# Patient Record
Sex: Female | Born: 1955 | Race: Black or African American | Hispanic: No | Smoking: Former smoker
Health system: Southern US, Community
[De-identification: ages and names within clinical notes are randomized; demographics above are authoritative.]

## PROBLEM LIST (undated history)

## (undated) DIAGNOSIS — E559 Vitamin D deficiency, unspecified: Secondary | ICD-10-CM

## (undated) DIAGNOSIS — K5790 Diverticulosis of intestine, part unspecified, without perforation or abscess without bleeding: Secondary | ICD-10-CM

## (undated) DIAGNOSIS — B009 Herpesviral infection, unspecified: Secondary | ICD-10-CM

## (undated) DIAGNOSIS — E785 Hyperlipidemia, unspecified: Secondary | ICD-10-CM

## (undated) DIAGNOSIS — IMO0001 Reserved for inherently not codable concepts without codable children: Secondary | ICD-10-CM

## (undated) DIAGNOSIS — Z973 Presence of spectacles and contact lenses: Secondary | ICD-10-CM

## (undated) DIAGNOSIS — I10 Essential (primary) hypertension: Secondary | ICD-10-CM

## (undated) DIAGNOSIS — R3129 Other microscopic hematuria: Secondary | ICD-10-CM

## (undated) DIAGNOSIS — Z90710 Acquired absence of both cervix and uterus: Secondary | ICD-10-CM

## (undated) DIAGNOSIS — G479 Sleep disorder, unspecified: Secondary | ICD-10-CM

## (undated) DIAGNOSIS — N959 Unspecified menopausal and perimenopausal disorder: Secondary | ICD-10-CM

## (undated) DIAGNOSIS — R0602 Shortness of breath: Secondary | ICD-10-CM

## (undated) DIAGNOSIS — K5909 Other constipation: Secondary | ICD-10-CM

## (undated) DIAGNOSIS — Z9289 Personal history of other medical treatment: Secondary | ICD-10-CM

## (undated) DIAGNOSIS — Z87891 Personal history of nicotine dependence: Secondary | ICD-10-CM

## (undated) HISTORY — DX: Hyperlipidemia, unspecified: E78.5

## (undated) HISTORY — DX: Presence of spectacles and contact lenses: Z97.3

## (undated) HISTORY — DX: Unspecified menopausal and perimenopausal disorder: N95.9

## (undated) HISTORY — DX: Sleep disorder, unspecified: G47.9

## (undated) HISTORY — DX: Personal history of other medical treatment: Z92.89

## (undated) HISTORY — DX: Other microscopic hematuria: R31.29

## (undated) HISTORY — DX: Herpesviral infection, unspecified: B00.9

## (undated) HISTORY — DX: Vitamin D deficiency, unspecified: E55.9

## (undated) HISTORY — DX: Personal history of nicotine dependence: Z87.891

## (undated) HISTORY — DX: Other constipation: K59.09

## (undated) HISTORY — DX: Reserved for inherently not codable concepts without codable children: IMO0001

## (undated) HISTORY — DX: Acquired absence of both cervix and uterus: Z90.710

## (undated) HISTORY — DX: Shortness of breath: R06.02

## (undated) HISTORY — DX: Diverticulosis of intestine, part unspecified, without perforation or abscess without bleeding: K57.90

---

## 1987-08-20 HISTORY — PX: PARTIAL HYSTERECTOMY: SHX80

## 1988-08-19 HISTORY — PX: ECTOPIC PREGNANCY SURGERY: SHX613

## 1998-09-27 ENCOUNTER — Ambulatory Visit (HOSPITAL_COMMUNITY): Admission: RE | Admit: 1998-09-27 | Discharge: 1998-09-27 | Payer: Self-pay | Admitting: Gastroenterology

## 1998-09-27 LAB — HM COLONOSCOPY

## 1998-10-04 ENCOUNTER — Encounter: Payer: Self-pay | Admitting: Family Medicine

## 1998-10-04 ENCOUNTER — Ambulatory Visit (HOSPITAL_COMMUNITY): Admission: RE | Admit: 1998-10-04 | Discharge: 1998-10-04 | Payer: Self-pay | Admitting: Family Medicine

## 1999-10-01 ENCOUNTER — Encounter: Payer: Self-pay | Admitting: Family Medicine

## 1999-10-01 ENCOUNTER — Encounter: Admission: RE | Admit: 1999-10-01 | Discharge: 1999-10-01 | Payer: Self-pay | Admitting: Family Medicine

## 1999-11-06 ENCOUNTER — Other Ambulatory Visit: Admission: RE | Admit: 1999-11-06 | Discharge: 1999-11-06 | Payer: Self-pay | Admitting: Urology

## 2000-10-09 ENCOUNTER — Other Ambulatory Visit: Admission: RE | Admit: 2000-10-09 | Discharge: 2000-10-09 | Payer: Self-pay | Admitting: Family Medicine

## 2001-02-28 ENCOUNTER — Emergency Department (HOSPITAL_COMMUNITY): Admission: EM | Admit: 2001-02-28 | Discharge: 2001-02-28 | Payer: Self-pay | Admitting: Emergency Medicine

## 2001-02-28 ENCOUNTER — Encounter: Payer: Self-pay | Admitting: Emergency Medicine

## 2001-10-13 ENCOUNTER — Other Ambulatory Visit: Admission: RE | Admit: 2001-10-13 | Discharge: 2001-10-13 | Payer: Self-pay | Admitting: Family Medicine

## 2002-11-01 ENCOUNTER — Other Ambulatory Visit: Admission: RE | Admit: 2002-11-01 | Discharge: 2002-11-01 | Payer: Self-pay | Admitting: Family Medicine

## 2004-11-27 ENCOUNTER — Ambulatory Visit (HOSPITAL_COMMUNITY): Admission: RE | Admit: 2004-11-27 | Discharge: 2004-11-27 | Payer: Self-pay | Admitting: Gastroenterology

## 2005-08-01 ENCOUNTER — Other Ambulatory Visit: Admission: RE | Admit: 2005-08-01 | Discharge: 2005-08-01 | Payer: Self-pay | Admitting: Family Medicine

## 2007-10-11 ENCOUNTER — Emergency Department (HOSPITAL_COMMUNITY): Admission: EM | Admit: 2007-10-11 | Discharge: 2007-10-11 | Payer: Self-pay | Admitting: Family Medicine

## 2007-10-15 ENCOUNTER — Emergency Department (HOSPITAL_COMMUNITY): Admission: EM | Admit: 2007-10-15 | Discharge: 2007-10-15 | Payer: Self-pay | Admitting: Family Medicine

## 2008-10-13 ENCOUNTER — Emergency Department (HOSPITAL_COMMUNITY): Admission: EM | Admit: 2008-10-13 | Discharge: 2008-10-13 | Payer: Self-pay | Admitting: Emergency Medicine

## 2009-04-01 ENCOUNTER — Emergency Department (HOSPITAL_COMMUNITY): Admission: EM | Admit: 2009-04-01 | Discharge: 2009-04-01 | Payer: Self-pay | Admitting: Emergency Medicine

## 2010-11-20 ENCOUNTER — Encounter: Payer: Self-pay | Admitting: Family Medicine

## 2010-11-27 LAB — HM MAMMOGRAPHY: HM Mammogram: NEGATIVE

## 2011-02-28 ENCOUNTER — Encounter: Payer: Self-pay | Admitting: Family Medicine

## 2011-04-01 ENCOUNTER — Encounter: Payer: Self-pay | Admitting: Family Medicine

## 2011-05-06 ENCOUNTER — Encounter: Payer: Self-pay | Admitting: Medical

## 2011-05-07 ENCOUNTER — Encounter: Payer: Self-pay | Admitting: Medical

## 2011-05-07 ENCOUNTER — Ambulatory Visit (INDEPENDENT_AMBULATORY_CARE_PROVIDER_SITE_OTHER): Payer: BC Managed Care – PPO | Admitting: Medical

## 2011-05-07 DIAGNOSIS — E785 Hyperlipidemia, unspecified: Secondary | ICD-10-CM

## 2011-05-07 DIAGNOSIS — B009 Herpesviral infection, unspecified: Secondary | ICD-10-CM

## 2011-05-07 DIAGNOSIS — N959 Unspecified menopausal and perimenopausal disorder: Secondary | ICD-10-CM

## 2011-05-07 DIAGNOSIS — R030 Elevated blood-pressure reading, without diagnosis of hypertension: Secondary | ICD-10-CM

## 2011-05-07 DIAGNOSIS — Z0000 Encounter for general adult medical examination without abnormal findings: Secondary | ICD-10-CM

## 2011-05-07 DIAGNOSIS — F17200 Nicotine dependence, unspecified, uncomplicated: Secondary | ICD-10-CM

## 2011-05-07 LAB — CBC WITH DIFFERENTIAL/PLATELET
Basophils Absolute: 0 10*3/uL (ref 0.0–0.1)
Basophils Relative: 0 % (ref 0–1)
Eosinophils Absolute: 0.4 10*3/uL (ref 0.0–0.7)
Eosinophils Relative: 5 % (ref 0–5)
HCT: 39.8 % (ref 36.0–46.0)
MCH: 28.9 pg (ref 26.0–34.0)
MCHC: 32.9 g/dL (ref 30.0–36.0)
MCV: 87.7 fL (ref 78.0–100.0)
Monocytes Absolute: 0.5 10*3/uL (ref 0.1–1.0)
Platelets: 232 10*3/uL (ref 150–400)
RDW: 13.7 % (ref 11.5–15.5)

## 2011-05-07 LAB — COMPREHENSIVE METABOLIC PANEL
ALT: <8 U/L (ref 0–35)
AST: 12 U/L (ref 0–37)
CO2: 26 mEq/L (ref 19–32)
Chloride: 106 mEq/L (ref 96–112)
Creat: 0.55 mg/dL (ref 0.50–1.10)
Sodium: 141 mEq/L (ref 135–145)
Total Bilirubin: 0.4 mg/dL (ref 0.3–1.2)
Total Protein: 7 g/dL (ref 6.0–8.3)

## 2011-05-07 LAB — POCT URINALYSIS DIPSTICK
Glucose, UA: NEGATIVE
Ketones, UA: NEGATIVE
Leukocytes, UA: NEGATIVE
Spec Grav, UA: 1.01
Urobilinogen, UA: NEGATIVE

## 2011-05-07 LAB — LIPID PANEL
LDL Cholesterol: 141 mg/dL — ABNORMAL HIGH (ref 0–99)
Triglycerides: 65 mg/dL (ref <150)
VLDL: 13 mg/dL (ref 0–40)

## 2011-05-07 LAB — POC HEMOCCULT BLD/STL (OFFICE/1-CARD/DIAGNOSTIC): Fecal Occult Blood, POC: NEGATIVE

## 2011-05-07 MED ORDER — CLONIDINE HCL 0.1 MG PO TABS: 0.1000 mg | ORAL_TABLET | Freq: Every day | ORAL | Status: DC

## 2011-05-07 MED ORDER — VALACYCLOVIR HCL 1 G PO TABS: 500.0000 mg | ORAL_TABLET | Freq: Two times a day (BID) | ORAL | Status: DC

## 2011-05-07 NOTE — Progress Notes (Signed)
Subjective:   HPI  Holly [name] is a 55 y.o. female who presents for a complete physical.  She is a new patient today.  She was last seeing Dr. Sheran Fava before the [facility] practice closed.   She has been out of her medication for months.     She request referral to GI.  Was suppose to have repeat colonoscopy last year.  Her last colonoscopy was normal in 2006 per patient, but says they were going to repeat 2011.  Last was through Dr. Loreta Ave.   In general she has been in her normal state of health.  She was on estrogen patch for menopause.  She has bad hot flashes, worse at night.  She is currently taking some black cohosh OTC.  No other c/o.   Reviewed their medical, surgical, family, social, medication, and allergy history and updated chart as appropriate.  Past Medical History  Diagnosis Date  . Hyperlipidemia   . Wears glasses   . Tobacco use disorder   . Herpetic lesion     hx/o herpetic lesion right hand and left ear?  . Menopausal disorder     Past Surgical History  Procedure Date  . Partial hysterectomy 1989    still has 1 ovary  . Ectopic pregnancy surgery 1990    Family History  Problem Relation Age of Onset  . Hyperlipidemia Mother   . Hypertension Mother   . Hyperlipidemia Father   . Hypertension Father   . Stroke Brother     during surgery  . Cancer Neg Hx   . Heart disease Neg Hx   . Diabetes Neg Hx     History   Social History  . Marital Status: Married    Spouse Name: N/A    Number of Children: N/A  . Years of Education: N/A   Occupational History  . Not on file.   Social History Main Topics  . Smoking status: Current Everyday Smoker -- 0.5 packs/day    Types: Cigarettes  . Smokeless tobacco: Never Used  . Alcohol Use: No  . Drug Use: No  . Sexually Active: Not on file     married, 2 chlidren, school bus driver, exercise with walking   Other Topics Concern  . Not on file   Social History Narrative  . No narrative on file     Current Outpatient Prescriptions on File Prior to Visit  Medication Sig Dispense Refill  . calcium-vitamin D (OSCAL WITH D) 500-200 MG-UNIT per tablet Take 1 tablet by mouth 2 (two) times daily.        . valACYclovir (VALTREX) 1000 MG tablet Take 500 mg by mouth 2 (two) times daily.          Allergies  Allergen Reactions  . Penicillins      Review of Systems Constitutional: +hot flashes, 20lb weight loss in last several months, intentional; denies fever, chills, anorexia, fatigue Allergy: negative; denies recent sneezing, itching, congestion Dermatology: denies changing moles, rash, lumps, new worrisome lesions ENT: no runny nose, ear pain, sore throat, hoarseness, sinus pain, teeth pain, tinnitus, hearing loss, epistaxis Cardiology: denies chest pain, palpitations, edema, orthopnea, paroxysmal nocturnal dyspnea Respiratory: denies cough, shortness of breath, dyspnea on exertion, wheezing, hemoptysis Gastroenterology: +occasional constipation; denies abdominal pain, nausea, vomiting, diarrhea, blood in stool, changes in bowel movement, dysphagia Hematology: denies bleeding or bruising problems Musculoskeletal: denies arthralgias, myalgias, joint swelling, back pain, neck pain, cramping, gait changes Ophthalmology: denies vision changes, eye redness, itching, discharge Urology: denies  dysuria, difficulty urinating, hematuria, urinary frequency, urgency, incontinence Neurology: no headache, weakness, tingling, numbness, speech abnormality, memory loss, falls, dizziness Psychology: denies depressed mood, agitation, sleep problems Gyn: no breast pain, discharge,no vaginal c/o     Objective:   Physical Exam  Filed Vitals:   05/07/11 1041  BP: 132/90  Pulse: 64  Temp: 98 F (36.7 C)  Resp: 20    General appearance: alert, no distress, WD/WN, black female Skin:left external ear with few amorphous scars that pt attributes to herpetic lesion prior, right orbit laterally with  3mm raised brown papule, and right chin with raised 3mm papule, brown, uniform, both unchanged for many years per pt HEENT: normocephalic, conjunctiva/corneas normal, sclerae anicteric, PERRLA, EOMi, nares patent, no discharge or erythema, pharynx normal Oral cavity: MMM, tongue normal, teeth in good repair Neck: supple, no lymphadenopathy, no thyromegaly, no masses, normal ROM, no bruits Chest: non tender, normal shape and expansion Heart: RRR, normal S1, S2, no murmurs Lungs: CTA bilaterally, no wheezes, rhonchi, or rales Abdomen: +bs, soft, non tender, non distended, no masses, no hepatomegaly, no splenomegaly, no bruits Back: non tender, normal ROM, no scoliosis Musculoskeletal: upper extremities non tender, no obvious deformity, normal ROM throughout, lower extremities non tender, no obvious deformity, normal ROM throughout Extremities: no edema, no cyanosis, no clubbing Pulses: 2+ symmetric, upper and lower extremities, normal cap refill Neurological: alert, oriented x 3, CN2-12 intact, strength normal upper extremities and lower extremities, sensation normal throughout, DTRs 2+ throughout, no cerebellar signs, gait normal Psychiatric: normal affect, behavior normal, pleasant  Breasts: nontender, no mass, no skin changes, no axillary lymphadenopathy Gyn: normal female external genitalia, s/p hysterectomy, no adnexal mass or tenderness, no significant atrophic changes; rectal - small external hemorrhoid, otherwise occult brown stool, otherwise unremarkable exam, exam chaperoned by nurse   Assessment :    Encounter Diagnoses  Name Primary?  . General medical examination Yes  . Tobacco use disorder   . Hyperlipidemia   . Elevated blood pressure reading without diagnosis of hypertension   . Herpetic lesion   . Menopausal disorder       Plan:    Physical exam - discussed healthy lifestyle, diet, exercise, preventative care, vaccinations, and addressed their concerns.  Declines flu  shot today.   Last tetanus booster 2009.  Bone density scan 2012, normal normal per pt.   Mammogram 11/27/10 normal.  Will review prior records.   Tobacco use disorder - discussed dangers of smoking, advised she stop tobacco use.  Hyperlipidemia - labs today.  Note, she lost 20 lb intentionally over the past several months and hops that her numbers will be normal so she doesn't have to take medication.   Elevated BP - recheck with nurse in 2wk.  Herpetic lesion - will review prior records.   Menopausal disorder - discussed options.  I advised she avoid hormone patch given risks including her tobacco use.  Will use trial of Clonidine QHS. Recheck 42mo.  Discussed other strategies to deal with menopausal symptoms, hot flashes.  Repeat first morning UA in 2wk.  Reviewed prior records which in additional to notations above, she has hx/o vit D deficiency, chronic hematuria with negative urologic workup, genital herpes, and discoid lupus.

## 2011-05-08 ENCOUNTER — Encounter: Payer: Self-pay | Admitting: *Deleted

## 2011-05-08 ENCOUNTER — Telehealth: Payer: Self-pay | Admitting: *Deleted

## 2011-05-08 ENCOUNTER — Other Ambulatory Visit: Payer: Self-pay | Admitting: Medical

## 2011-05-08 ENCOUNTER — Encounter: Payer: Self-pay | Admitting: Medical

## 2011-05-08 MED ORDER — PRAVASTATIN SODIUM 40 MG PO TABS: 40.0000 mg | ORAL_TABLET | Freq: Every evening | ORAL | Status: DC

## 2011-05-08 MED ORDER — ASPIRIN EC 81 MG PO TBEC: 81.0000 mg | DELAYED_RELEASE_TABLET | Freq: Every day | ORAL | Status: DC

## 2011-05-08 NOTE — Telephone Encounter (Addendum)
Message copied by Dorthula Perfect on Wed May 08, 2011  9:24 AM ------      Message from: Aleen Campi, DAVID S      Created: Wed May 08, 2011  7:35 AM       pls pick up letter to patient on Dr. Delford Field printer.  Stamp my signature.  Please call pt about results as noted on the letter.              Carefully review with her the medication changes noted in the letter (aspirin, cholesterol medication, clonidine, vitamins).   Pt was notified and letter was read to pt.  Pt understood and agreed.   Pt will call back to schedule an appointment for 2 week f/u on ua and 1 month follow up on BP, Hotflashes and to stop smoking.  Pt agreed to take asa, cholesterol medication and clonidine and vitamins.  Send letter to pt.  CM, LPN

## 2011-05-28 ENCOUNTER — Other Ambulatory Visit (INDEPENDENT_AMBULATORY_CARE_PROVIDER_SITE_OTHER): Payer: BC Managed Care – PPO

## 2011-05-28 DIAGNOSIS — N390 Urinary tract infection, site not specified: Secondary | ICD-10-CM

## 2011-05-28 LAB — POCT URINALYSIS DIPSTICK
Bilirubin, UA: NEGATIVE
Blood, UA: NEGATIVE
Glucose, UA: NEGATIVE
Nitrite, UA: NEGATIVE
Spec Grav, UA: 1.015

## 2011-06-17 ENCOUNTER — Ambulatory Visit: Payer: BC Managed Care – PPO | Admitting: Medical

## 2011-07-10 ENCOUNTER — Encounter: Payer: Self-pay | Admitting: Family Medicine

## 2011-07-14 ENCOUNTER — Other Ambulatory Visit: Payer: Self-pay | Admitting: Medical

## 2011-07-16 ENCOUNTER — Other Ambulatory Visit: Payer: Self-pay | Admitting: Medical

## 2012-01-14 ENCOUNTER — Encounter: Payer: Self-pay | Admitting: Medical

## 2012-01-27 ENCOUNTER — Ambulatory Visit
Admission: RE | Admit: 2012-01-27 | Discharge: 2012-01-27 | Disposition: A | Discharge status: Home / Self Care | Payer: BC Managed Care – PPO | Source: Ambulatory Visit | Attending: Medical | Admitting: Medical

## 2012-01-27 ENCOUNTER — Encounter: Payer: Self-pay | Admitting: Medical

## 2012-01-27 ENCOUNTER — Ambulatory Visit (INDEPENDENT_AMBULATORY_CARE_PROVIDER_SITE_OTHER): Payer: BC Managed Care – PPO | Admitting: Medical

## 2012-01-27 VITALS — BP 118/72 | HR 76 | Temp 98.6°F | Ht 64.0 in | Wt 144.0 lb

## 2012-01-27 DIAGNOSIS — M549 Dorsalgia, unspecified: Secondary | ICD-10-CM

## 2012-01-27 DIAGNOSIS — R202 Paresthesia of skin: Secondary | ICD-10-CM

## 2012-01-27 DIAGNOSIS — R209 Unspecified disturbances of skin sensation: Secondary | ICD-10-CM

## 2012-01-27 DIAGNOSIS — R200 Anesthesia of skin: Secondary | ICD-10-CM

## 2012-01-27 MED ORDER — CYCLOBENZAPRINE HCL 5 MG PO TABS: ORAL_TABLET | ORAL | Status: DC

## 2012-01-27 MED ORDER — DICLOFENAC SODIUM 75 MG PO TBEC: 75.0000 mg | DELAYED_RELEASE_TABLET | Freq: Two times a day (BID) | ORAL | Status: DC

## 2012-01-27 NOTE — Progress Notes (Signed)
  Subjective:   HPI  Holly [name] is a 56 y.o. female who presents for c/o back pain.  She note 4-5 mo hx/o upper back pain.  She also notes intermittent numbness and tingling that runs down the right arm from shoulder to hand.  The numbness is not constant, but seems to flare up at times.   The pain pain is also intermittent.  She notes spasms of the muscle. The symptoms are worse when she has been on her feet all day working.  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  She denies injury, trauma, fall.  The pain is relieved by rest.  The pain can be up to 8/10.  The pain does not awaken her.  Uses tylenol about 4 days per week.   No other aggravating or relieving factors.    No other c/o.  The following portions of the patient's history were reviewed and updated as appropriate: allergies, current medications, past family history, past medical history, past social history, past surgical history and problem list.  Past Medical History  Diagnosis Date  . Hyperlipidemia   . Wears glasses   . Tobacco use disorder   . Herpetic lesion     hx/o herpetic lesion right hand and left ear?  . Menopausal disorder     Allergies  Allergen Reactions  . Penicillins Rash   Review of Systems Constitutional: -fever, -chills, -sweats, -unexpected -weight change,-fatigue Cardiology:  -chest pain, -palpitations, -edema Respiratory: -cough, -shortness of breath, -wheezing Gastroenterology: -abdominal pain, -nausea, -vomiting, -diarrhea, -constipation  Urology: -dysuria, -difficulty urinating, -hematuria, -urinary frequency, -urgency Neurology: -headache, -weakness    Objective:   Physical Exam  General appearance: alert, no distress, WD/WN Back: nontender, no scoliosis, no obvious deformity, ROM WNL Neck: nontender, normal ROM MSK: UE nontender, normal ROM, no obvious deformity Pulses: 2+ symmetric, upper and lower  extremities, normal cap refill Neuro: normal UE strength, sensation and DTRs.  Assessment and Plan :     Encounter Diagnoses  Name Primary?  . Back pain Yes  . Numbness and tingling in left arm    Xrays today.   Discussed potential etiologies, but she does have radicular symptoms that are mild.  Advised relative rest, advised daily stretching routine and exercise as we discussed, scripts for Flexeril prn, Voltaren prn BID, and will send for C and T spine xrays today.  Consider massage or physical therapy.

## 2012-05-23 ENCOUNTER — Other Ambulatory Visit: Payer: Self-pay | Admitting: Medical

## 2012-05-25 NOTE — Telephone Encounter (Signed)
PATIENT NEEDS A OFFICE VISIT BEFORE HER RX RUNS OUT.

## 2012-06-23 ENCOUNTER — Ambulatory Visit (INDEPENDENT_AMBULATORY_CARE_PROVIDER_SITE_OTHER): Payer: BC Managed Care – PPO | Admitting: Medical

## 2012-06-23 ENCOUNTER — Encounter: Payer: Self-pay | Admitting: Medical

## 2012-06-23 VITALS — BP 100/80 | HR 68 | Temp 98.2°F | Resp 16 | Ht 64.0 in | Wt 154.0 lb

## 2012-06-23 DIAGNOSIS — Z23 Encounter for immunization: Secondary | ICD-10-CM

## 2012-06-23 DIAGNOSIS — E559 Vitamin D deficiency, unspecified: Secondary | ICD-10-CM

## 2012-06-23 DIAGNOSIS — F17200 Nicotine dependence, unspecified, uncomplicated: Secondary | ICD-10-CM

## 2012-06-23 DIAGNOSIS — Z1211 Encounter for screening for malignant neoplasm of colon: Secondary | ICD-10-CM

## 2012-06-23 DIAGNOSIS — Z0000 Encounter for general adult medical examination without abnormal findings: Secondary | ICD-10-CM

## 2012-06-23 DIAGNOSIS — R3129 Other microscopic hematuria: Secondary | ICD-10-CM

## 2012-06-23 DIAGNOSIS — K5900 Constipation, unspecified: Secondary | ICD-10-CM

## 2012-06-23 DIAGNOSIS — E785 Hyperlipidemia, unspecified: Secondary | ICD-10-CM

## 2012-06-23 LAB — COMPREHENSIVE METABOLIC PANEL
ALT: <8 U/L (ref 0–35)
Albumin: 4 g/dL (ref 3.5–5.2)
CO2: 29 mEq/L (ref 19–32)
Calcium: 9.3 mg/dL (ref 8.4–10.5)
Chloride: 107 mEq/L (ref 96–112)
Glucose, Bld: 69 mg/dL — ABNORMAL LOW (ref 70–99)
Sodium: 142 mEq/L (ref 135–145)
Total Protein: 6.7 g/dL (ref 6.0–8.3)

## 2012-06-23 LAB — LIPID PANEL
HDL: 44 mg/dL (ref >39)
Total CHOL/HDL Ratio: 3.1 Ratio
VLDL: 14 mg/dL (ref 0–40)

## 2012-06-23 LAB — POCT URINALYSIS DIPSTICK
Bilirubin, UA: NEGATIVE
Ketones, UA: NEGATIVE
Leukocytes, UA: NEGATIVE
Spec Grav, UA: 1.01
pH, UA: 7

## 2012-06-23 NOTE — Patient Instructions (Signed)
Begin Psyllium or Miralax once daily OTC for constipation.  Increase fiber and water intake.    Check your insurance about deductible and copay as we need to update your colonoscopy.

## 2012-06-23 NOTE — Progress Notes (Signed)
Subjective:   HPI  Holly [name] is a 56 y.o. female who presents for a complete physical.  Preventative care: Last ophthalmology visit:09/2011 Last dental visit:dr. goodman Last colonoscopy2000 Last mammogram:12/2011 Last gynecological exam:03/2012 Last EKG:n/a Last labs:04/2011  Prior vaccinations: TD or Tdap:n/a Influenza:not to vaccine Pneumococcal:n/a Shingles/Zostavax:n/a Other: n/a   Advanced directive:n/a Health care power of attorney:n/a Living will:n/a  Concerns: Constipation.  Occasional RLQ discomfort.  Not having BMs daily.  Using OTC herbal remedy for constipation.   Reviewed their medical, surgical, family, social, medication, and allergy history and updated chart as appropriate.   Past Medical History  Diagnosis Date  . Hyperlipidemia   . Wears glasses   . Tobacco use disorder   . Herpetic lesion     hx/o herpetic lesion right hand and left ear? gets once to twice yearly  . Menopausal disorder   . Vitamin D deficiency   . History of mammogram 12/2011    normal    Past Surgical History  Procedure Date  . Partial hysterectomy 1989    still has 1 ovary; hx/o uterine fibroids  . Ectopic pregnancy surgery 1990  . Colonoscopy 08/1998    Dr. Loreta Ave, consult 11/2008, but hasn't repeated colonoscopy yet    Family History  Problem Relation Age of Onset  . Hyperlipidemia Mother   . Hypertension Mother   . Diabetes Mother   . Hyperlipidemia Father   . Hypertension Father   . Diabetes Father   . Stroke Brother     during surgery  . Cancer Neg Hx   . Heart disease Neg Hx     History   Social History  . Marital Status: Married    Spouse Name: N/A    Number of Children: N/A  . Years of Education: N/A   Occupational History  . Not on file.   Social History Main Topics  . Smoking status: Current Every Day Smoker -- 0.5 packs/day for 15 years    Types: Cigarettes  . Smokeless tobacco: Never Used  . Alcohol Use: No  . Drug Use: No  .  Sexually Active: Not on file     Comment: married, 2 chlidren, school bus driver, exercise with walking   Other Topics Concern  . Not on file   Social History Narrative   Bus driver, exercise with walking, married, has 2 children, ages 57yo and 100yo, both live in [place], 3 grandchildren    Current Outpatient Prescriptions on File Prior to Visit  Medication Sig Dispense Refill  . calcium-vitamin D (OSCAL WITH D) 500-200 MG-UNIT per tablet Take 1 tablet by mouth 2 (two) times daily.        . cloNIDine (CATAPRES) 0.1 MG tablet take 1 tablet by mouth at bedtime  30 tablet  5  . pravastatin (PRAVACHOL) 40 MG tablet take 1 tablet by mouth every evening  30 tablet  0  . valACYclovir (VALTREX) 1000 MG tablet Take 500 mg by mouth 2 (two) times daily as needed.      . cyclobenzaprine (FLEXERIL) 5 MG tablet 1 tablet po QHS or up to BID prn spasms  30 tablet  0  . diclofenac (VOLTAREN) 75 MG EC tablet Take 1 tablet (75 mg total) by mouth 2 (two) times daily.  30 tablet  0    Allergies  Allergen Reactions  . Penicillins Rash     Review of Systems Constitutional: -fever, -chills, -sweats, +unexpected weight change, -decreased appetite, -fatigue Allergy: -sneezing, -itching, -congestion Dermatology: -changing moles, --rash, -  lumps ENT: -runny nose, -ear pain, -sore throat, -hoarseness, -sinus pain, -teeth pain, - ringing in ears, -hearing loss, -nosebleeds Cardiology: -chest pain, -palpitations, -swelling, -difficulty breathing when lying flat, -waking up short of breath Respiratory: -cough, -shortness of breath, -difficulty breathing with exercise or exertion, -wheezing, -coughing up blood Gastroenterology: -abdominal pain, -nausea, -vomiting, -diarrhea, +constipation, -blood in stool, -changes in bowel movement, -difficulty swallowing or eating Hematology: +bleeding, -bruising  Musculoskeletal: -joint aches, -muscle aches, -joint swelling, -back pain, -neck pain, -cramping, -changes in  gait Ophthalmology: denies vision changes, eye redness, itching, discharge Urology: -burning with urination, -difficulty urinating, -blood in urine, -urinary frequency, -urgency, -incontinence Neurology: -headache, -weakness, -tingling, -numbness, -memory loss, -falls, -dizziness Psychology: -depressed mood, -agitation, -sleep problems     Objective:   Physical Exam  There were no vitals filed for this visit.  General appearance: alert, no distress, WD/WN, AA female Skin: distal 1/3 of nose with small 2mm depression, chronic scar from chicken pox, right lateral orbit and right lateral chin each with 3mm raised brown uniform papular lesion unchanged for years per pt.  No new worrisome lesions. HEENT: normocephalic, conjunctiva/corneas normal, sclerae anicteric, PERRLA, EOMi, nares patent, no discharge or erythema, pharynx normal Oral cavity: MMM, tongue normal, teeth with some stain, otherwise in good repair Neck: supple, no lymphadenopathy, no thyromegaly, no masses, normal ROM, no bruits Chest: non tender, normal shape and expansion Heart: RRR, normal S1, S2, no murmurs Lungs: CTA bilaterally, no wheezes, rhonchi, or rales Abdomen: +bs, soft, mild RLQ tenderness, otherwise non tender, non distended, no masses, no hepatomegaly, no splenomegaly, no bruits Back: non tender, normal ROM, no scoliosis Musculoskeletal: upper extremities non tender, no obvious deformity, normal ROM throughout, lower extremities non tender, no obvious deformity, normal ROM throughout Extremities: no edema, no cyanosis, no clubbing Pulses: 2+ symmetric, upper and lower extremities, normal cap refill Neurological: alert, oriented x 3, CN2-12 intact, strength normal upper extremities and lower extremities, sensation normal throughout, DTRs 2+ throughout, no cerebellar signs, gait normal Psychiatric: normal affect, behavior normal, pleasant  Breast: nontender, no masses or lumps, no skin changes, no nipple discharge  or inversion, no axillary lymphadenopathy Gyn: Normal external genitalia without lesions, vagina with normal mucosa, no abnormal vaginal discharge.  Adnexa not enlarged, nontender, no masses.  S/p hysterectomy. Exam chaperoned by nurse. Rectal: anus normal, normal tone, occult negative stool   Adult ECG Report  Indication: physical  Rate: 62bpm  Rhythm: normal sinus rhythm  QRS Axis: -4 degrees  PR Interval:  QRS Duration: 82ms  QTc:  Conduction Disturbances: none  Other Abnormalities: none  Patient's cardiac risk factors are: dyslipidemia and smoking/ tobacco exposure.  EKG comparison: none  Narrative Interpretation: left axis deviation, otherwise normal EKG.    Assessment and Plan :    Encounter Diagnoses  Name Primary?  . Routine general medical examination at a health care facility Yes  . Hematuria, microscopic   . Tobacco use disorder   . Need for pneumococcal vaccination   . Constipation   . Screening for colon cancer   . Vitamin D deficiency   . Hyperlipidemia     Physical exam - discussed healthy lifestyle, diet, exercise, preventative care, vaccinations, and addressed their concerns.  Handout given.  Declines flu vaccine.   Hematuria - given her longstanding microscopic hematuria, smoking hx/o and last eval with Urology likely 10+ years ago, will refer back to urology  Tobacco use - advised she stop smoking due to the risks of tobacco use.  Not ready to quit.   Pneumococcal vaccine, VIS and counseling given.   Constipation - increase water and fiber intake, begin OTC Miralax or Psyllium daily  Screen for colon cancer - due back for repeat. She will check with insurer and we will refer back to Dr. Pollyann Samples D deficiency - c/t OTC Vit D daily  Hyperlipidemia - labs today, c/t Pravachol 40mg  daily.   Follow-up pending labs

## 2012-06-24 LAB — VITAMIN D 25 HYDROXY (VIT D DEFICIENCY, FRACTURES): Vit D, 25-Hydroxy: 28 ng/mL — ABNORMAL LOW (ref 30–89)

## 2012-07-01 ENCOUNTER — Telehealth: Payer: Self-pay | Admitting: Family Medicine

## 2012-07-01 NOTE — Telephone Encounter (Signed)
PATIENT IS AWARE OF HER APPOINTMENT WITH DR. MANN ON 08/20/12 @ 900 AM. CLS

## 2012-07-21 ENCOUNTER — Telehealth: Payer: Self-pay | Admitting: Family Medicine

## 2012-07-21 NOTE — Telephone Encounter (Signed)
PATIENT IS AWARE OF HER APPOINTMENT AT [facility] ON 08/18/12 @ 1100 AM WITH DR. MANNY. ALL INFORMATION HAS BEEN FAX OVER TO THERE OFFICE. CLS

## 2012-07-28 ENCOUNTER — Other Ambulatory Visit: Payer: Self-pay | Admitting: Medical

## 2012-07-31 ENCOUNTER — Ambulatory Visit (INDEPENDENT_AMBULATORY_CARE_PROVIDER_SITE_OTHER): Payer: BC Managed Care – PPO | Admitting: Family Medicine

## 2012-07-31 ENCOUNTER — Encounter: Payer: Self-pay | Admitting: Family Medicine

## 2012-07-31 VITALS — BP 126/80 | HR 88 | Temp 98.3°F

## 2012-07-31 DIAGNOSIS — R350 Frequency of micturition: Secondary | ICD-10-CM

## 2012-07-31 DIAGNOSIS — J019 Acute sinusitis, unspecified: Secondary | ICD-10-CM

## 2012-07-31 DIAGNOSIS — N390 Urinary tract infection, site not specified: Secondary | ICD-10-CM

## 2012-07-31 MED ORDER — SULFAMETHOXAZOLE-TRIMETHOPRIM 800-160 MG PO TABS: 1.0000 | ORAL_TABLET | Freq: Two times a day (BID) | ORAL | Status: DC

## 2012-07-31 NOTE — Progress Notes (Signed)
  Subjective:    Patient ID: Holly [name], female    DOB: November 17, 1955, 56 y.o.   MRN: [id number]  HPI She has an 8 day history this started with headache followed by sinus congestion on the PND, sore throat. No fever, chills, headache. She has been having difficulty with intermittent cough got worse yesterday. The PND became purulent 2 days ago. Approximately 4 days ago she complains of difficulty with dysuria, urgency and frequency to  Review of Systems     Objective:   Physical Exam alert and in no distress. Tympanic membranes and canals are normal. Throat is clear. Tonsils are normal. Neck is supple without adenopathy or thyromegaly. Cardiac exam shows a regular sinus rhythm without murmurs or gallops. Lungs are clear to auscultation. Nasal mucosa is slightly red. No tenderness over sinuses. Urine microscopic was positive for white cells and bacteria.       Assessment & Plan:   1. Frequent urination  POCT Urinalysis Dipstick  2. UTI (lower urinary tract infection)  sulfamethoxazole-trimethoprim (BACTRIM DS,SEPTRA DS) 800-160 MG per tablet  3. Acute sinusitis  sulfamethoxazole-trimethoprim (BACTRIM DS,SEPTRA DS) 800-160 MG per tablet   she is to call me if not entirely better at the end of the course of the antibiotic.

## 2012-07-31 NOTE — Patient Instructions (Signed)
Take all the antibiotic and if you have any difficulty when you finish give me a call. Use NyQuil at night for the coughing. Keep working on smoking

## 2012-08-04 LAB — POCT URINALYSIS DIPSTICK
Nitrite, UA: NEGATIVE
Urobilinogen, UA: NEGATIVE
pH, UA: 6

## 2012-08-05 ENCOUNTER — Telehealth: Payer: Self-pay | Admitting: Family Medicine

## 2012-08-05 MED ORDER — HYDROCOD POLST-CHLORPHEN POLST 10-8 MG/5ML PO LQCR: 5.0000 mL | Freq: Two times a day (BID) | ORAL | Status: DC | PRN

## 2012-08-05 NOTE — Telephone Encounter (Signed)
Call in Tussionex for her

## 2012-08-05 NOTE — Telephone Encounter (Signed)
Called in tusinex pt is aware of med called in

## 2012-08-26 ENCOUNTER — Other Ambulatory Visit: Payer: Self-pay | Admitting: Medical

## 2012-09-02 ENCOUNTER — Encounter: Payer: Self-pay | Admitting: Family Medicine

## 2012-09-02 ENCOUNTER — Ambulatory Visit (INDEPENDENT_AMBULATORY_CARE_PROVIDER_SITE_OTHER): Payer: BC Managed Care – PPO | Admitting: Family Medicine

## 2012-09-02 VITALS — BP 144/82 | HR 76 | Temp 98.0°F | Ht 65.0 in | Wt 156.0 lb

## 2012-09-02 DIAGNOSIS — R059 Cough, unspecified: Secondary | ICD-10-CM

## 2012-09-02 DIAGNOSIS — F17200 Nicotine dependence, unspecified, uncomplicated: Secondary | ICD-10-CM

## 2012-09-02 DIAGNOSIS — R05 Cough: Secondary | ICD-10-CM

## 2012-09-02 MED ORDER — BENZONATATE 200 MG PO CAPS: 200.0000 mg | ORAL_CAPSULE | Freq: Three times a day (TID) | ORAL | Status: DC | PRN

## 2012-09-02 NOTE — Patient Instructions (Signed)
I suspect that cough is related to allergies.  I do not see any evidence of bronchitis or bacterial infection at this time.  Return if you develop fevers, shortness of breath, chest pain, or discolored phlegm.    Use claritin or Zyrtec (if you know that zyrtec doesn't make you sleepy, okay to take that, otherwise take Claritin which is nonsedating).  Take this once daily.  Use plain guaifenesin (certain robitussins, or Mucinex (plain)) during the day, okay to use the medicine you have at home at bedtime.  Use the benzonatate during the day, just if needed for coughing.  Do not take ibuprofen along with voltaren.  Use tylenol for headaches instead  Please try and quit smoking.

## 2012-09-02 NOTE — Progress Notes (Signed)
Chief Complaint  Patient presents with  . Cough    started last Wednesday. Also has nasal congestion, head hurts when she coughs.    HPI:  She was sick last month with sinusitis, treated with Bactrim, and symptoms resolved.  She works at [employer], and was exposed to truck fumes (from [employer]), and later that night (3 nights ago) started with cough.  She has some runny nose.  She has spasms of cough, which lasts for 5 minutes, and causes headaches and burning in her chests.  Mucus from nose is clear.  Phlegm is also clear.  Cough is usually dry, occasionally gets up clear phlegm.  Denies fevers (just hot flashes). +sick contact (her mother) with URI and sinus problems.  She has been taking Robitussin cough and congestion--makes her sleepy, so only takes at night; helps at night.  Using ibuprofen for her headaches.  H/o seasonal allergies, hasn't need meds for this in years.  Never needed inhalers in the past.  Smokes about 5 cigarettes/day, although hasn't been smoking much since she started coughing.  Past Medical History  Diagnosis Date  . Hyperlipidemia   . Wears glasses   . Tobacco use disorder   . Herpetic lesion     hx/o herpetic lesion right hand and left ear? gets once to twice yearly  . Menopausal disorder   . Vitamin D deficiency   . History of mammogram 12/2011    normal   Past Surgical History  Procedure Date  . Partial hysterectomy 1989    still has 1 ovary; hx/o uterine fibroids  . Ectopic pregnancy surgery 1990  . Colonoscopy 08/1998    Dr. Loreta Ave, consult 11/2008, but hasn't repeated colonoscopy yet   History   Social History  . Marital Status: Married    Spouse Name: N/A    Number of Children: N/A  . Years of Education: N/A   Occupational History  . bus driver [employer]   Social History Main Topics  . Smoking status: Current Every Day Smoker -- 0.3 packs/day for 15 years    Types: Cigarettes  . Smokeless tobacco: Never Used  .  Alcohol Use: No  . Drug Use: No  . Sexually Active: Not on file     Comment: married, 2 chlidren, school bus driver, exercise with walking   Other Topics Concern  . Not on file   Social History Narrative   Bus driver, exercise with walking, married, has 2 children, ages 17yo and 64yo, both live in [place], 3 grandchildren    Current outpatient prescriptions:calcium-vitamin D (OSCAL WITH D) 500-200 MG-UNIT per tablet, Take 1 tablet by mouth 2 (two) times daily.  , Disp: , Rfl: ;  cloNIDine (CATAPRES) 0.1 MG tablet, take 1 tablet by mouth at bedtime, Disp: 30 tablet, Rfl: 5;  cyclobenzaprine (FLEXERIL) 5 MG tablet, 1 tablet po QHS or up to BID prn spasms, Disp: 30 tablet, Rfl: 0 diclofenac (VOLTAREN) 75 MG EC tablet, Take 1 tablet (75 mg total) by mouth 2 (two) times daily., Disp: 30 tablet, Rfl: 0;  pravastatin (PRAVACHOL) 40 MG tablet, take 1 tablet by mouth at bedtime, Disp: 30 tablet, Rfl: 3;  valACYclovir (VALTREX) 500 MG tablet, take 1 tablet by mouth twice a day, Disp: 60 tablet, Rfl: 2  Allergies  Allergen Reactions  . Penicillins Rash   ROS: Denies nausea, vomiting, diarrhea, abdominal pain, urinary complaints. Denies myalgias/joint pains, skin rashes, bleeding/bruising, ear pain, sore throat.  See HPI  PHYSICAL EXAM:  BP 144/82  Pulse 76  Temp 98 F (36.7 C) (Oral)  Ht 5\' 5"  (1.651 m)  Wt 156 lb (70.761 kg)  BMI 25.96 kg/m2 Well developed, pleasant female in no distress. Had coughing spell upon arrival (dry), thinks due to perfume nurse wearing, NOT typical of her symptoms HEENT:  PERRL, EOMI, conjunctiva clear.  TM's and EAC's normal. Nasal mucosa moderately edematous, pale, with clear mucus.  Sinuses nontender Neck: no lymphadenopathy or thyromegaly Heart: regular rate and rhythm Lungs: clear with good air movement.  No wheezes, rales, or ronchi Skin: no rash Psych: normal mood, affect Neuro: alert and oriented.  Normal cranial nerves, gait.  ASSESSMENT/PLAN: 1.  Cough  benzonatate (TESSALON) 200 MG capsule  2. Tobacco use disorder      I suspect that cough is related to allergies.  I do not see any evidence of bronchitis or bacterial infection at this time.  Return if you develop fevers, shortness of breath, chest pain, or discolored phlegm.    Use claritin or Zyrtec (if you know that zyrtec doesn't make you sleepy, okay to take that, otherwise take Claritin which is nonsedating).  Take this once daily.  Use plain guaifenesin (certain robitussins, or Mucinex (plain)) during the day, okay to use the medicine you have at home at bedtime.  Use the benzonatate during the day, just if needed for coughing.  Do not take ibuprofen along with voltaren.  NSAID precautions reviewed. Use tylenol for headaches instead  Counseled re: risks of smoking and available cessation options available to her if/when she is ready  Declined flu shot

## 2012-09-04 ENCOUNTER — Telehealth: Payer: Self-pay | Admitting: Medical

## 2012-09-04 ENCOUNTER — Other Ambulatory Visit: Payer: Self-pay | Admitting: Medical

## 2012-09-04 MED ORDER — HYDROCODONE-HOMATROPINE 5-1.5 MG/5ML PO SYRP: 5.0000 mL | ORAL_SOLUTION | Freq: Three times a day (TID) | ORAL | Status: DC | PRN

## 2012-09-04 NOTE — Telephone Encounter (Signed)
What are symptoms other than cough currently?  Does the tessalon make her sleepy?  I can refill tussionex but this can certainly cause drowsiness so can't use this during the day.

## 2012-09-04 NOTE — Telephone Encounter (Signed)
Patient states that she is still having the cough. She said she coughs all day long. 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 CLS

## 2012-12-11 ENCOUNTER — Telehealth: Payer: Self-pay | Admitting: Medical

## 2012-12-11 NOTE — Telephone Encounter (Signed)
LMOM TO CB. CLS 

## 2012-12-11 NOTE — Telephone Encounter (Signed)
Basically, she doesn't want to see Dr. Loreta Ave so I explain to her that I will try one day next week to schedule her an appointment at [facility]. CLS

## 2012-12-11 NOTE — Telephone Encounter (Signed)
I don't understand.   What is her insurance telling her about finance/coverage?  If she has already seen one GI doctor for consult, then it doesn't make sense to refer to a different GI doctor for same consult?   pls help

## 2012-12-11 NOTE — Telephone Encounter (Signed)
Was referred to Dr. Loreta Ave for colonoscopy but she was not able to afford. She wants to be referred to [facility], wants to have done after June 16 (after school is out)

## 2012-12-22 ENCOUNTER — Telehealth: Payer: Self-pay | Admitting: Family Medicine

## 2012-12-22 NOTE — Telephone Encounter (Signed)
Patient called saying that she did not like Dr. Elnoria Howard Dr. Loreta Ave office and that she didn't want to have her colonoscopy done over there. Patient states that she wants to be seen at [facility] for her screening colonoscopy. I called over to [facility] to try and schedule the appointment for her. The rep. Told me that since she has seen a GI doctor before she will need to go by DR. Mann/ Dr. Elnoria Howard office and release her records to [facility]. They will have to review her records first and then they will decided if she can schedule an appointment there. I called the patient and I explained to her the above information. Patient states that she understood and she will try and gt that taken care of. CLS  Referral is on hold until this is done. CLS

## 2013-01-17 DIAGNOSIS — K5790 Diverticulosis of intestine, part unspecified, without perforation or abscess without bleeding: Secondary | ICD-10-CM

## 2013-01-17 HISTORY — DX: Diverticulosis of intestine, part unspecified, without perforation or abscess without bleeding: K57.90

## 2013-01-17 HISTORY — PX: COLONOSCOPY: SHX174

## 2013-01-25 ENCOUNTER — Encounter: Payer: Self-pay | Admitting: Gastroenterology

## 2013-01-27 ENCOUNTER — Encounter: Payer: Self-pay | Admitting: Internal Medicine

## 2013-01-27 LAB — HM MAMMOGRAPHY: HM Mammogram: NEGATIVE

## 2013-02-03 ENCOUNTER — Telehealth: Payer: Self-pay | Admitting: Medical

## 2013-02-03 ENCOUNTER — Other Ambulatory Visit: Payer: Self-pay | Admitting: Medical

## 2013-02-03 MED ORDER — CLONIDINE HCL 0.1 MG PO TABS: 0.1000 mg | ORAL_TABLET | Freq: Every day | ORAL | Status: DC

## 2013-02-03 MED ORDER — PRAVASTATIN SODIUM 40 MG PO TABS: 40.0000 mg | ORAL_TABLET | Freq: Every day | ORAL | Status: DC

## 2013-02-03 NOTE — Telephone Encounter (Signed)
meds sent.  What happened with the urology f/u?  We have received note that she no showed for appt regarding persistent microscopic hematuria.

## 2013-02-04 NOTE — Telephone Encounter (Signed)
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

## 2013-02-04 NOTE — Telephone Encounter (Signed)
i believe pt knows urology # cause she said she was going to call herself. If not we can get it to her when she calls if need be

## 2013-02-04 NOTE — Telephone Encounter (Signed)
Reschedule with urology?  If so, then give her Urology contact info to call and set up appt

## 2013-02-08 ENCOUNTER — Ambulatory Visit (INDEPENDENT_AMBULATORY_CARE_PROVIDER_SITE_OTHER): Payer: BC Managed Care – PPO | Admitting: Gastroenterology

## 2013-02-08 ENCOUNTER — Encounter: Payer: Self-pay | Admitting: Gastroenterology

## 2013-02-08 VITALS — BP 108/80 | HR 80 | Ht 63.5 in | Wt 158.4 lb

## 2013-02-08 DIAGNOSIS — K5900 Constipation, unspecified: Secondary | ICD-10-CM | POA: Insufficient documentation

## 2013-02-08 DIAGNOSIS — K625 Hemorrhage of anus and rectum: Secondary | ICD-10-CM

## 2013-02-08 MED ORDER — NA SULFATE-K SULFATE-MG SULF 17.5-3.13-1.6 GM/177ML PO SOLN: 1.0000 | Freq: Once | ORAL | Status: DC

## 2013-02-08 NOTE — Progress Notes (Signed)
History of Present Illness: Pleasant 57 year old referred at the request of Dr. Aleen Campi for evaluation of rectal bleeding and constipation. On one occasion she had bright red blood mixed with his stool and in the water. She suffers from chronic constipation. Without the aid of a stimulant she feels that she will not have a bowel movement. With constipation she has abdominal discomfort. She has no prior history of bleeding. Colonoscopy in 2000 apparently was normal except for hemorrhoids. There  has been no change in medications or diet.    Past Medical History  Diagnosis Date  . Hyperlipidemia   . Wears glasses   . Tobacco use disorder   . Herpetic lesion     hx/o herpetic lesion right hand and left ear? gets once to twice yearly  . Menopausal disorder   . Vitamin D deficiency   . History of mammogram 12/2011    normal  . Abdominal pain   . Bloating   . Change in bowel habits   . Night sweats   . Sleep difficulties     due to menopause   Past Surgical History  Procedure Laterality Date  . Partial hysterectomy  1989    still has 1 ovary; hx/o uterine fibroids  . Ectopic pregnancy surgery  1990  . Colonoscopy  08/1998    Dr. Loreta Ave, consult 11/2008, but hasn't repeated colonoscopy yet   family history includes Diabetes in her father and mother; Hyperlipidemia in her father and mother; Hypertension in her father and mother; and Stroke in her brother.  There is no history of Cancer and Heart disease. Current Outpatient Prescriptions  Medication Sig Dispense Refill  . calcium-vitamin D (OSCAL WITH D) 500-200 MG-UNIT per tablet Take 1 tablet by mouth 2 (two) times daily.        . cloNIDine (CATAPRES) 0.1 MG tablet Take 1 tablet (0.1 mg total) by mouth at bedtime.  90 tablet  0  . cyclobenzaprine (FLEXERIL) 5 MG tablet 1 tablet po QHS or up to BID prn spasms  30 tablet  0  . HYDROcodone-homatropine (HYCODAN) 5-1.5 MG/5ML syrup Take 5 mLs by mouth every 8 (eight) hours as needed for cough.   120 mL  0  . OVER THE COUNTER MEDICATION Cleanse more one a day      . pravastatin (PRAVACHOL) 40 MG tablet Take 1 tablet (40 mg total) by mouth daily.  90 tablet  0  . valACYclovir (VALTREX) 500 MG tablet take 1 tablet by mouth twice a day  60 tablet  2   No current facility-administered medications for this visit.   Allergies as of 02/08/2013 - Review Complete 02/08/2013  Allergen Reaction Noted  . Penicillins Rash     reports that she has been smoking Cigarettes.  She has a 4.5 pack-year smoking history. She has never used smokeless tobacco. She reports that she does not drink alcohol or use illicit drugs.     Review of Systems: Pertinent positive and negative review of systems were noted in the above HPI section. All other review of systems were otherwise negative.  Vital signs were reviewed in today's medical record Physical Exam: General: Well developed , well nourished, no acute distress Skin: anicteric Head: Normocephalic and atraumatic Eyes:  sclerae anicteric, EOMI Ears: Normal auditory acuity Mouth: No deformity or lesions Neck: Supple, no masses or thyromegaly Lungs: Clear throughout to auscultation Heart: Regular rate and rhythm; no murmurs, rubs or bruits Abdomen: Soft, non tender and non distended. No masses, hepatosplenomegaly or hernias  noted. Normal Bowel sounds Rectal:deferred Musculoskeletal: Symmetrical with no gross deformities  Skin: No lesions on visible extremities Pulses:  Normal pulses noted Extremities: No clubbing, cyanosis, edema or deformities noted Neurological: Alert oriented x 4, grossly nonfocal Cervical Nodes:  No significant cervical adenopathy Inguinal Nodes: No significant inguinal adenopathy Psychological:  Alert and cooperative. Normal mood and affect

## 2013-02-08 NOTE — Patient Instructions (Addendum)

## 2013-02-08 NOTE — Assessment & Plan Note (Signed)
Limited rectal bleeding certainly may be related to hemorrhoids. A more proximal colonic bleeding source should be ruled out.  Recommendations #1 colonoscopy

## 2013-02-08 NOTE — Assessment & Plan Note (Addendum)
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  Recommendations #1 provided colonoscopy is negative for a significant structural abnormality, patient will consider enrollment in a constipation trial.

## 2013-02-11 ENCOUNTER — Encounter: Payer: Self-pay | Admitting: Gastroenterology

## 2013-02-11 ENCOUNTER — Ambulatory Visit (AMBULATORY_SURGERY_CENTER): Payer: BC Managed Care – PPO | Admitting: Gastroenterology

## 2013-02-11 VITALS — BP 151/85 | HR 62 | Temp 97.8°F | Resp 19 | Ht 63.0 in | Wt 158.0 lb

## 2013-02-11 DIAGNOSIS — K5900 Constipation, unspecified: Secondary | ICD-10-CM

## 2013-02-11 DIAGNOSIS — K648 Other hemorrhoids: Secondary | ICD-10-CM

## 2013-02-11 DIAGNOSIS — K5730 Diverticulosis of large intestine without perforation or abscess without bleeding: Secondary | ICD-10-CM

## 2013-02-11 DIAGNOSIS — K625 Hemorrhage of anus and rectum: Secondary | ICD-10-CM

## 2013-02-11 DIAGNOSIS — K6389 Other specified diseases of intestine: Secondary | ICD-10-CM

## 2013-02-11 MED ORDER — SODIUM CHLORIDE 0.9 % IV SOLN: 500.0000 mL | INTRAVENOUS | Status: DC

## 2013-02-11 NOTE — Progress Notes (Signed)
Patient did not experience any of the following events: a burn prior to discharge; a fall within the facility; wrong site/side/patient/procedure/implant event; or a hospital transfer or hospital admission upon discharge from the facility. (G8907) Patient did not have preoperative order for IV antibiotic SSI prophylaxis. (G8918)  

## 2013-02-11 NOTE — Patient Instructions (Addendum)
YOU HAD AN ENDOSCOPIC PROCEDURE TODAY AT THE [facility]: Refer to the procedure report that was given to you for any specific questions about what was found during the examination.  If the procedure report does not answer your questions, please call your gastroenterologist to clarify.  If you requested that your care partner not be given the details of your procedure findings, then the procedure report has been included in a sealed envelope for you to review at your convenience later.  YOU SHOULD EXPECT: Some feelings of bloating in the abdomen. Passage of more gas than usual.  Walking can help get rid of the air that was put into your GI tract during the procedure and reduce the bloating. If you had a lower endoscopy (such as a colonoscopy or flexible sigmoidoscopy) you may notice spotting of blood in your stool or on the toilet paper. If you underwent a bowel prep for your procedure, then you may not have a normal bowel movement for a few days.  DIET: Your first meal following the procedure should be a light meal and then it is ok to progress to your normal diet.  A half-sandwich or bowl of soup is an example of a good first meal.  Heavy or fried foods are harder to digest and may make you feel nauseous or bloated.  Likewise meals heavy in dairy and vegetables can cause extra gas to form and this can also increase the bloating.  Drink plenty of fluids but you should avoid alcoholic beverages for 24 hours.  ACTIVITY: Your care partner should take you home directly after the procedure.  You should plan to take it easy, moving slowly for the rest of the day.  You can resume normal activity the day after the procedure however you should NOT DRIVE or use heavy machinery for 24 hours (because of the sedation medicines used during the test).    SYMPTOMS TO REPORT IMMEDIATELY: A gastroenterologist can be reached at any hour.  During normal business hours, 8:30 AM to 5:00 PM Monday through Friday,  call [phone].  After hours and on weekends, please call the GI answering service at [phone] who will take a message and have the physician on call contact you.   Following lower endoscopy (colonoscopy or flexible sigmoidoscopy):  Excessive amounts of blood in the stool  Significant tenderness or worsening of abdominal pains  Swelling of the abdomen that is new, acute  Fever of 100F or higher  FOLLOW UP: Our staff will call the home number listed on your records the next business day following your procedure to check on you and address any questions or concerns that you may have at that time regarding the information given to you following your procedure. This is a courtesy call and so if there is no answer at the home number and we have not heard from you through the emergency physician on call, we will assume that you have returned to your regular daily activities without incident.  SIGNATURES/CONFIDENTIALITY: You and/or your care partner have signed paperwork which will be entered into your electronic medical record.  These signatures attest to the fact that that the information above on your After Visit Summary has been reviewed and is understood.  Full responsibility of the confidentiality of this discharge information lies with you and/or your care-partner.  Please read over information about hemorrhoids, diverticulosis and high fiber diets  Continue your normal medications  [facility] will be in touch with you  about the constipation trial

## 2013-02-11 NOTE — Op Note (Signed)
[facility] [address] [place], [address]   COLONOSCOPY PROCEDURE REPORT  PATIENT: Holly [name], Holly [name]  MR#: [id number] BIRTHDATE: [birth year] , 56  yrs. old GENDER: Female ENDOSCOPIST: Louis Meckel, MD REFERRED XB:JYNWG Tysinger, PA-C PROCEDURE DATE:  02/11/2013 PROCEDURE:   Colonoscopy, diagnostic ASA CLASS:   Class II INDICATIONS:Constipation and Rectal Bleeding. MEDICATIONS: MAC sedation, administered by CRNA and propofol (Diprivan) 200mg  IV  DESCRIPTION OF PROCEDURE:   After the risks benefits and alternatives of the procedure were thoroughly explained, informed consent was obtained.  A digital rectal exam revealed no abnormalities of the rectum.   The LB [id number]  endoscope was introduced through the anus and advanced to the cecum, which was identified by both the appendix and ileocecal valve. No adverse events experienced.   The quality of the prep was Suprep good  The instrument was then slowly withdrawn as the colon was fully examined.      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   Mild diverticulosis was noted in the sigmoid colon, descending colon, transverse colon, and ascending colon.   Internal hemorrhoids were found. Retroflexed views revealed no abnormalities. The time to cecum=4 minutes 28 seconds.  Withdrawal time=7 minutes 07 seconds.  The scope was withdrawn and the procedure completed. COMPLICATIONS: There were no complications.  ENDOSCOPIC IMPRESSION:   1.   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   Mild diverticulosis was noted in the sigmoid colon, descending colon, transverse colon, and ascending colon 3.   Internal hemorrhoids  Limited rectal bleeding secondary to hemorrhoids  RECOMMENDATIONS: patient will consider enrollment in a constipation trial  eSigned:  Louis Meckel, MD 02/11/2013 2:12 PM   cc:

## 2013-02-12 ENCOUNTER — Telehealth: Payer: Self-pay | Admitting: *Deleted

## 2013-02-12 NOTE — Telephone Encounter (Signed)
No answer, left message to call if questions or concerns. 

## 2013-02-25 ENCOUNTER — Ambulatory Visit (INDEPENDENT_AMBULATORY_CARE_PROVIDER_SITE_OTHER): Payer: BC Managed Care – PPO

## 2013-02-25 DIAGNOSIS — K5900 Constipation, unspecified: Secondary | ICD-10-CM

## 2013-03-28 ENCOUNTER — Telehealth: Payer: Self-pay | Admitting: Medical

## 2013-03-28 NOTE — Telephone Encounter (Signed)
LM

## 2013-04-19 DIAGNOSIS — R3129 Other microscopic hematuria: Secondary | ICD-10-CM

## 2013-04-19 HISTORY — DX: Other microscopic hematuria: R31.29

## 2013-05-10 ENCOUNTER — Other Ambulatory Visit: Payer: Self-pay | Admitting: Medical

## 2013-05-14 ENCOUNTER — Other Ambulatory Visit: Payer: Self-pay | Admitting: Medical

## 2013-06-29 ENCOUNTER — Encounter: Payer: Self-pay | Admitting: Medical

## 2013-06-29 ENCOUNTER — Ambulatory Visit (INDEPENDENT_AMBULATORY_CARE_PROVIDER_SITE_OTHER): Payer: BC Managed Care – PPO | Admitting: Medical

## 2013-06-29 VITALS — BP 100/80 | HR 76 | Temp 98.0°F | Resp 16 | Ht 64.0 in | Wt 156.0 lb

## 2013-06-29 DIAGNOSIS — F17200 Nicotine dependence, unspecified, uncomplicated: Secondary | ICD-10-CM

## 2013-06-29 DIAGNOSIS — Z23 Encounter for immunization: Secondary | ICD-10-CM

## 2013-06-29 DIAGNOSIS — E559 Vitamin D deficiency, unspecified: Secondary | ICD-10-CM

## 2013-06-29 DIAGNOSIS — Z0000 Encounter for general adult medical examination without abnormal findings: Secondary | ICD-10-CM

## 2013-06-29 DIAGNOSIS — R3129 Other microscopic hematuria: Secondary | ICD-10-CM

## 2013-06-29 DIAGNOSIS — E785 Hyperlipidemia, unspecified: Secondary | ICD-10-CM

## 2013-06-29 LAB — POCT URINALYSIS DIPSTICK
Bilirubin, UA: NEGATIVE
Glucose, UA: NEGATIVE
Ketones, UA: NEGATIVE
Leukocytes, UA: NEGATIVE
Nitrite, UA: NEGATIVE
pH, UA: 5

## 2013-06-29 NOTE — Progress Notes (Signed)
Subjective:   HPI  Holly [name] is a 57 y.o. female who presents for a complete physical.  Doing well, no particular c/o.   sometimes forgets to take her medications.   She is still smoking.  Her husband smokes as well.    Preventative care: Last ophthalmology visit:yes- next appt. 07/03/13 Last dental visit:yes Dr. Derrill Kay Last colonoscopy:01/2013 Last mammogram:01/2013 Last gynecological exam:06/29/13 Last EKG:06/23/12 Last labs:2013  Prior vaccinations: TD or Tdap:08/2003 Influenza: NO FLU VACCINE Pneumococcal:06/2012  Advanced directive:n/a Health care power of attorney:n/a Living will:n/a  Reviewed their medical, surgical, family, social, medication, and allergy history and updated chart as appropriate.  Past Medical History  Diagnosis Date  . Hyperlipidemia   . Wears glasses   . Tobacco use disorder   . Herpetic lesion     hx/o herpetic lesion right hand and left ear? gets once to twice yearly  . Menopausal disorder   . Vitamin D deficiency   . History of mammogram 01/2013    normal  . Abdominal pain   . Night sweats   . Sleep difficulties     due to menopause  . Chronic constipation     enrolled in a constipation study with [facility] 01/2013, on Plecanatide 6mg  daily  . Microscopic hematuria 04/2013    urology eval, no worrisome causes.  recheck only with frank hematuria.  . S/P hysterectomy     due to fibroids  . Diverticulosis 01/2013    on colonoscopy    Past Surgical History  Procedure Laterality Date  . Partial hysterectomy  1989    still has 1 ovary; hx/o uterine fibroids  . Ectopic pregnancy surgery  1990  . Colonoscopy  01/2013    external hemorrohids, severe melanosis, diverticulosis; Dr. Arlyce Dice.  prior colonoscopy Dr. Loreta Ave.    History   Social History  . Marital Status: Married    Spouse Name: N/A    Number of Children: N/A  . Years of Education: N/A   Occupational History  . bus driver [employer]   Social History Main  Topics  . Smoking status: Current Every Day Smoker -- 0.30 packs/day for 15 years    Types: Cigarettes  . Smokeless tobacco: Never Used  . Alcohol Use: No  . Drug Use: No  . Sexual Activity: Not on file     Comment: married, 2 chlidren, school bus driver, exercise with walking   Other Topics Concern  . Not on file   Social History Narrative   Bus driver, works at [employer], exercises with walking, married, has 2 children, ages 46yo and 73yo, both live in [place], 3 grandchildren    Family History  Problem Relation Age of Onset  . Hyperlipidemia Mother   . Hypertension Mother   . Diabetes Mother   . Hyperlipidemia Father   . Hypertension Father   . Diabetes Father   . Stroke Brother     during surgery  . Cancer Neg Hx   . Heart disease Neg Hx     Current outpatient prescriptions:calcium-vitamin D (OSCAL WITH D) 500-200 MG-UNIT per tablet, Take 1 tablet by mouth 2 (two) times daily.  , Disp: , Rfl: ;  cloNIDine (CATAPRES) 0.1 MG tablet, take 1 tablet by mouth at bedtime, Disp: 90 tablet, Rfl: 0;  pravastatin (PRAVACHOL) 40 MG tablet, Take 1 tablet (40 mg total) by mouth daily., Disp: 90 tablet, Rfl: 0 valACYclovir (VALTREX) 500 MG tablet, take 1 tablet by mouth twice a day, Disp: 60 tablet, Rfl:  2  Allergies  Allergen Reactions  . Penicillins Rash     Review of Systems Constitutional: -fever, -chills, -sweats, -unexpected weight change, -decreased appetite, -fatigue Allergy: -sneezing, -itching, -congestion Dermatology: -changing moles, --rash, -lumps ENT: -runny nose, -ear pain, -sore throat, -hoarseness, -sinus pain, -teeth pain, - ringing in ears, -hearing loss, -nosebleeds Cardiology: -chest pain, -palpitations, -swelling, -difficulty breathing when lying flat, -waking up short of breath Respiratory: -cough, -shortness of breath, -difficulty breathing with exercise or exertion, -wheezing, -coughing up blood Gastroenterology: -abdominal pain, -nausea, -vomiting,  -diarrhea, -constipation, -blood in stool, -changes in bowel movement, -difficulty swallowing or eating Hematology: -bleeding, -bruising  Musculoskeletal: -joint aches, -muscle aches, -joint swelling, -back pain, -neck pain, -cramping, -changes in gait Ophthalmology: denies vision changes, eye redness, itching, discharge Urology: -burning with urination, -difficulty urinating, -blood in urine, -urinary frequency, -urgency, -incontinence Neurology: -headache, -weakness, -tingling, -numbness, -memory loss, -falls, -dizziness Psychology: -depressed mood, -agitation, -sleep problems     Objective:   Physical Exam  BP 100/80  Pulse 76  Temp(Src) 98 F (36.7 C) (Oral)  Resp 16  Ht 5\' 4"  (1.626 m)  Wt 156 lb (70.761 kg)  BMI 26.76 kg/m2  General appearance: alert, no distress, WD/WN, pleasant AA female Skin: no worrisome lesions, few scattered benign appearing moles, right ear pinna just outside canal posteriorly with few small round scars from prior herpetic lesions HEENT: normocephalic, conjunctiva/corneas normal, sclerae anicteric, PERRLA, EOMi, nares patent, no discharge or erythema, pharynx normal Oral cavity: MMM, tongue normal, teeth in good repair Neck: supple, no lymphadenopathy, no thyromegaly, no masses, normal ROM,no bruits Chest: non tender, normal shape and expansion Heart: RRR, normal S1, S2, no murmurs Lungs: CTA bilaterally, no wheezes, rhonchi, or rales Abdomen: +bs, soft, non tender, non distended, no masses, no hepatomegaly, no splenomegaly, no bruits Back: non tender, normal ROM, no scoliosis Musculoskeletal: upper extremities non tender, no obvious deformity, normal ROM throughout, lower extremities non tender, no obvious deformity, normal ROM throughout Extremities: no edema, no cyanosis, no clubbing Pulses: 2+ symmetric, upper and lower extremities, normal cap refill Neurological: alert, oriented x 3, CN2-12 intact, strength normal upper extremities and lower  extremities, sensation normal throughout, DTRs 2+ throughout, no cerebellar signs, gait normal Psychiatric: normal affect, behavior normal, pleasant  Breast: nontender, no masses or lumps, no skin changes, no nipple discharge or inversion, no axillary lymphadenopathy Gyn: Normal external genitalia without lesions, vagina with normal mucosa, s/p hysterectomy, no abnormal vaginal discharge.  Uterus and adnexa not enlarged, nontender, no masses.  Exam chaperoned by nurse. Rectal: deferred    Assessment and Plan :    Encounter Diagnoses  Name Primary?  . Routine general medical examination at a health care facility Yes  . Unspecified vitamin D deficiency   . Hyperlipidemia   . Tobacco use disorder   . Microscopic hematuria   . Need for Tdap vaccination     Physical exam - discussed healthy lifestyle, diet, exercise, preventative care, vaccinations, and addressed their concerns.  Handout given.  She updated colonoscopy in 2014, saw urology for hematuria.   recommend she increase exercise.    Counseled on the Tdap (tetanus, diptheria, and acellular pertussis) vaccine.  Vaccine information sheet given. Tdap vaccine given after consent obtained. Declines flu vaccine. Hyperlipidemia - c/t same medication Tobacco use - discussed risks of tobacco, strongly recommended she stop tobacco. Microscopic hematuria - reviewed urology notes.   No further workup unless frank hematuria. Follow-up pending labs

## 2013-06-29 NOTE — Patient Instructions (Signed)
YOU CAN QUIT SMOKING!  Talk to your medical provider about using medicines to help you quit. These include nicotine replacement gum, lozenges, or skin patches.  Consider calling 1-800-QUIT-NOW, a toll free 24/7 hotline with free counseling to help you quit.  If you are ready to quit smoking or are thinking about it, congratulations! You have chosen to help yourself be healthier and live longer! There are lots of different ways to quit smoking. Nicotine gum, nicotine patches, a nicotine inhaler, or nicotine nasal spray can help with physical craving. Hypnosis, support groups, and medicines help break the habit of smoking. TIPS TO GET OFF AND STAY OFF CIGARETTES  Learn to predict your moods. Do not let a bad situation be your excuse to have a cigarette. Some situations in your life might tempt you to have a cigarette.   Ask friends and co-workers not to smoke around you.   Make your home smoke-free.   Never have "just one" cigarette. It leads to wanting another and another. Remind yourself of your decision to quit.   On a card, make a list of your reasons for not smoking. Read it at least the same number of times a day as you have a cigarette. Tell yourself everyday, "I do not want to smoke. I choose not to smoke."   Ask someone at home or work to help you with your plan to quit smoking.   Have something planned after you eat or have a cup of coffee. Take a walk or get other exercise to perk you up. This will help to keep you from overeating.   Try a relaxation exercise to calm you down and decrease your stress. Remember, you may be tense and nervous the first two weeks after you quit. This will pass.   Find new activities to keep your hands busy. Play with a pen, coin, or rubber band. Doodle or draw things on paper.   Brush your teeth right after eating. This will help cut down the craving for the taste of tobacco after meals. You can try mouthwash too.   Try gum, breath mints, or diet  candy to keep something in your mouth.  IF YOU SMOKE AND WANT TO QUIT:  Do not stock up on cigarettes. Never buy a carton. Wait until one pack is finished before you buy another.   Never carry cigarettes with you at work or at home.   Keep cigarettes as far away from you as possible. Leave them with someone else.   Never carry matches or a lighter with you.   Ask yourself, "Do I need this cigarette or is this just a reflex?"   Bet with someone that you can quit. Put cigarette money in a piggy bank every morning. If you smoke, you give up the money. If you do not smoke, by the end of the week, you keep the money.   Keep trying. It takes 21 days to change a habit!  Document Released: 06/01/2009 Document Revised: 04/17/2011 Document Reviewed: 06/01/2009 [employer] Patient Information 2012 [employer].   

## 2013-06-30 ENCOUNTER — Other Ambulatory Visit: Payer: Self-pay | Admitting: Medical

## 2013-06-30 DIAGNOSIS — R9389 Abnormal findings on diagnostic imaging of other specified body structures: Secondary | ICD-10-CM

## 2013-06-30 DIAGNOSIS — R748 Abnormal levels of other serum enzymes: Secondary | ICD-10-CM

## 2013-06-30 LAB — CBC
HCT: 41.9 % (ref 36.0–46.0)
Hemoglobin: 13.9 g/dL (ref 12.0–15.0)
MCV: 86.4 fL (ref 78.0–100.0)
RBC: 4.85 MIL/uL (ref 3.87–5.11)
WBC: 7.9 10*3/uL (ref 4.0–10.5)

## 2013-06-30 LAB — VITAMIN D 25 HYDROXY (VIT D DEFICIENCY, FRACTURES): Vit D, 25-Hydroxy: 29 ng/mL — ABNORMAL LOW (ref 30–89)

## 2013-06-30 LAB — COMPREHENSIVE METABOLIC PANEL
ALT: <8 U/L (ref 0–35)
AST: 13 U/L (ref 0–37)
Albumin: 4.2 g/dL (ref 3.5–5.2)
CO2: 28 mEq/L (ref 19–32)
Calcium: 9.6 mg/dL (ref 8.4–10.5)
Chloride: 105 mEq/L (ref 96–112)
Creat: 0.63 mg/dL (ref 0.50–1.10)
Potassium: 4.8 mEq/L (ref 3.5–5.3)

## 2013-06-30 LAB — LIPID PANEL: LDL Cholesterol: 86 mg/dL (ref 0–99)

## 2013-06-30 MED ORDER — PRAVASTATIN SODIUM 40 MG PO TABS: 40.0000 mg | ORAL_TABLET | Freq: Every day | ORAL | Status: DC

## 2013-06-30 MED ORDER — CALCIUM CARBONATE-VITAMIN D 500-400 MG-UNIT PO TABS: 1.0000 | ORAL_TABLET | Freq: Two times a day (BID) | ORAL | Status: DC

## 2013-06-30 MED ORDER — CLONIDINE HCL 0.1 MG PO TABS: 0.1000 mg | ORAL_TABLET | Freq: Every day | ORAL | Status: DC

## 2013-06-30 NOTE — Progress Notes (Signed)
Patient is aware that she needs to go by [facility] and get a hip xray. CLS

## 2013-07-07 ENCOUNTER — Encounter: Payer: Self-pay | Admitting: Medical

## 2013-08-21 ENCOUNTER — Encounter (HOSPITAL_COMMUNITY): Payer: Self-pay | Admitting: Emergency Medicine

## 2013-08-21 ENCOUNTER — Emergency Department (HOSPITAL_COMMUNITY)
Admission: EM | Admit: 2013-08-21 | Discharge: 2013-08-21 | Disposition: A | Discharge status: Home / Self Care | Payer: BC Managed Care – PPO | Attending: Emergency Medicine | Admitting: Emergency Medicine

## 2013-08-21 DIAGNOSIS — Y92410 Unspecified street and highway as the place of occurrence of the external cause: Secondary | ICD-10-CM | POA: Insufficient documentation

## 2013-08-21 DIAGNOSIS — Z8719 Personal history of other diseases of the digestive system: Secondary | ICD-10-CM | POA: Insufficient documentation

## 2013-08-21 DIAGNOSIS — Z880 Allergy status to penicillin: Secondary | ICD-10-CM | POA: Insufficient documentation

## 2013-08-21 DIAGNOSIS — T148XXA Other injury of unspecified body region, initial encounter: Secondary | ICD-10-CM

## 2013-08-21 DIAGNOSIS — Z87448 Personal history of other diseases of urinary system: Secondary | ICD-10-CM | POA: Insufficient documentation

## 2013-08-21 DIAGNOSIS — Z90710 Acquired absence of both cervix and uterus: Secondary | ICD-10-CM | POA: Insufficient documentation

## 2013-08-21 DIAGNOSIS — S4980XA Other specified injuries of shoulder and upper arm, unspecified arm, initial encounter: Secondary | ICD-10-CM | POA: Insufficient documentation

## 2013-08-21 DIAGNOSIS — F17200 Nicotine dependence, unspecified, uncomplicated: Secondary | ICD-10-CM | POA: Insufficient documentation

## 2013-08-21 DIAGNOSIS — S39012A Strain of muscle, fascia and tendon of lower back, initial encounter: Secondary | ICD-10-CM

## 2013-08-21 DIAGNOSIS — IMO0002 Reserved for concepts with insufficient information to code with codable children: Secondary | ICD-10-CM | POA: Insufficient documentation

## 2013-08-21 DIAGNOSIS — E785 Hyperlipidemia, unspecified: Secondary | ICD-10-CM | POA: Insufficient documentation

## 2013-08-21 DIAGNOSIS — S46909A Unspecified injury of unspecified muscle, fascia and tendon at shoulder and upper arm level, unspecified arm, initial encounter: Secondary | ICD-10-CM | POA: Insufficient documentation

## 2013-08-21 DIAGNOSIS — Z79899 Other long term (current) drug therapy: Secondary | ICD-10-CM | POA: Insufficient documentation

## 2013-08-21 DIAGNOSIS — Z8619 Personal history of other infectious and parasitic diseases: Secondary | ICD-10-CM | POA: Insufficient documentation

## 2013-08-21 DIAGNOSIS — Z8742 Personal history of other diseases of the female genital tract: Secondary | ICD-10-CM | POA: Insufficient documentation

## 2013-08-21 DIAGNOSIS — Z791 Long term (current) use of non-steroidal anti-inflammatories (NSAID): Secondary | ICD-10-CM | POA: Insufficient documentation

## 2013-08-21 DIAGNOSIS — Y9389 Activity, other specified: Secondary | ICD-10-CM | POA: Insufficient documentation

## 2013-08-21 MED ORDER — IBUPROFEN 800 MG PO TABS
800.0000 mg | ORAL_TABLET | Freq: Once | ORAL | Status: AC
Start: 2013-08-21 — End: 2013-08-21
  Administered 2013-08-21: 800 mg via ORAL
  Filled 2013-08-21: qty 1

## 2013-08-21 MED ORDER — HYDROCODONE-ACETAMINOPHEN 5-325 MG PO TABS: 2.0000 | ORAL_TABLET | ORAL | Status: DC | PRN

## 2013-08-21 MED ORDER — NAPROXEN 500 MG PO TABS: 500.0000 mg | ORAL_TABLET | Freq: Two times a day (BID) | ORAL | Status: DC

## 2013-08-21 MED ORDER — METHOCARBAMOL 500 MG PO TABS
1000.0000 mg | ORAL_TABLET | Freq: Once | ORAL | Status: AC
  Administered 2013-08-21: 1000 mg via ORAL
  Filled 2013-08-21: qty 2

## 2013-08-21 MED ORDER — METHOCARBAMOL 500 MG PO TABS: 500.0000 mg | ORAL_TABLET | Freq: Three times a day (TID) | ORAL | Status: DC | PRN

## 2013-08-21 NOTE — ED Notes (Signed)
MD at bedside. 

## 2013-08-21 NOTE — Discharge Instructions (Signed)
Muscle Strain Muscle strain occurs when a muscle is stretched beyond its normal length. A small number of muscle fibers generally are torn. This is especially common in athletes. This happens when a sudden, violent force placed on a muscle stretches it too far. Usually, recovery from muscle strain takes 1 to 2 weeks. Complete healing will take 5 to 6 weeks.  HOME CARE INSTRUCTIONS   While awake, apply ice to the sore muscle for the first 2 days after the injury.  Put ice in a plastic bag.  Place a towel between your skin and the bag.  Leave the ice on for 15-20 minutes each hour.  Do not use the strained muscle for several days, until you no longer have pain.  You may wrap the injured area with an elastic bandage for comfort. Be careful not to wrap it too tightly. This may interfere with blood circulation or increase swelling.  Only take over-the-counter or prescription medicines for pain, discomfort, or fever as directed by your caregiver. SEEK MEDICAL CARE IF:  You have increasing pain or swelling in the injured area. MAKE SURE YOU:   Understand these instructions.  Will watch your condition.  Will get help right away if you are not doing well or get worse. Document Released: 08/05/2005 Document Revised: 10/28/2011 Document Reviewed: 08/17/2011 [facility] Patient Information 2014 [place], [place].  Lumbosacral Strain Lumbosacral strain is one of the most common causes of back pain. There are many causes of back pain. Most are not serious conditions. CAUSES  Your backbone (spinal column) is made up of 24 main vertebral bodies, the sacrum, and the coccyx. These are held together by muscles and tough, fibrous tissue (ligaments). Nerve roots pass through the openings between the vertebrae. A sudden move or injury to the back may cause injury to, or pressure on, these nerves. This may result in localized back pain or pain movement (radiation) into the buttocks, down the leg, and into the  foot. Sharp, shooting pain from the buttock down the back of the leg (sciatica) is frequently associated with a ruptured (herniated) disk. Pain may be caused by muscle spasm alone. Your caregiver can often find the cause of your pain by the details of your symptoms and an exam. In some cases, you may need tests (such as X-rays). Your caregiver will work with you to decide if any tests are needed based on your specific exam. HOME CARE INSTRUCTIONS   Avoid an underactive lifestyle. Active exercise, as directed by your caregiver, is your greatest weapon against back pain.  Avoid hard physical activities (tennis, racquetball, waterskiing) if you are not in proper physical condition for it. This may aggravate or create problems.  If you have a back problem, avoid sports requiring sudden body movements. Swimming and walking are generally safer activities.  Maintain good posture.  Avoid becoming overweight (obese).  Use bed rest for only the most extreme, sudden (acute) episode. Your caregiver will help you determine how much bed rest is necessary.  For acute conditions, you may put ice on the injured area.  Put ice in a plastic bag.  Place a towel between your skin and the bag.  Leave the ice on for 15-20 minutes at a time, every 2 hours, or as needed.  After you are improved and more active, it may help to apply heat for 30 minutes before activities. See your caregiver if you are having pain that lasts longer than expected. Your caregiver can advise appropriate exercises or therapy if  needed. With conditioning, most back problems can be avoided. SEEK IMMEDIATE MEDICAL CARE IF:   You have numbness, tingling, weakness, or problems with the use of your arms or legs.  You experience severe back pain not relieved with medicines.  There is a change in bowel or bladder control.  You have increasing pain in any area of the body, including your belly (abdomen).  You notice shortness of breath,  dizziness, or feel faint.  You feel sick to your stomach (nauseous), are throwing up (vomiting), or become sweaty.  You notice discoloration of your toes or legs, or your feet get very cold.  Your back pain is getting worse.  You have a fever. MAKE SURE YOU:   Understand these instructions.  Will watch your condition.  Will get help right away if you are not doing well or get worse. Document Released: 05/15/2005 Document Revised: 10/28/2011 Document Reviewed: 11/04/2008 [facility] Patient Information 2014 [place], [place].
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
## 2013-08-23 ENCOUNTER — Ambulatory Visit (INDEPENDENT_AMBULATORY_CARE_PROVIDER_SITE_OTHER): Payer: BC Managed Care – PPO | Admitting: Medical

## 2013-08-23 ENCOUNTER — Encounter: Payer: Self-pay | Admitting: Medical

## 2013-08-23 VITALS — BP 150/90 | HR 82 | Temp 97.8°F | Resp 18 | Wt 160.0 lb

## 2013-08-23 DIAGNOSIS — R209 Unspecified disturbances of skin sensation: Secondary | ICD-10-CM

## 2013-08-23 DIAGNOSIS — M25512 Pain in left shoulder: Secondary | ICD-10-CM

## 2013-08-23 DIAGNOSIS — S40022S Contusion of left upper arm, sequela: Secondary | ICD-10-CM

## 2013-08-23 DIAGNOSIS — R202 Paresthesia of skin: Secondary | ICD-10-CM

## 2013-08-23 DIAGNOSIS — S39012D Strain of muscle, fascia and tendon of lower back, subsequent encounter: Secondary | ICD-10-CM

## 2013-08-23 DIAGNOSIS — Z5189 Encounter for other specified aftercare: Secondary | ICD-10-CM

## 2013-08-23 DIAGNOSIS — M25519 Pain in unspecified shoulder: Secondary | ICD-10-CM

## 2013-08-23 DIAGNOSIS — IMO0002 Reserved for concepts with insufficient information to code with codable children: Secondary | ICD-10-CM

## 2013-08-23 DIAGNOSIS — M549 Dorsalgia, unspecified: Secondary | ICD-10-CM

## 2013-08-23 DIAGNOSIS — S134XXS Sprain of ligaments of cervical spine, sequela: Secondary | ICD-10-CM
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
## 2013-08-24 ENCOUNTER — Other Ambulatory Visit: Payer: Self-pay | Admitting: Family Medicine

## 2013-08-24 DIAGNOSIS — M25512 Pain in left shoulder: Secondary | ICD-10-CM

## 2013-08-24 DIAGNOSIS — M549 Dorsalgia, unspecified: Secondary | ICD-10-CM

## 2013-08-25 ENCOUNTER — Ambulatory Visit: Payer: BC Managed Care – PPO | Admitting: Physical Therapy

## 2013-08-26 ENCOUNTER — Ambulatory Visit
Admission: RE | Admit: 2013-08-26 | Discharge: 2013-08-26 | Disposition: A | Discharge status: Home / Self Care | Payer: BC Managed Care – PPO | Source: Ambulatory Visit | Attending: Medical | Admitting: Medical

## 2013-08-26 DIAGNOSIS — R748 Abnormal levels of other serum enzymes: Secondary | ICD-10-CM

## 2013-08-26 DIAGNOSIS — R9389 Abnormal findings on diagnostic imaging of other specified body structures: Secondary | ICD-10-CM

## 2013-08-27 ENCOUNTER — Encounter: Payer: Self-pay | Admitting: Medical

## 2013-08-27 ENCOUNTER — Ambulatory Visit (INDEPENDENT_AMBULATORY_CARE_PROVIDER_SITE_OTHER): Payer: BC Managed Care – PPO | Admitting: Medical

## 2013-08-27 DIAGNOSIS — IMO0002 Reserved for concepts with insufficient information to code with codable children: Secondary | ICD-10-CM

## 2013-08-27 DIAGNOSIS — M549 Dorsalgia, unspecified: Secondary | ICD-10-CM

## 2013-08-27 DIAGNOSIS — R209 Unspecified disturbances of skin sensation: Secondary | ICD-10-CM

## 2013-08-27 DIAGNOSIS — M25512 Pain in left shoulder: Secondary | ICD-10-CM

## 2013-08-27 DIAGNOSIS — R202 Paresthesia of skin: Secondary | ICD-10-CM

## 2013-08-27 DIAGNOSIS — M25519 Pain in unspecified shoulder: Secondary | ICD-10-CM

## 2013-08-27 DIAGNOSIS — F430 Acute stress reaction: Secondary | ICD-10-CM

## 2013-08-27 DIAGNOSIS — Z5189 Encounter for other specified aftercare: Secondary | ICD-10-CM
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
## 2013-09-01 ENCOUNTER — Other Ambulatory Visit: Payer: Self-pay | Admitting: Family Medicine

## 2013-09-01 ENCOUNTER — Ambulatory Visit (INDEPENDENT_AMBULATORY_CARE_PROVIDER_SITE_OTHER): Payer: BC Managed Care – PPO | Admitting: Medical

## 2013-09-01 ENCOUNTER — Encounter: Payer: Self-pay | Admitting: Medical

## 2013-09-01 DIAGNOSIS — M549 Dorsalgia, unspecified: Secondary | ICD-10-CM

## 2013-09-01 DIAGNOSIS — R9389 Abnormal findings on diagnostic imaging of other specified body structures: Secondary | ICD-10-CM

## 2013-09-01 DIAGNOSIS — Z5189 Encounter for other specified aftercare: Secondary | ICD-10-CM

## 2013-09-01 DIAGNOSIS — IMO0002 Reserved for concepts with insufficient information to code with codable children: Secondary | ICD-10-CM

## 2013-09-01 MED ORDER — NAPROXEN 500 MG PO TABS: 500.0000 mg | ORAL_TABLET | Freq: Two times a day (BID) | ORAL | Status: DC

## 2013-09-01 MED ORDER — METHOCARBAMOL 500 MG PO TABS: 500.0000 mg | ORAL_TABLET | Freq: Three times a day (TID) | ORAL | Status: DC | PRN
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
## 2013-09-03 ENCOUNTER — Ambulatory Visit: Payer: BC Managed Care – PPO | Admitting: Medical

## 2013-09-09 ENCOUNTER — Encounter (HOSPITAL_COMMUNITY): Payer: BC Managed Care – PPO

## 2013-09-09 ENCOUNTER — Ambulatory Visit (HOSPITAL_COMMUNITY): Payer: BC Managed Care – PPO

## 2013-09-14 ENCOUNTER — Ambulatory Visit (HOSPITAL_COMMUNITY): Payer: BC Managed Care – PPO

## 2013-09-14 ENCOUNTER — Encounter (HOSPITAL_COMMUNITY): Payer: BC Managed Care – PPO

## 2013-10-08 ENCOUNTER — Encounter (HOSPITAL_COMMUNITY)
Admission: RE | Admit: 2013-10-08 | Discharge: 2013-10-08 | Disposition: A | Discharge status: Home / Self Care | Payer: BC Managed Care – PPO | Source: Ambulatory Visit | Attending: Medical | Admitting: Medical

## 2013-10-08 ENCOUNTER — Ambulatory Visit (HOSPITAL_COMMUNITY)
Admission: RE | Admit: 2013-10-08 | Discharge: 2013-10-08 | Disposition: A | Discharge status: Home / Self Care | Payer: BC Managed Care – PPO | Source: Ambulatory Visit | Attending: Medical | Admitting: Medical

## 2013-10-08 DIAGNOSIS — R748 Abnormal levels of other serum enzymes: Secondary | ICD-10-CM | POA: Insufficient documentation

## 2013-10-08 DIAGNOSIS — R9389 Abnormal findings on diagnostic imaging of other specified body structures: Secondary | ICD-10-CM

## 2013-10-08 DIAGNOSIS — M949 Disorder of cartilage, unspecified: Secondary | ICD-10-CM

## 2013-10-08 DIAGNOSIS — M899 Disorder of bone, unspecified: Secondary | ICD-10-CM | POA: Insufficient documentation

## 2013-10-08 MED ORDER — TECHNETIUM TC 99M MEDRONATE IV KIT
25.0000 | PACK | Freq: Once | INTRAVENOUS | Status: AC | PRN
  Administered 2013-10-08: 25 via INTRAVENOUS

## 2013-10-11 NOTE — Progress Notes (Signed)
LMOM TO CB. CLS 

## 2013-10-12 NOTE — Progress Notes (Signed)
Pt called, advised of results.

## 2013-10-15 ENCOUNTER — Encounter (HOSPITAL_COMMUNITY): Payer: BC Managed Care – PPO

## 2013-10-20 ENCOUNTER — Encounter: Payer: Self-pay | Admitting: Medical

## 2013-10-20 ENCOUNTER — Ambulatory Visit (INDEPENDENT_AMBULATORY_CARE_PROVIDER_SITE_OTHER): Payer: BC Managed Care – PPO | Admitting: Medical

## 2013-10-20 DIAGNOSIS — R202 Paresthesia of skin: Secondary | ICD-10-CM

## 2013-10-20 DIAGNOSIS — R209 Unspecified disturbances of skin sensation: Secondary | ICD-10-CM

## 2013-10-20 DIAGNOSIS — IMO0002 Reserved for concepts with insufficient information to code with codable children: Secondary | ICD-10-CM

## 2013-10-20 DIAGNOSIS — M549 Dorsalgia, unspecified: Secondary | ICD-10-CM

## 2013-10-20 DIAGNOSIS — M542 Cervicalgia: Secondary | ICD-10-CM

## 2013-10-20 DIAGNOSIS — M792 Neuralgia and neuritis, unspecified: Secondary | ICD-10-CM
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
## 2013-11-17 ENCOUNTER — Encounter: Payer: Self-pay | Admitting: Medical

## 2013-11-17 ENCOUNTER — Other Ambulatory Visit: Payer: Self-pay | Admitting: Medical

## 2013-11-17 ENCOUNTER — Ambulatory Visit (INDEPENDENT_AMBULATORY_CARE_PROVIDER_SITE_OTHER): Payer: BC Managed Care – PPO | Admitting: Medical

## 2013-11-17 DIAGNOSIS — M549 Dorsalgia, unspecified: Secondary | ICD-10-CM

## 2013-11-17 DIAGNOSIS — R936 Abnormal findings on diagnostic imaging of limbs: Secondary | ICD-10-CM

## 2013-11-17 DIAGNOSIS — R748 Abnormal levels of other serum enzymes: Secondary | ICD-10-CM

## 2013-11-17 DIAGNOSIS — E559 Vitamin D deficiency, unspecified: Secondary | ICD-10-CM

## 2013-11-17 DIAGNOSIS — M542 Cervicalgia: Secondary | ICD-10-CM

## 2013-11-17 DIAGNOSIS — R209 Unspecified disturbances of skin sensation: Secondary | ICD-10-CM

## 2013-11-17 DIAGNOSIS — F17200 Nicotine dependence, unspecified, uncomplicated: Secondary | ICD-10-CM

## 2013-11-17 DIAGNOSIS — IMO0002 Reserved for concepts with insufficient information to code with codable children: Secondary | ICD-10-CM

## 2013-11-17 DIAGNOSIS — Z807 Family history of other malignant neoplasms of lymphoid, hematopoietic and related tissues: Secondary | ICD-10-CM

## 2013-11-17 DIAGNOSIS — R0602 Shortness of breath: Secondary | ICD-10-CM
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
## 2013-11-17 NOTE — Patient Instructions (Signed)
Massage Therapy:  Janet Blevins [facility] [address] [place], [place] [address] [phone] [email or web address]   Tiffany Wright [facility] [address] [place], [place] [address] [phone]   

## 2013-12-02 ENCOUNTER — Ambulatory Visit (INDEPENDENT_AMBULATORY_CARE_PROVIDER_SITE_OTHER): Payer: BC Managed Care – PPO | Admitting: Medical

## 2013-12-02 ENCOUNTER — Encounter: Payer: Self-pay | Admitting: Medical

## 2013-12-02 ENCOUNTER — Ambulatory Visit
Admission: RE | Admit: 2013-12-02 | Discharge: 2013-12-02 | Disposition: A | Discharge status: Home / Self Care | Payer: BC Managed Care – PPO | Source: Ambulatory Visit | Attending: Medical | Admitting: Medical

## 2013-12-02 VITALS — BP 120/78 | HR 72 | Temp 98.3°F | Resp 16 | Wt 159.0 lb

## 2013-12-02 DIAGNOSIS — R0602 Shortness of breath: Secondary | ICD-10-CM

## 2013-12-02 DIAGNOSIS — F17200 Nicotine dependence, unspecified, uncomplicated: Secondary | ICD-10-CM

## 2013-12-02 DIAGNOSIS — Z807 Family history of other malignant neoplasms of lymphoid, hematopoietic and related tissues: Secondary | ICD-10-CM

## 2013-12-02 DIAGNOSIS — R0989 Other specified symptoms and signs involving the circulatory and respiratory systems: Secondary | ICD-10-CM

## 2013-12-02 DIAGNOSIS — R9389 Abnormal findings on diagnostic imaging of other specified body structures: Secondary | ICD-10-CM

## 2013-12-02 DIAGNOSIS — R0609 Other forms of dyspnea: Secondary | ICD-10-CM

## 2013-12-02 DIAGNOSIS — E559 Vitamin D deficiency, unspecified: Secondary | ICD-10-CM

## 2013-12-02 DIAGNOSIS — R936 Abnormal findings on diagnostic imaging of limbs: Secondary | ICD-10-CM

## 2013-12-02 DIAGNOSIS — R748 Abnormal levels of other serum enzymes: Secondary | ICD-10-CM

## 2013-12-02 LAB — COMPREHENSIVE METABOLIC PANEL
ALBUMIN: 4.1 g/dL (ref 3.5–5.2)
ALK PHOS: 126 U/L — AB (ref 39–117)
ALT: <8 U/L (ref 0–35)
AST: 13 U/L (ref 0–37)
BUN: 14 mg/dL (ref 6–23)
CO2: 28 mEq/L (ref 19–32)
Calcium: 9.3 mg/dL (ref 8.4–10.5)
Chloride: 107 mEq/L (ref 96–112)
Creat: 0.5 mg/dL (ref 0.50–1.10)
Glucose, Bld: 78 mg/dL (ref 70–99)
Potassium: 4.1 mEq/L (ref 3.5–5.3)
SODIUM: 142 meq/L (ref 135–145)
Total Bilirubin: 0.4 mg/dL (ref 0.2–1.2)
Total Protein: 7.1 g/dL (ref 6.0–8.3)

## 2013-12-02 LAB — CBC
HCT: 41 % (ref 36.0–46.0)
Hemoglobin: 13.6 g/dL (ref 12.0–15.0)
MCH: 28.5 pg (ref 26.0–34.0)
MCHC: 33.2 g/dL (ref 30.0–36.0)
MCV: 85.8 fL (ref 78.0–100.0)
PLATELETS: 248 10*3/uL (ref 150–400)
RBC: 4.78 MIL/uL (ref 3.87–5.11)
RDW: 14.4 % (ref 11.5–15.5)
WBC: 8.5 10*3/uL (ref 4.0–10.5)

## 2013-12-02 LAB — TSH: TSH: 3.274 u[IU]/mL (ref 0.350–4.500)
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
## 2013-12-03 LAB — VITAMIN D 25 HYDROXY (VIT D DEFICIENCY, FRACTURES): Vit D, 25-Hydroxy: 27 ng/mL — ABNORMAL LOW (ref 30–89)

## 2013-12-03 LAB — HIGH SENSITIVITY CRP: CRP HIGH SENSITIVITY: 0.4 mg/L

## 2013-12-03 LAB — HOMOCYSTEINE: Homocysteine: 5.9 umol/L (ref 4.0–15.4)

## 2013-12-06 ENCOUNTER — Other Ambulatory Visit: Payer: Self-pay | Admitting: Medical

## 2013-12-06 LAB — PROTEIN ELECTROPHORESIS, SERUM
ALBUMIN ELP: 57.6 % (ref 55.8–66.1)
Alpha-1-Globulin: 3.1 % (ref 2.9–4.9)
Alpha-2-Globulin: 9.7 % (ref 7.1–11.8)
BETA GLOBULIN: 5.8 % (ref 4.7–7.2)
Beta 2: 5.5 % (ref 3.2–6.5)
Gamma Globulin: 18.3 % (ref 11.1–18.8)
TOTAL PROTEIN, SERUM ELECTROPHOR: 7.1 g/dL (ref 6.0–8.3)

## 2013-12-06 MED ORDER — VITAMIN D (ERGOCALCIFEROL) 1.25 MG (50000 UNIT) PO CAPS: 50000.0000 [IU] | ORAL_CAPSULE | ORAL | Status: DC

## 2013-12-08 ENCOUNTER — Telehealth: Payer: Self-pay | Admitting: Family Medicine

## 2013-12-08 NOTE — Telephone Encounter (Signed)
PATIENT IS AWARE OF HER APPOINTMENT TO SEE DR. TILLEY ON 12/17/13 @ 930 AM. CLS [phone] OV NOTES FAX THROUGH [facility]. CLS

## 2013-12-21 ENCOUNTER — Telehealth: Payer: Self-pay | Admitting: Medical

## 2013-12-21 NOTE — Telephone Encounter (Signed)
Please call and find out why she didn't show up for her cardiology appointment to evaluate her difficulty breathing with activity and shortness of breath

## 2013-12-22 ENCOUNTER — Telehealth: Payer: Self-pay | Admitting: Family Medicine

## 2013-12-22 ENCOUNTER — Encounter: Payer: Self-pay | Admitting: Medical

## 2013-12-22 NOTE — Telephone Encounter (Signed)
Patient states that she had to Cancell her appointment because she didn't have the co-pay $85.00 dollars. She said she will go to the appointment by the end of month or the first part of June. Is it okay for her to wait until then? CLS

## 2013-12-22 NOTE — Telephone Encounter (Signed)
I left a message on her voicemail.CLS 

## 2013-12-23 NOTE — Telephone Encounter (Signed)
I assume she is still having symptoms of shortness of breath with exercise/activity correct?  If so, then yes will need baseline cardiac eval.   Have her reschedule with the cardiologist office at her convenience.

## 2013-12-23 NOTE — Telephone Encounter (Signed)
Called patient, advised importance of cardiac evaluation per [facility]. Patient states her symptoms have improved some and she is walking, and using herbal tea. She will reschedule her appointment with cardiologist in June when she has co-pay.

## 2014-01-14 ENCOUNTER — Other Ambulatory Visit: Payer: Self-pay | Admitting: Medical

## 2014-01-14 DIAGNOSIS — R0602 Shortness of breath: Secondary | ICD-10-CM

## 2014-01-17 ENCOUNTER — Ambulatory Visit: Payer: BC Managed Care – PPO | Admitting: Internal Medicine

## 2014-01-17 DIAGNOSIS — R0602 Shortness of breath: Secondary | ICD-10-CM

## 2014-01-17 LAB — PULMONARY FUNCTION TEST
FEF 25-75 Post: 1.83 L/sec
FEF 25-75 Pre: 3.1 L/sec
FEF2575-%Change-Post: -40 %
FEF2575-%PRED-POST: 82 %
FEF2575-%Pred-Pre: 138 %
FEV1-%Change-Post: -8 %
FEV1-%PRED-POST: 84 %
FEV1-%Pred-Pre: 93 %
FEV1-Post: 1.86 L
FEV1-Pre: 2.04 L
FEV1FVC-%CHANGE-POST: -1 %
FEV1FVC-%Pred-Pre: 111 %
FEV6-%Change-Post: -7 %
FEV6-%PRED-PRE: 85 %
FEV6-%Pred-Post: 79 %
FEV6-Post: 2.12 L
FEV6-Pre: 2.29 L
FEV6FVC-%PRED-POST: 103 %
FEV6FVC-%Pred-Pre: 103 %
FVC-%Change-Post: -7 %
FVC-%Pred-Post: 76 %
FVC-%Pred-Pre: 82 %
FVC-PRE: 2.29 L
FVC-Post: 2.12 L
PRE FEV6/FVC RATIO: 100 %
Post FEV1/FVC ratio: 88 %
Post FEV6/FVC ratio: 100 %
Pre FEV1/FVC ratio: 89 %
RV % PRED: 74 %
RV: 1.42 L
TLC % pred: 74 %
TLC: 3.78 L

## 2014-01-17 NOTE — Progress Notes (Signed)
PFT done today. 

## 2014-01-28 ENCOUNTER — Encounter: Payer: Self-pay | Admitting: Internal Medicine

## 2014-01-28 LAB — HM MAMMOGRAPHY

## 2014-02-08 ENCOUNTER — Encounter: Payer: Self-pay | Admitting: Cardiology

## 2014-02-08 NOTE — Progress Notes (Signed)
Patient ID: Holly [name], female   DOB: [birth year], 58 y.o.   MRN: [id number]   Holly [name]    Date of visit:  02/08/2014 DOB:  06/16/1956    Age:  58 yrs. Medical record number:  [id number]     Account number:  [id number] Primary Care Provider: Robyne [name] ____________________________ CURRENT DIAGNOSES  1. Dyspnea  2. Hyperlipidemia ____________________________ ALLERGIES  Penicillins, Rash ____________________________ MEDICATIONS  1. Vitamin D2 50,000 unit capsule, weekly  2. pravastatin 10 mg tablet, 1 p.o. daily  3. Calcium 600 + D(3) 600 mg (1,500)-200 unit tablet, 1 p.o. daily ____________________________ CHIEF COMPLAINTS  Dyspnea with long exertions ____________________________ HISTORY OF PRESENT ILLNESS This 58 year old female is seen at the request of Holly [name] for evaluation of dyspnea. The patient has a prior history of mild hyperlipidemia and occasional cigarette abuse. Over the past 6 months she has had dyspnea when walking up stairs such as doing her work as an Immunologist at [employer] or with walking up long hills. She smokes about a half a pack of cigarettes a day for about 20 years. She denies angina and has no PND, orthopnea or claudication. She was seen recently and was found to have a normal chest x-ray and in addition had a set of pulmonary function tests that were reported to show mild restriction. She is seen for cardiac followup. She has no palpitations and no syncope and denies edema. She states that she has gained about 20 pounds of weight. ____________________________ PAST HISTORY  Past Medical Illnesses:  hyperlipidemia, diverticulosis;  Cardiovascular Illnesses:  no previous history of cardiac disease.;  Surgical Procedures:  hysterectomy;  Cardiology Procedures-Invasive:  no history of prior cardiac procedures;  Cardiology Procedures-Noninvasive:  no previous non-invasive procedures;  LVEF not documented,    ____________________________ CARDIO-PULMONARY TEST DATES EKG Date:  02/08/2014;  Chest Xray Date: 11/28/2013;   ____________________________ FAMILY HISTORY Brother -- Brother alive with problem, CVA, Hypertension Brother -- Brother alive with problem, Hypertension, Congestive heart failure Brother -- Brother alive with problem, Hypertension Father -- Father alive with problem, Diabetes mellitus, Hypertension, Hypercholesterolemia Mother -- Mother alive with problem, Hypercholesterolemia, Diabetes mellitus, Hypertension Sister -- Sister alive and well ____________________________ SOCIAL HISTORY Alcohol Use:  no alcohol use;  Smoking:  smokes less than 1 ppd;  Diet:  regular diet;  Lifestyle:  married;  Exercise:  no regular exercise;  Occupation:  school bus driver;  Residence:  lives with husband;   ____________________________ REVIEW OF SYSTEMS General:  weight gain of approximately 20 lbs  Integumentary:no rashes or new skin lesions. Eyes: wears eye glasses/contact lenses Ears, Nose, Throat, Mouth:  denies any hearing loss, epistaxis, hoarseness or difficulty speaking. Respiratory: dyspnea with exertion Cardiovascular:  please review HPI Abdominal: dyspepsiaGenitourinary-Female: no dysuria, urgency, frequency, UTIs, or stress incontinence Musculoskeletal:  arthritis of the left shoulder Neurological:  denies headaches, stroke, or Holly Psychiatric:  situational stress, insomnia  ____________________________ PHYSICAL EXAMINATION VITAL SIGNS  Blood Pressure:  98/60 Sitting, Left arm, regular cuff  , 104/60 Standing, Left arm and regular cuff   Pulse:  80/min. Weight:  159.00 lbs. Height:  64"BMI: 27  Constitutional:  pleasant African American female in no acute distress Skin:  warm and dry to touch, no apparent skin lesions, or masses noted. Head:  normocephalic, normal hair pattern, no masses or tenderness Eyes:  EOMS Intact, PERRLA, C and S clear, Funduscopic exam not done. ENT:  ears,  nose and throat reveal no gross abnormalities.  Dentition good. Neck:  supple, without massess. No JVD, thyromegaly or carotid bruits. Carotid upstroke normal. Chest:  normal symmetry, clear to auscultation. Cardiac:  regular rhythm, normal S1 and S2, No S3 or S4, no murmurs, gallops or rubs detected. Abdomen:  abdomen soft,non-tender, no masses, no hepatospenomegaly, or aneurysm noted Peripheral Pulses:  the femoral,dorsalis pedis, and posterior tibial pulses are full and equal bilaterally with no bruits auscultated. Extremities & Back:  no deformities, clubbing, cyanosis, erythema or edema observed. Normal muscle strength and tone. Neurological:  no gross motor or sensory deficits noted, affect appropriate, oriented x3. ____________________________ IMPRESSIONS/PLAN  1. Unexplained dyspnea with a normal EKG and examination 2. Tobacco abuse advised to stop  Recommendations:  Obtain echocardiogram and exercise treadmill test. Advised to discontinue smoking.  EKG is normal. ____________________________ TODAYS ORDERS  1. 12 Lead EKG: Today  2. treadmill:  Regular TM At [facility]  3. 2D, color flow, doppler: First Available  4. BNP: Today                       ____________________________ Cardiology Physician:  Holly Hough MD [facility]

## 2014-03-19 DIAGNOSIS — Z9289 Personal history of other medical treatment: Secondary | ICD-10-CM

## 2014-03-19 HISTORY — DX: Personal history of other medical treatment: Z92.89

## 2014-03-22 ENCOUNTER — Telehealth: Payer: Self-pay | Admitting: Internal Medicine

## 2014-03-22 NOTE — Telephone Encounter (Signed)
Faxed over medical records to [facility]

## 2014-03-23 ENCOUNTER — Encounter: Payer: Self-pay | Admitting: Cardiology

## 2014-03-23 DIAGNOSIS — IMO0001 Reserved for inherently not codable concepts without codable children: Secondary | ICD-10-CM

## 2014-03-23 HISTORY — DX: Reserved for inherently not codable concepts without codable children: IMO0001

## 2014-03-23 NOTE — Progress Notes (Signed)
Patient ID: Holly [name], female   DOB: November 23, 1955, 58 y.o.   MRN: [id number]  [name]    Date of visit:  03/23/2014 DOB:  1956/06/16    Age:  89 yrs. Medical record number:  [id number]     Account number:  [id number] Primary Care Provider: [place]  1. Dyspnea  2. Hyperlipidemia  TREADMILL  The patient exercised on the standard Bruce protocol a total of 9 minutes into Bruce stage 3 achieving a workload of 10 METS. The test was stopped due to dyspnea and fatigue. The patient had no chest pain suggestive of angina. Heart rate rose to 161 which was 96% of predicted maximum. Blood pressure response was normal.  12-lead EKG is normal at rest. With exercise the patient achieved target heart rate and had no ST segment depression consistent with ischemia. No arrhythmias occurred.  IMPRESSIONS:  1. Clinically and EKG negative for ischemia 2. Good exercise capacity for age  RECOMMENDATIONS:  The patient has a negative adequate exercise test for age. Her echocardiogram showed no significant valvular abnormalities and LV and RV function were normal. There is no pericardial effusion. She should exercise regularly and may be reassured.   She continues to smoke and we discussed smoking cessation. Most likely her dyspnea is related to her weight gain and her tobacco. She should exercise regularly, lose down to an ideal body weight and  discontinue smoking. Thank you for asking me to see her with you.    MOST RECENT LIPID PANEL 06/29/13  CHOL TOTL 146 mg/dl, LDL 86 NM, HDL 46 mg/dl, TRIGLYCER 72 mg/dl and CHOL/HDL 3.2 (Calc)  TODAYS ORDERS  1. Return: prn                        Cardiology Physician:  Kerry Hough. MD [facility]

## 2014-03-30 ENCOUNTER — Other Ambulatory Visit: Payer: Self-pay | Admitting: Medical

## 2014-04-20 ENCOUNTER — Encounter: Payer: Self-pay | Admitting: Medical

## 2014-07-05 ENCOUNTER — Encounter: Payer: BC Managed Care – PPO | Admitting: Medical

## 2014-08-04 ENCOUNTER — Other Ambulatory Visit: Payer: Self-pay | Admitting: Medical

## 2014-08-08 ENCOUNTER — Encounter: Payer: Self-pay | Admitting: Medical

## 2014-08-08 ENCOUNTER — Telehealth: Payer: Self-pay | Admitting: Medical

## 2014-08-08 ENCOUNTER — Ambulatory Visit (INDEPENDENT_AMBULATORY_CARE_PROVIDER_SITE_OTHER): Payer: BC Managed Care – PPO | Admitting: Medical

## 2014-08-08 VITALS — BP 120/82 | HR 70 | Temp 98.0°F | Resp 16 | Ht 64.0 in | Wt 159.0 lb

## 2014-08-08 DIAGNOSIS — F17200 Nicotine dependence, unspecified, uncomplicated: Secondary | ICD-10-CM | POA: Insufficient documentation

## 2014-08-08 DIAGNOSIS — Z2821 Immunization not carried out because of patient refusal: Secondary | ICD-10-CM

## 2014-08-08 DIAGNOSIS — E559 Vitamin D deficiency, unspecified: Secondary | ICD-10-CM

## 2014-08-08 DIAGNOSIS — N951 Menopausal and female climacteric states: Secondary | ICD-10-CM | POA: Insufficient documentation

## 2014-08-08 DIAGNOSIS — Z720 Tobacco use: Secondary | ICD-10-CM

## 2014-08-08 DIAGNOSIS — E785 Hyperlipidemia, unspecified: Secondary | ICD-10-CM

## 2014-08-08 DIAGNOSIS — Z0000 Encounter for general adult medical examination without abnormal findings: Secondary | ICD-10-CM

## 2014-08-08 DIAGNOSIS — R8290 Unspecified abnormal findings in urine: Secondary | ICD-10-CM

## 2014-08-08 DIAGNOSIS — B009 Herpesviral infection, unspecified: Secondary | ICD-10-CM

## 2014-08-08 LAB — COMPREHENSIVE METABOLIC PANEL
ALK PHOS: 127 U/L — AB (ref 39–117)
ALT: <8 U/L (ref 0–35)
AST: 13 U/L (ref 0–37)
Albumin: 4.1 g/dL (ref 3.5–5.2)
BUN: 13 mg/dL (ref 6–23)
CO2: 28 mEq/L (ref 19–32)
CREATININE: 0.49 mg/dL — AB (ref 0.50–1.10)
Calcium: 9.3 mg/dL (ref 8.4–10.5)
Chloride: 106 mEq/L (ref 96–112)
Glucose, Bld: 89 mg/dL (ref 70–99)
Potassium: 4.5 mEq/L (ref 3.5–5.3)
Sodium: 143 mEq/L (ref 135–145)
Total Bilirubin: 0.5 mg/dL (ref 0.2–1.2)
Total Protein: 6.9 g/dL (ref 6.0–8.3)

## 2014-08-08 LAB — LIPID PANEL
CHOLESTEROL: 145 mg/dL (ref 0–200)
HDL: 40 mg/dL (ref >39)
LDL CALC: 88 mg/dL (ref 0–99)
Total CHOL/HDL Ratio: 3.6 Ratio
Triglycerides: 85 mg/dL (ref <150)
VLDL: 17 mg/dL (ref 0–40)

## 2014-08-08 LAB — POCT URINALYSIS DIPSTICK
Bilirubin, UA: NEGATIVE
Glucose, UA: NEGATIVE
KETONES UA: NEGATIVE
Nitrite, UA: NEGATIVE
PH UA: 8
PROTEIN UA: NEGATIVE
SPEC GRAV UA: 1.015
Urobilinogen, UA: NEGATIVE

## 2014-08-08 LAB — CBC
HEMATOCRIT: 41.1 % (ref 36.0–46.0)
Hemoglobin: 13.4 g/dL (ref 12.0–15.0)
MCH: 28.1 pg (ref 26.0–34.0)
MCHC: 32.6 g/dL (ref 30.0–36.0)
MCV: 86.2 fL (ref 78.0–100.0)
MPV: 11 fL (ref 9.4–12.4)
Platelets: 233 10*3/uL (ref 150–400)
RBC: 4.77 MIL/uL (ref 3.87–5.11)
RDW: 14.3 % (ref 11.5–15.5)
WBC: 7.1 10*3/uL (ref 4.0–10.5)

## 2014-08-08 MED ORDER — NAPROXEN 500 MG PO TABS: 500.0000 mg | ORAL_TABLET | Freq: Two times a day (BID) | ORAL | Status: DC

## 2014-08-08 MED ORDER — CLONIDINE HCL 0.1 MG PO TABS: 0.1000 mg | ORAL_TABLET | Freq: Every day | ORAL | Status: DC

## 2014-08-08 MED ORDER — VALACYCLOVIR HCL 1 G PO TABS: ORAL_TABLET | ORAL | Status: DC

## 2014-08-08 MED ORDER — METHOCARBAMOL 500 MG PO TABS: 500.0000 mg | ORAL_TABLET | Freq: Three times a day (TID) | ORAL | Status: DC | PRN

## 2014-08-08 NOTE — Telephone Encounter (Signed)
Patient will call back with insurance information.

## 2014-08-08 NOTE — Telephone Encounter (Signed)
She will check insurance coverage regarding bone density scan and let us know.  If more beneficial for her, we can try and schedule before end of year 2015.

## 2014-08-08 NOTE — Progress Notes (Signed)
Subjective:   HPI  Holly [name] is a 58 y.o. female who presents for a complete physical.   Preventative care: Last ophthalmology visit:YES LAST EYE EXAM 04/2014 Last dental visit:YES Last colonoscopy: in recent years Last mammogram:01/2014 Last gynecological exam:? Last EKG:06/2012 Last labs:2014  Prior vaccinations: TD or Tdap:2014 Influenza:DECLINED FLU VACCINE Pneumococcal:23- 11/13 Shingles/Zostavax:N/A  Concerns: Had SOB back in august, ended up having stress test, but doing fine now.  Thinks it was related to stress.  Smoking still.  Has cut back some.  Menopause - uses Clonidine QHS for hot flashes and sleep.  Works ok.    Vitamin D deficiency - took the 3 months of prescription Vit D.  Stopped this a month ago when she ran out but is currently using OTC Vit D daily  Gets occasional blister in left ear from herpetic lesion, uses Valtrex prn.  Reviewed their medical, surgical, family, social, medication, and allergy history and updated chart as appropriate.  Past Medical History  Diagnosis Date  . Hyperlipidemia   . Wears glasses   . Tobacco use disorder   . Herpetic lesion     hx/o herpetic lesion right hand and left ear? gets once to twice yearly  . Menopausal disorder   . Vitamin D deficiency   . History of mammogram 01/2014    normal  . Sleep difficulties     due to menopause  . Chronic constipation     enrolled in a constipation study with [facility] 01/2013, on Plecanatide 6mg  daily  . Microscopic hematuria 04/2013    urology eval, no worrisome causes.  recheck only with frank hematuria.  . S/P hysterectomy     due to fibroids  . Diverticulosis 01/2013    on colonoscopy  . Normal cardiac stress test 03/23/14    Dr. Tollie Eth  . H/O echocardiogram 03/2014    normal, Dr. Tollie Eth  . SOB (shortness of breath)     cardiac and PFT eval 03/2014    Past Surgical History  Procedure Laterality Date  . Partial hysterectomy  1989    still has 1  ovary; hx/o uterine fibroids  . Ectopic pregnancy surgery  1990  . Colonoscopy  01/2013    external hemorrohids, severe melanosis, diverticulosis; Dr. Deatra Ina.  prior colonoscopy Dr. Collene Mares.    History   Social History  . Marital Status: Married    Spouse Name: N/A    Number of Children: N/A  . Years of Education: N/A   Occupational History  . bus driver [place] History Main Topics  . Smoking status: Current Every Day Smoker -- 0.30 packs/day for 15 years    Types: Cigarettes  . Smokeless tobacco: Never Used  . Alcohol Use: No  . Drug Use: No  . Sexual Activity: Not on file     Comment: married, 2 chlidren, school bus driver, exercise with walking   Other Topics Concern  . Not on file   Social History Narrative   Bus driver, works at [employer], exercises with walking, married, has 2 children, ages 30yo and 59yo, both live in [place], 3 grandchildren    Family History  Problem Relation Age of Onset  . Hyperlipidemia Mother   . Hypertension Mother   . Diabetes Mother   . Hyperlipidemia Father   . Hypertension Father   . Diabetes Father   . Stroke Brother     during surgery  . Cancer Neg Hx   . Heart disease  Neg Hx     Current outpatient prescriptions: calcium-vitamin D (OSCAL-500) 500-400 MG-UNIT per tablet, Take 1 tablet by mouth 2 (two) times daily., Disp: 180 tablet, Rfl: 3;  cloNIDine (CATAPRES) 0.1 MG tablet, take 1 tablet by mouth once daily, Disp: 90 tablet, Rfl: 0;  methocarbamol (ROBAXIN) 500 MG tablet, Take 1 tablet (500 mg total) by mouth 3 (three) times daily between meals as needed., Disp: 20 tablet, Rfl: 0 naproxen (NAPROSYN) 500 MG tablet, Take 1 tablet (500 mg total) by mouth 2 (two) times daily., Disp: 30 tablet, Rfl: 0;  pravastatin (PRAVACHOL) 40 MG tablet, Take 1 tablet (40 mg total) by mouth daily., Disp: 90 tablet, Rfl: 0;  Vitamin D, Ergocalciferol, (DRISDOL) 50000 UNITS CAPS capsule, take 1 capsule by mouth every week,  Disp: 4 capsule, Rfl: 3  Allergies  Allergen Reactions  . Penicillins Rash     Review of Systems Constitutional: -fever, -chills, -sweats, -unexpected weight change, -decreased appetite, -fatigue Allergy: -sneezing, -itching, -congestion Dermatology: -changing moles, --rash, -lumps ENT: -runny nose, -ear pain, -sore throat, -hoarseness, -sinus pain, -teeth pain, - ringing in ears, -hearing loss, -nosebleeds Cardiology: -chest pain, -palpitations, -swelling, -difficulty breathing when lying flat, -waking up short of breath Respiratory: -cough, -shortness of breath, -difficulty breathing with exercise or exertion, -wheezing, -coughing up blood Gastroenterology: -abdominal pain, -nausea, -vomiting, -diarrhea, -constipation, -blood in stool, -changes in bowel movement, -difficulty swallowing or eating Hematology: -bleeding, -bruising  Musculoskeletal: -joint aches, -muscle aches, -joint swelling, -back pain, -neck pain, -cramping, -changes in gait Ophthalmology: denies vision changes, eye redness, itching, discharge Urology: -burning with urination, -difficulty urinating, -blood in urine, -urinary frequency, -urgency, -incontinence Neurology: -headache, -weakness, -tingling, -numbness, -memory loss, -falls, -dizziness Psychology: -depressed mood, -agitation, -sleep problems     Objective:   Physical Exam  BP 120/82 mmHg  Pulse 70  Temp(Src) 98 F (36.7 C) (Oral)  Resp 16  Ht 5\' 4"  (1.626 m)  Wt 159 lb (72.122 kg)  BMI 27.28 kg/m2  General appearance: alert, no distress, WD/WN, pleasant AA female Skin: no worrisome lesions, few scattered benign appearing moles, right ear pinna just outside canal posteriorly with few small round scars from prior herpetic lesions HEENT: normocephalic, conjunctiva/corneas normal, sclerae anicteric, PERRLA, EOMi, nares patent, no discharge or erythema, pharynx normal Oral cavity: MMM, tongue normal, teeth in good repair Neck: supple, no  lymphadenopathy, no thyromegaly, no masses, normal ROM,no bruits Chest: non tender, normal shape and expansion Heart: RRR, normal S1, S2, no murmurs Lungs: CTA bilaterally, no wheezes, rhonchi, or rales Abdomen: +bs, soft, non tender, non distended, no masses, no hepatomegaly, no splenomegaly, no bruits Back: non tender, normal ROM, no scoliosis Musculoskeletal: upper extremities non tender, no obvious deformity, normal ROM throughout, lower extremities non tender, no obvious deformity, normal ROM throughout Extremities: no edema, no cyanosis, no clubbing Pulses: 2+ symmetric, upper and lower extremities, normal cap refill Neurological: alert, oriented x 3, CN2-12 intact, strength normal upper extremities and lower extremities, sensation normal throughout, DTRs 2+ throughout, no cerebellar signs, gait normal Psychiatric: normal affect, behavior normal, pleasant  Breast: deferred at patient request today, reviewed 01/2014 mammogram Gyn: deferred at patient request today, reviewed exam notes from 2014. Rectal: deferred    Assessment and Plan :    Encounter Diagnoses  Name Primary?  . Encounter for health maintenance examination in adult Yes  . Hyperlipidemia   . Smoker   . Vitamin D deficiency   . Menopausal state   . Influenza vaccine refused   . Herpetic lesion   .  Abnormal urinalysis     Physical exam - discussed healthy lifestyle, diet, exercise, preventative care, vaccinations, and addressed their concerns.  Handout given. See your eye doctor yearly for routine vision care. See your dentist yearly for routine dental care including hygiene visits twice yearly. Hyperlipidemia - compliant with medication, labs today Smoker - encouraged to stop tobacco given the risks Vit D deficiency - labs today, compliant with medication Menopausal state - taking Clonidine.  Discussed other possible therapies Declines flu shot Given urine PH 8 and bacteria, she will stop the body wash that is  causing irritation and recheck UA in 2 weeks Herpetic lesion - c/t valtrex prn. Discussed bone density screening.  Risks factors include smoker, vit D deficiency, menopause.  She will check insurance coverage and let us know Follow-up pending labs

## 2014-08-09 ENCOUNTER — Other Ambulatory Visit: Payer: Self-pay | Admitting: Medical

## 2014-08-09 LAB — VITAMIN D 25 HYDROXY (VIT D DEFICIENCY, FRACTURES): Vit D, 25-Hydroxy: 23 ng/mL — ABNORMAL LOW (ref 30–100)

## 2014-08-09 MED ORDER — VITAMIN D (ERGOCALCIFEROL) 1.25 MG (50000 UNIT) PO CAPS: 50000.0000 [IU] | ORAL_CAPSULE | ORAL | Status: DC

## 2014-08-09 MED ORDER — PRAVASTATIN SODIUM 40 MG PO TABS: 40.0000 mg | ORAL_TABLET | Freq: Every day | ORAL | Status: DC

## 2014-08-10 ENCOUNTER — Other Ambulatory Visit: Payer: Self-pay | Admitting: Medical

## 2014-08-10 MED ORDER — ERGOCALCIFEROL 50 MCG (2000 UT) PO TABS: 1.0000 | ORAL_TABLET | Freq: Every day | ORAL | Status: DC

## 2014-10-25 ENCOUNTER — Encounter: Payer: Self-pay | Admitting: Medical

## 2014-10-25 ENCOUNTER — Ambulatory Visit (INDEPENDENT_AMBULATORY_CARE_PROVIDER_SITE_OTHER): Payer: BC Managed Care – PPO | Admitting: Medical

## 2014-10-25 VITALS — BP 110/80 | HR 69 | Temp 98.0°F | Resp 14 | Wt 159.0 lb

## 2014-10-25 DIAGNOSIS — R059 Cough, unspecified: Secondary | ICD-10-CM

## 2014-10-25 DIAGNOSIS — J22 Unspecified acute lower respiratory infection: Secondary | ICD-10-CM

## 2014-10-25 DIAGNOSIS — R05 Cough: Secondary | ICD-10-CM

## 2014-10-25 DIAGNOSIS — J988 Other specified respiratory disorders: Secondary | ICD-10-CM | POA: Diagnosis not present

## 2014-10-25 MED ORDER — HYDROCODONE-HOMATROPINE 5-1.5 MG/5ML PO SYRP: 5.0000 mL | ORAL_SOLUTION | Freq: Three times a day (TID) | ORAL | Status: DC | PRN

## 2014-10-25 MED ORDER — AZITHROMYCIN 250 MG PO TABS: ORAL_TABLET | ORAL | Status: DC

## 2014-10-25 NOTE — Progress Notes (Signed)
Subjective:  Holly [name] is a 59 y.o. female who presents for illness.  Symptoms include 5 day hx/o cough, blood and yellow mucoid drainage from nose, ear pain, worse cough at night.  Denies fever, NVD, no sore throat, no SOB or wheezing.  Patient is a smoker but not even daily at current.   Using numenous things OTC for symptoms.  Denies sick contacts.  No other aggravating or relieving factors.  No other c/o.  ROS as in subjective   Objective: Filed Vitals:   10/25/14 0935  BP: 110/80  Pulse: 69  Temp: 98 F (36.7 C)  Resp: 14    General appearance: Alert, WD/WN, no distress                             Skin: warm, no rash                           Head: +mild frontal sinus tenderness,                            Eyes: conjunctiva normal, corneas clear, PERRLA                            Ears: pearly TMs, external ear canals normal                          Nose: septum midline, turbinates swollen, with erythema and mucoid discharge             Mouth/throat: MMM, tongue normal, mild pharyngeal erythema                           Neck: supple, no adenopathy, no thyromegaly, nontender                          Heart: RRR, normal S1, S2, no murmurs                         Lungs: CTA bilaterally, no wheezes, rales, or rhonchi      Assessment and Plan: Encounter Diagnoses  Name Primary?  . Acute respiratory infection Yes  . Cough     Prescription given for Zpak, hycodan syrup.  Can use OTC Mucinex for congestion.  Tylenol or Ibuprofen OTC for fever and malaise.  Discussed symptomatic relief, nasal saline flush, and call or return if worse or not improving in 2-3 days.

## 2014-12-07 ENCOUNTER — Telehealth: Payer: Self-pay | Admitting: Family Medicine

## 2014-12-07 NOTE — Telephone Encounter (Signed)
Pt called and set up appt for labs to follow up from Vit d.  Please put in future orders

## 2014-12-08 ENCOUNTER — Other Ambulatory Visit: Payer: Self-pay | Admitting: Medical

## 2014-12-08 DIAGNOSIS — E559 Vitamin D deficiency, unspecified: Secondary | ICD-10-CM

## 2014-12-09 ENCOUNTER — Ambulatory Visit: Payer: BC Managed Care – PPO | Admitting: Family Medicine

## 2015-02-01 LAB — HM MAMMOGRAPHY

## 2015-02-02 ENCOUNTER — Encounter: Payer: Self-pay | Admitting: Internal Medicine

## 2015-02-02 ENCOUNTER — Telehealth: Payer: Self-pay | Admitting: Medical

## 2015-02-02 NOTE — Telephone Encounter (Signed)
Mammogram normal.   Make sure she is aware

## 2015-02-03 NOTE — Telephone Encounter (Signed)
I left the patient a voicemail about her mammogram results.

## 2015-04-13 ENCOUNTER — Emergency Department (HOSPITAL_COMMUNITY)
Admission: EM | Admit: 2015-04-13 | Discharge: 2015-04-13 | Disposition: A | Discharge status: Home / Self Care | Payer: Worker's Compensation | Attending: Emergency Medicine | Admitting: Emergency Medicine

## 2015-04-13 ENCOUNTER — Encounter (HOSPITAL_COMMUNITY): Payer: Self-pay | Admitting: Family Medicine

## 2015-04-13 ENCOUNTER — Emergency Department (HOSPITAL_COMMUNITY): Payer: Worker's Compensation

## 2015-04-13 DIAGNOSIS — L0291 Cutaneous abscess, unspecified: Secondary | ICD-10-CM

## 2015-04-13 DIAGNOSIS — Z8619 Personal history of other infectious and parasitic diseases: Secondary | ICD-10-CM | POA: Diagnosis not present

## 2015-04-13 DIAGNOSIS — Z8719 Personal history of other diseases of the digestive system: Secondary | ICD-10-CM | POA: Diagnosis not present

## 2015-04-13 DIAGNOSIS — W010XXA Fall on same level from slipping, tripping and stumbling without subsequent striking against object, initial encounter: Secondary | ICD-10-CM | POA: Insufficient documentation

## 2015-04-13 DIAGNOSIS — Z720 Tobacco use: Secondary | ICD-10-CM | POA: Diagnosis not present

## 2015-04-13 DIAGNOSIS — Y9289 Other specified places as the place of occurrence of the external cause: Secondary | ICD-10-CM | POA: Diagnosis not present

## 2015-04-13 DIAGNOSIS — Z8669 Personal history of other diseases of the nervous system and sense organs: Secondary | ICD-10-CM | POA: Diagnosis not present

## 2015-04-13 DIAGNOSIS — Y9389 Activity, other specified: Secondary | ICD-10-CM | POA: Insufficient documentation

## 2015-04-13 DIAGNOSIS — Z79899 Other long term (current) drug therapy: Secondary | ICD-10-CM | POA: Insufficient documentation

## 2015-04-13 DIAGNOSIS — S8991XA Unspecified injury of right lower leg, initial encounter: Secondary | ICD-10-CM

## 2015-04-13 DIAGNOSIS — Z8742 Personal history of other diseases of the female genital tract: Secondary | ICD-10-CM | POA: Diagnosis not present

## 2015-04-13 DIAGNOSIS — Z880 Allergy status to penicillin: Secondary | ICD-10-CM | POA: Insufficient documentation

## 2015-04-13 DIAGNOSIS — E559 Vitamin D deficiency, unspecified: Secondary | ICD-10-CM | POA: Diagnosis not present

## 2015-04-13 DIAGNOSIS — Y998 Other external cause status: Secondary | ICD-10-CM | POA: Insufficient documentation

## 2015-04-13 DIAGNOSIS — L02415 Cutaneous abscess of right lower limb: Secondary | ICD-10-CM | POA: Diagnosis not present

## 2015-04-13 MED ORDER — CEPHALEXIN 500 MG PO CAPS: 500.0000 mg | ORAL_CAPSULE | Freq: Four times a day (QID) | ORAL | Status: DC

## 2015-04-13 MED ORDER — LIDOCAINE-EPINEPHRINE 1 %-1:100000 IJ SOLN
10.0000 mL | Freq: Once | INTRAMUSCULAR | Status: AC
  Administered 2015-04-13: 10 mL
  Filled 2015-04-13: qty 1

## 2015-04-13 NOTE — ED Notes (Signed)
Pt sts she was working last Thursday and tripped and fell and injured RLE. sts swelling is better but some draining.

## 2015-04-13 NOTE — ED Provider Notes (Signed)
CSN: [id number]     Arrival date & time 04/13/15  1053 History  This chart was scribed for non-physician practitioner Delsa Grana, PA-C working with Gareth Morgan, MD by Zola Button, ED Scribe. This patient was seen in room TR09C/TR09C and the patient's care was started at 12:12 PM.       Chief Complaint  Patient presents with  . Fall  . Leg Injury   The history is provided by the patient. No language interpreter was used.   HPI Comments: Holly [name] is a 59 y.o. female who presents to the Emergency Department complaining of a fall that occurred 1 week ago. Patient injured her right leg and sustained an abrasion to her right shin after tripping and falling last week. The pain is not worse with movement.  The swelling has improved, but she has noticed pus drainage from the wound site and tingling to the area. She has been using EtOH, peroxide and an OTC cream to the area, multiple times a day. Patient denies fever, chills, nausea, redness, weakness, numbness.  She has come today for evaluation because she is concerned that she may not be able to work - she is a Teacher, early years/pre.   Past Medical History  Diagnosis Date  . Hyperlipidemia   . Wears glasses   . Tobacco use disorder   . Herpetic lesion     hx/o herpetic lesion right hand and left ear? gets once to twice yearly  . Menopausal disorder   . Vitamin D deficiency   . History of mammogram 01/2014    normal  . Sleep difficulties     due to menopause  . Chronic constipation     enrolled in a constipation study with [facility] 01/2013, on Plecanatide 6mg  daily  . Microscopic hematuria 04/2013    urology eval, no worrisome causes.  recheck only with frank hematuria.  . S/P hysterectomy     due to fibroids  . Diverticulosis 01/2013    on colonoscopy  . Normal cardiac stress test 03/23/14    Dr. Tollie Eth  . H/O echocardiogram 03/2014    normal, Dr. Tollie Eth  . SOB (shortness of breath)     cardiac and PFT eval 03/2014    Past Surgical History  Procedure Laterality Date  . Partial hysterectomy  1989    still has 1 ovary; hx/o uterine fibroids  . Ectopic pregnancy surgery  1990  . Colonoscopy  01/2013    external hemorrohids, severe melanosis, diverticulosis; Dr. Deatra Ina.  prior colonoscopy Dr. Collene Mares.   Family History  Problem Relation Age of Onset  . Hyperlipidemia Mother   . Hypertension Mother   . Diabetes Mother   . Hyperlipidemia Father   . Hypertension Father   . Diabetes Father   . Stroke Brother     during surgery  . Cancer Neg Hx   . Heart disease Neg Hx    Social History  Substance Use Topics  . Smoking status: Current Every Day Smoker -- 0.30 packs/day for 15 years    Types: Cigarettes  . Smokeless tobacco: Never Used  . Alcohol Use: No   OB History    No data available     Review of Systems  Constitutional: Negative.   HENT: Negative.   Respiratory: Negative.   Cardiovascular: Negative.   Gastrointestinal: Negative.   Musculoskeletal: Negative.   Skin: Positive for wound. Negative for pallor and rash.      Allergies  Penicillins  Home Medications  Prior to Admission medications   Medication Sig Start Date End Date Taking? Authorizing Provider  azithromycin (ZITHROMAX) 250 MG tablet 2 tablets day 1, then 1 tablet days 2-4 10/25/14   Camelia Eng Tysinger, PA-C  calcium-vitamin D (OSCAL-500) 500-400 MG-UNIT per tablet Take 1 tablet by mouth 2 (two) times daily. 06/30/13   Camelia Eng Tysinger, PA-C  cephALEXin (KEFLEX) 500 MG capsule Take 1 capsule (500 mg total) by mouth 4 (four) times daily. 04/13/15   Delsa Grana, PA-C  cloNIDine (CATAPRES) 0.1 MG tablet Take 1 tablet (0.1 mg total) by mouth daily. 08/08/14   Camelia Eng Tysinger, PA-C  HYDROcodone-homatropine (HYCODAN) 5-1.5 MG/5ML syrup Take 5 mLs by mouth every 8 (eight) hours as needed for cough. 10/25/14   Camelia Eng Tysinger, PA-C  methocarbamol (ROBAXIN) 500 MG tablet Take 1 tablet (500 mg total) by mouth 3 (three) times daily  between meals as needed. Patient not taking: Reported on 10/25/2014 08/08/14   Camelia Eng Tysinger, PA-C  naproxen (NAPROSYN) 500 MG tablet Take 1 tablet (500 mg total) by mouth 2 (two) times daily. Patient not taking: Reported on 10/25/2014 08/08/14   Camelia Eng Tysinger, PA-C  pravastatin (PRAVACHOL) 40 MG tablet Take 1 tablet (40 mg total) by mouth daily. 08/09/14   Camelia Eng Tysinger, PA-C  valACYclovir (VALTREX) 1000 MG tablet 2 tablet po BID x 1 day for flare up 08/08/14   Camelia Eng Tysinger, PA-C  Vitamin D, Ergocalciferol, (DRISDOL) 50000 UNITS CAPS capsule Take 1 capsule (50,000 Units total) by mouth once a week. 08/09/14   Camelia Eng Tysinger, PA-C   BP 141/74 mmHg  Pulse 70  Temp(Src) 98.1 F (36.7 C) (Oral)  Resp 16  Ht 5' 3.5" (1.613 m)  Wt 160 lb (72.576 kg)  BMI 27.89 kg/m2  SpO2 100% Physical Exam  Constitutional: She is oriented to person, place, and time. She appears well-developed and well-nourished. No distress.  HENT:  Head: Normocephalic and atraumatic.  Right Ear: External ear normal.  Left Ear: External ear normal.  Nose: Nose normal.  Mouth/Throat: Oropharynx is clear and moist. No oropharyngeal exudate.  Eyes: Conjunctivae and EOM are normal. Pupils are equal, round, and reactive to light. Right eye exhibits no discharge. Left eye exhibits no discharge. No scleral icterus.  Neck: Normal range of motion. Neck supple. No JVD present. No tracheal deviation present.  Cardiovascular: Normal rate and regular rhythm.   Pulmonary/Chest: Effort normal and breath sounds normal. No stridor. No respiratory distress.  Musculoskeletal: Normal range of motion. She exhibits no edema.  Lymphadenopathy:    She has no cervical adenopathy.  Neurological: She is alert and oriented to person, place, and time. She exhibits normal muscle tone. Coordination normal.  Skin: Skin is warm and dry. No rash noted. She is not diaphoretic. No erythema. No pallor.  Multiple healing abrasions along the right  shin.  No edema, induration, erythema.  The most superior abrasion is approximately 2 x 0.5 cm, with some adjacent full and fluctuance of skin lateral  Psychiatric: She has a normal mood and affect. Her behavior is normal. Judgment and thought content normal.    ED Course  Procedures  DIAGNOSTIC STUDIES: Oxygen Saturation is 97% on room air, normal by my interpretation.    COORDINATION OF CARE: 12:18 PM-Discussed treatment plan which includes US imaging with patient/guardian at bedside and patient/guardian agreed to plan.   EMERGENCY DEPARTMENT US SOFT TISSUE INTERPRETATION "Study: Limited Ultrasound of the noted body part in comments below"  INDICATIONS: Other (  refer to comments) Multiple views of the body part are obtained with a multi-frequency linear probe  PERFORMED BY:  Myself  IMAGES ARCHIVED?: Yes  SIDE:Right   BODY PART:Lower extremity  FINDINGS: Other fluid with smooth, discreet oblong edges  LIMITATIONS:  none  INTERPRETATION:  Abcess present  COMMENT:  Right shin wound with drainage  INCISION AND DRAINAGE Performed by: Delsa Grana Consent: Verbal consent obtained. Risks and benefits: risks, benefits and alternatives were discussed Time out performed prior to procedure Type: abscess Body area: right shin Anesthesia: local infiltration Incision was made with a scalpel. Local anesthetic: lidocaine 1% w epinephrine Anesthetic total: 4 ml Complexity: simple Blunt dissection to attempt to break up loculations Drainage:  blood Drainage amount: scant Packing material: none Patient tolerance: Patient tolerated the procedure well with no immediate complications. Labs Review Labs Reviewed - No data to display  Imaging Review No results found. I have personally reviewed and evaluated these images and lab results as part of my medical decision-making.   EKG Interpretation None      MDM   Final diagnoses:  Injury of right shin, initial encounter   Abscess    Pt with 1 wk old injury to right shin - she fell and scratched her shin, was evaluated initially by EMS, but was not evaluated by PCP or ER.   Skin appears to be healing.  No evidence of infection, but just lateral to largest, most superior abrasion/scab, there is an area with fluctuance.  No spontaneous drainage currently, and with mild pressure, no drainage is expressed. Will obtain tib/fib XR to r/o bony injury or foreign body Bedside ultrasound shows a fluid collection just lateral to shin abrasion.  May need I&D.  First will attempt needle aspiration. Pt was numbed, site cleaned, needle aspiration unsuccessful.  [place] used to guide 2nd attempt of aspiration to proper depth. Area was cleaned again, small incision was made with scalple - <1cm puncture.  No purulent drainage, able to insert hemostat into space, approximately 1 cm deep and also 1-2 cm superiorly and inferiorly. Pt has minimal bleeding from site.  Pt was educated about wound care and I&D, advised to have it checked again this week.  I personally performed the services described in this documentation, which was scribed in my presence. The recorded information has been reviewed and is accurate.    Delsa Grana, PA-C 04/17/15 [id number]  Gareth Morgan, MD 04/17/15 [phone]

## 2015-04-13 NOTE — Discharge Instructions (Signed)

## 2015-04-18 ENCOUNTER — Ambulatory Visit (INDEPENDENT_AMBULATORY_CARE_PROVIDER_SITE_OTHER): Payer: BC Managed Care – PPO | Admitting: Family Medicine

## 2015-04-18 ENCOUNTER — Encounter: Payer: Self-pay | Admitting: Family Medicine

## 2015-04-18 VITALS — BP 142/92 | HR 76 | Temp 98.1°F | Wt 156.6 lb

## 2015-04-18 DIAGNOSIS — F17200 Nicotine dependence, unspecified, uncomplicated: Secondary | ICD-10-CM

## 2015-04-18 DIAGNOSIS — J209 Acute bronchitis, unspecified: Secondary | ICD-10-CM | POA: Diagnosis not present

## 2015-04-18 DIAGNOSIS — Z9109 Other allergy status, other than to drugs and biological substances: Secondary | ICD-10-CM

## 2015-04-18 DIAGNOSIS — Z720 Tobacco use: Secondary | ICD-10-CM | POA: Diagnosis not present

## 2015-04-18 DIAGNOSIS — Z91048 Other nonmedicinal substance allergy status: Secondary | ICD-10-CM

## 2015-04-18 MED ORDER — HYDROCODONE-HOMATROPINE 5-1.5 MG/5ML PO SYRP: 5.0000 mL | ORAL_SOLUTION | Freq: Every evening | ORAL | Status: DC | PRN

## 2015-04-18 NOTE — Patient Instructions (Addendum)
Keep taking your Zyrtec and try using a saline nasal spray at night before you go to bed and the Rhinocort nasal spray. Stay hydrated and try to avoid allergens that trigger your cough. Take the cough medicine at night as needed. Let us know if you are not feeling better in 2 or 3 days and definitely let us know if you get worse.

## 2015-04-18 NOTE — Progress Notes (Signed)
   Subjective:    Patient ID: Holly [name], female    DOB: 03-23-1956, 59 y.o.   MRN: [id number]  HPI She is here with a 7 day history of a constant dry cough with occasional clear phlegm, headache when she coughs, and post nasal drainage . She states she is coughing and not able to sleep. She denies fever, chills, sore throat, ear pain, rhinorrhea. She has been taking Keflex for a leg injury since last Thursday. She states she has environmental allergies that makes her cough and reports that perfumes and colognes have been triggering cough the past 2 days. Using zyrtec and Robitusson. She takes Clonidine for hot flashes and states she does not have high blood pressure.    Review of Systems Pertinent positives and negatives in HPI.     Objective:   Physical Exam  [name] and in no distress. No sinus tenderness.Tympanic membranes and canals are normal. Pharyngeal area is normal. Neck is supple without adenopathy.  Cardiac exam shows a regular sinus rhythm without murmurs or gallops. Lungs are clear to auscultation.       Assessment & Plan:  Acute bronchitis, unspecified organism  Smoker  Environmental allergies  She will continue taking Zyrtec and will start using Rhinocort nasal spray daily. May also try saline nasal spray but not at same time as Rhinocort. She will try to avoid strong perfumes if possible since she has noticed this to be a trigger for cough. May use cough medicine if needed at night if cough keeping her awake. She is aware that her blood pressure is elevated today and she will check it at later when she isn't coughing or stressed and keep [place] posted if it continues to be high.

## 2015-08-11 ENCOUNTER — Encounter: Payer: Self-pay | Admitting: Medical

## 2015-08-11 ENCOUNTER — Ambulatory Visit (INDEPENDENT_AMBULATORY_CARE_PROVIDER_SITE_OTHER): Payer: BC Managed Care – PPO | Admitting: Medical

## 2015-08-11 VITALS — BP 122/88 | HR 84 | Ht 63.75 in | Wt 154.0 lb

## 2015-08-11 DIAGNOSIS — L259 Unspecified contact dermatitis, unspecified cause: Secondary | ICD-10-CM | POA: Diagnosis not present

## 2015-08-11 DIAGNOSIS — Z0000 Encounter for general adult medical examination without abnormal findings: Secondary | ICD-10-CM

## 2015-08-11 DIAGNOSIS — E559 Vitamin D deficiency, unspecified: Secondary | ICD-10-CM

## 2015-08-11 DIAGNOSIS — E785 Hyperlipidemia, unspecified: Secondary | ICD-10-CM

## 2015-08-11 DIAGNOSIS — N951 Menopausal and female climacteric states: Secondary | ICD-10-CM | POA: Diagnosis not present

## 2015-08-11 DIAGNOSIS — F17200 Nicotine dependence, unspecified, uncomplicated: Secondary | ICD-10-CM | POA: Diagnosis not present

## 2015-08-11 LAB — LIPID PANEL
CHOL/HDL RATIO: 4.1 ratio (ref <=5.0)
Cholesterol: 173 mg/dL (ref 125–200)
HDL: 42 mg/dL — ABNORMAL LOW (ref >=46)
LDL CALC: 112 mg/dL (ref <130)
Triglycerides: 96 mg/dL (ref <150)
VLDL: 19 mg/dL (ref <30)

## 2015-08-11 LAB — POCT URINALYSIS DIPSTICK
BILIRUBIN UA: NEGATIVE
Glucose, UA: NEGATIVE
Ketones, UA: NEGATIVE
Leukocytes, UA: NEGATIVE
Nitrite, UA: NEGATIVE
PH UA: 7.5
Protein, UA: NEGATIVE
RBC UA: NEGATIVE
SPEC GRAV UA: 1.02
UROBILINOGEN UA: NEGATIVE

## 2015-08-11 LAB — COMPREHENSIVE METABOLIC PANEL
ALT: 10 U/L (ref 6–29)
AST: 15 U/L (ref 10–35)
Albumin: 3.5 g/dL — ABNORMAL LOW (ref 3.6–5.1)
Alkaline Phosphatase: 86 U/L (ref 33–130)
BUN: 12 mg/dL (ref 7–25)
CHLORIDE: 111 mmol/L — AB (ref 98–110)
CO2: 25 mmol/L (ref 20–31)
CREATININE: 0.56 mg/dL (ref 0.50–1.05)
Calcium: 8.8 mg/dL (ref 8.6–10.4)
GLUCOSE: 89 mg/dL (ref 65–99)
Potassium: 4 mmol/L (ref 3.5–5.3)
SODIUM: 143 mmol/L (ref 135–146)
TOTAL PROTEIN: 5.4 g/dL — AB (ref 6.1–8.1)
Total Bilirubin: 0.3 mg/dL (ref 0.2–1.2)

## 2015-08-11 LAB — CBC
HCT: 41.9 % (ref 36.0–46.0)
Hemoglobin: 13.5 g/dL (ref 12.0–15.0)
MCH: 28.1 pg (ref 26.0–34.0)
MCHC: 32.2 g/dL (ref 30.0–36.0)
MCV: 87.1 fL (ref 78.0–100.0)
MPV: 10.9 fL (ref 8.6–12.4)
PLATELETS: 228 10*3/uL (ref 150–400)
RBC: 4.81 MIL/uL (ref 3.87–5.11)
RDW: 14.2 % (ref 11.5–15.5)
WBC: 9.1 10*3/uL (ref 4.0–10.5)

## 2015-08-11 MED ORDER — TRIAMCINOLONE ACETONIDE 0.1 % EX CREA: 1.0000 "application " | TOPICAL_CREAM | Freq: Two times a day (BID) | CUTANEOUS | Status: DC

## 2015-08-11 MED ORDER — CLONIDINE HCL 0.1 MG PO TABS: 0.1000 mg | ORAL_TABLET | Freq: Every day | ORAL | Status: DC

## 2015-08-11 NOTE — Progress Notes (Signed)
Subjective:   HPI  Holly [name] is a 59 y.o. female who presents for a complete physical.   Concerns: compliant with cholesterol medication and OTC Vit D daily.  Smoking still.  Has cut back some.  Plans to start smoking cessation class through work in January.  Menopause - uses Clonidine QHS for hot flashes and sleep.  Works ok.    Had allergic reaction to deodorant few weeks ago.   Used the medication then from urgent care, but rash hasn't completely resolved  Has scar on right shin from injury back in summer.  Reviewed their medical, surgical, family, social, medication, and allergy history and updated chart as appropriate.  Past Medical History  Diagnosis Date  . Hyperlipidemia   . Wears glasses   . Tobacco use disorder   . Herpetic lesion     hx/o herpetic lesion right hand and left ear? gets once to twice yearly  . Menopausal disorder   . Vitamin D deficiency   . History of mammogram 01/2014    normal  . Sleep difficulties     due to menopause  . Chronic constipation     enrolled in a constipation study with [facility] 01/2013, on Plecanatide 6mg  daily  . Microscopic hematuria 04/2013    urology eval, no worrisome causes.  recheck only with frank hematuria.  . S/P hysterectomy     due to fibroids  . Diverticulosis 01/2013    on colonoscopy  . Normal cardiac stress test 03/23/14    Dr. Tollie Eth  . H/O echocardiogram 03/2014    normal, Dr. Tollie Eth  . SOB (shortness of breath)     cardiac and PFT eval 03/2014    Past Surgical History  Procedure Laterality Date  . Partial hysterectomy  1989    still has 1 ovary; hx/o uterine fibroids  . Ectopic pregnancy surgery  1990  . Colonoscopy  01/2013    external hemorrohids, severe melanosis, diverticulosis; Dr. Deatra Ina.  prior colonoscopy Dr. Collene Mares.    Social History   Social History  . Marital Status: Married    Spouse Name: N/A  . Number of Children: N/A  . Years of Education: N/A   Occupational History   . bus driver [place] History Main Topics  . Smoking status: Current Every Day Smoker -- 0.30 packs/day for 16 years    Types: Cigarettes  . Smokeless tobacco: Never Used  . Alcohol Use: No  . Drug Use: No  . Sexual Activity: Not on file     Comment: married, 2 chlidren, school bus driver, exercise with walking   Other Topics Concern  . Not on file   Social History Narrative   Bus driver, works at [employer], exercises with walking, married, has 2 children, ages 73yo and 57yo, both live in [place], 4 grandchildren    Family History  Problem Relation Age of Onset  . Hyperlipidemia Mother   . Hypertension Mother   . Diabetes Mother   . Hyperlipidemia Father   . Hypertension Father   . Diabetes Father   . Stroke Brother     during surgery  . Cancer Neg Hx   . Heart disease Neg Hx      Current outpatient prescriptions:  .  calcium-vitamin D (OSCAL-500) 500-400 MG-UNIT per tablet, Take 1 tablet by mouth 2 (two) times daily., Disp: 180 tablet, Rfl: 3 .  naproxen (NAPROSYN) 500 MG tablet, Take 1 tablet (500 mg total) by mouth 2 (  two) times daily., Disp: 60 tablet, Rfl: 0 .  pravastatin (PRAVACHOL) 40 MG tablet, Take 1 tablet (40 mg total) by mouth daily., Disp: 90 tablet, Rfl: 0 .  cephALEXin (KEFLEX) 500 MG capsule, Take 1 capsule (500 mg total) by mouth 4 (four) times daily. (Patient not taking: Reported on 08/11/2015), Disp: 28 capsule, Rfl: 0 .  cloNIDine (CATAPRES) 0.1 MG tablet, Take 1 tablet (0.1 mg total) by mouth daily., Disp: 90 tablet, Rfl: 3 .  HYDROcodone-homatropine (HYCODAN) 5-1.5 MG/5ML syrup, Take 5 mLs by mouth at bedtime as needed for cough. (Patient not taking: Reported on 08/11/2015), Disp: 120 mL, Rfl: 0 .  triamcinolone cream (KENALOG) 0.1 %, Apply 1 application topically 2 (two) times daily., Disp: 45 g, Rfl: 0 .  valACYclovir (VALTREX) 1000 MG tablet, 2 tablet po BID x 1 day for flare up (Patient not taking: Reported on  08/11/2015), Disp: 30 tablet, Rfl: 2  Allergies  Allergen Reactions  . Penicillins Rash     Review of Systems Constitutional: -fever, -chills, -sweats, -unexpected weight change, -decreased appetite, -fatigue Allergy: -sneezing, -itching, -congestion Dermatology: -changing moles, +rash, -lumps ENT: -runny nose, -ear pain, -sore throat, -hoarseness, -sinus pain, -teeth pain, - ringing in ears, -hearing loss, -nosebleeds Cardiology: -chest pain, -palpitations, -swelling, -difficulty breathing when lying flat, -waking up short of breath Respiratory: -cough, -shortness of breath, -difficulty breathing with exercise or exertion, -wheezing, -coughing up blood Gastroenterology: -abdominal pain, -nausea, -vomiting, -diarrhea, -constipation, -blood in stool, -changes in bowel movement, -difficulty swallowing or eating Hematology: -bleeding, -bruising  Musculoskeletal: -joint aches, -muscle aches, -joint swelling, -back pain, -neck pain, -cramping, -changes in gait Ophthalmology: denies vision changes, eye redness, itching, discharge Urology: -burning with urination, -difficulty urinating, -blood in urine, -urinary frequency, -urgency, -incontinence Neurology: -headache, -weakness, -tingling, -numbness, -memory loss, -falls, -dizziness Psychology: -depressed mood, -agitation, -sleep problems     Objective:   Physical Exam  BP 122/88 mmHg  Pulse 84  Ht 5' 3.75" (1.619 m)  Wt 154 lb (69.854 kg)  BMI 26.65 kg/m2  General appearance: alert, no distress, WD/WN, pleasant AA female Skin: brown flat patches approx 3cm x 4 cm in bilat axillar, scattered flat brown patches on left neck from prior contact dermatitis.   Right anterior proximal 1/3 of shin with 3cm linear abrasion scar, few scattered benign appearing moles, right ear pinna just outside canal posteriorly with few small round scars from prior herpetic lesions HEENT: normocephalic, conjunctiva/corneas normal, sclerae anicteric, PERRLA,  EOMi, nares patent, no discharge or erythema, pharynx normal Oral cavity: MMM, tongue normal, teeth in good repair Neck: supple, no lymphadenopathy, no thyromegaly, no masses, normal ROM,no bruits Chest: non tender, normal shape and expansion Heart: RRR, normal S1, S2, no murmurs Lungs: CTA bilaterally, no wheezes, rhonchi, or rales Abdomen: +bs, soft, non tender, non distended, no masses, no hepatomegaly, no splenomegaly, no bruits Back: non tender, normal ROM, no scoliosis Musculoskeletal: upper extremities non tender, no obvious deformity, normal ROM throughout, lower extremities non tender, no obvious deformity, normal ROM throughout Extremities: no edema, no cyanosis, no clubbing Pulses: 2+ symmetric, upper and lower extremities, normal cap refill Neurological: alert, oriented x 3, CN2-12 intact, strength normal upper extremities and lower extremities, sensation normal throughout, DTRs 2+ throughout, no cerebellar signs, gait normal Psychiatric: normal affect, behavior normal, pleasant  Breast: nontender, no masses or lumps, no skin changes, no nipple discharge or inversion, no axillary lymphadenopathy Gyn: Normal external genitalia without lesions, vagina with normal mucosa, no abnormal vaginal discharge.  Adnexa not enlarged,  nontender, no masses.  S/p hysterectomy.  Exam chaperoned by nurse. Rectal: deferred    Assessment and Plan :    Encounter Diagnoses  Name Primary?  . Encounter for health maintenance examination in adult Yes  . Hyperlipidemia   . Vitamin D deficiency   . Tobacco use disorder   . Menopausal state   . Contact dermatitis     Physical exam - discussed healthy lifestyle, diet, exercise, preventative care, vaccinations, and addressed their concerns.  Handout given. See your eye doctor yearly for routine vision care. See your dentist yearly for routine dental care including hygiene visits twice yearly. Hyperlipidemia - compliant with medication, labs  today Smoker - encouraged to stop tobacco given the risks Vit D deficiency - labs today, compliant with medication OTC Menopausal state - taking Clonidine, doing fine Declines flu shot Discussed bone density screening.  Risks factors include smoker, vit D deficiency, menopause, and had fall at work, but tripped.  She will check insurance coverage and let us know Contact dermatitis - begin triamcinolone cream x 1-2 wk.  Call back if not resolving. Use cocoa butter for scar on right shin Follow-up pending labs

## 2015-08-12 LAB — VITAMIN D 25 HYDROXY (VIT D DEFICIENCY, FRACTURES): VIT D 25 HYDROXY: 17 ng/mL — AB (ref 30–100)

## 2015-08-15 ENCOUNTER — Other Ambulatory Visit: Payer: Self-pay | Admitting: Medical

## 2015-08-15 MED ORDER — PRAVASTATIN SODIUM 40 MG PO TABS: 40.0000 mg | ORAL_TABLET | Freq: Every day | ORAL | Status: DC

## 2015-08-15 MED ORDER — VITAMIN D3 50 MCG (2000 UT) PO CAPS: 2000.0000 [IU] | ORAL_CAPSULE | Freq: Every day | ORAL | Status: DC

## 2015-12-09 ENCOUNTER — Ambulatory Visit (HOSPITAL_COMMUNITY)
Admission: EM | Admit: 2015-12-09 | Discharge: 2015-12-09 | Disposition: A | Discharge status: Home / Self Care | Payer: BC Managed Care – PPO | Attending: Family Medicine | Admitting: Family Medicine

## 2015-12-09 ENCOUNTER — Encounter (HOSPITAL_COMMUNITY): Payer: Self-pay | Admitting: *Deleted

## 2015-12-09 DIAGNOSIS — R0789 Other chest pain: Secondary | ICD-10-CM

## 2015-12-09 DIAGNOSIS — J40 Bronchitis, not specified as acute or chronic: Secondary | ICD-10-CM

## 2015-12-09 DIAGNOSIS — R071 Chest pain on breathing: Secondary | ICD-10-CM

## 2015-12-09 DIAGNOSIS — J301 Allergic rhinitis due to pollen: Secondary | ICD-10-CM

## 2015-12-09 DIAGNOSIS — Z720 Tobacco use: Secondary | ICD-10-CM

## 2015-12-09 DIAGNOSIS — J410 Simple chronic bronchitis: Secondary | ICD-10-CM

## 2015-12-09 HISTORY — DX: Essential (primary) hypertension: I10

## 2015-12-09 MED ORDER — PREDNISONE 50 MG PO TABS: ORAL_TABLET | ORAL | Status: DC

## 2015-12-09 MED ORDER — AZITHROMYCIN 250 MG PO TABS: ORAL_TABLET | ORAL | Status: DC

## 2015-12-09 MED ORDER — FLUTICASONE PROPIONATE 50 MCG/ACT NA SUSP: 1.0000 | Freq: Two times a day (BID) | NASAL | Status: DC

## 2015-12-09 MED ORDER — GUAIFENESIN-CODEINE 100-10 MG/5ML PO SYRP: 10.0000 mL | ORAL_SOLUTION | Freq: Four times a day (QID) | ORAL | Status: DC | PRN

## 2015-12-09 NOTE — ED Provider Notes (Signed)
CSN: [id number]     Arrival date & time 12/09/15  1450 History   First MD Initiated Contact with Patient 12/09/15 1558     Chief Complaint  Patient presents with  . Cough   (Consider location/radiation/quality/duration/timing/severity/associated sxs/prior Treatment) Patient is a 60 y.o. female presenting with cough. The history is provided by the patient.  Cough Cough characteristics:  Non-productive and dry Severity:  Moderate Onset quality:  Gradual Duration:  4 days Progression:  Unchanged Chronicity:  New Smoker: yes   Context: smoke exposure and weather changes   Relieved by:  None tried Worsened by:  Nothing tried Associated symptoms: chest pain, rhinorrhea and sinus congestion   Associated symptoms: no chills, no fever and no wheezing     Past Medical History  Diagnosis Date  . Hyperlipidemia   . Wears glasses   . Tobacco use disorder   . Herpetic lesion     hx/o herpetic lesion right hand and left ear? gets once to twice yearly  . Menopausal disorder   . Vitamin D deficiency   . History of mammogram 01/2014    normal  . Sleep difficulties     due to menopause  . Chronic constipation     enrolled in a constipation study with [place] 01/2013, on Plecanatide 6mg  daily  . Microscopic hematuria 04/2013    urology eval, no worrisome causes.  recheck only with frank hematuria.  . S/P hysterectomy     due to fibroids  . Diverticulosis 01/2013    on colonoscopy  . Normal cardiac stress test 03/23/14    Dr. Tollie Eth  . H/O echocardiogram 03/2014    normal, Dr. Tollie Eth  . SOB (shortness of breath)     cardiac and PFT eval 03/2014  . Hypertension    Past Surgical History  Procedure Laterality Date  . Partial hysterectomy  1989    still has 1 ovary; hx/o uterine fibroids  . Ectopic pregnancy surgery  1990  . Colonoscopy  01/2013    external hemorrohids, severe melanosis, diverticulosis; Dr. Deatra Ina.  prior colonoscopy Dr. Collene Mares.   Family History  Problem  Relation Age of Onset  . Hyperlipidemia Mother   . Hypertension Mother   . Diabetes Mother   . Hyperlipidemia Father   . Hypertension Father   . Diabetes Father   . Stroke Brother     during surgery  . Cancer Neg Hx   . Heart disease Neg Hx    Social History  Substance Use Topics  . Smoking status: Current Every Day Smoker -- 0.30 packs/day for 16 years    Types: Cigarettes  . Smokeless tobacco: Never Used  . Alcohol Use: No   OB History    No data available     Review of Systems  Constitutional: Negative for fever and chills.  HENT: Positive for congestion, postnasal drip, rhinorrhea and sneezing.   Respiratory: Positive for cough. Negative for wheezing.   Cardiovascular: Positive for chest pain.  Gastrointestinal: Negative.   All other systems reviewed and are negative.   Allergies  Penicillins  Home Medications   Prior to Admission medications   Medication Sig Start Date End Date Taking? Authorizing Provider  Cholecalciferol (VITAMIN D3) 2000 UNITS capsule Take 1 capsule (2,000 Units total) by mouth daily. 08/15/15  Yes Camelia Eng Tysinger, PA-C  cloNIDine (CATAPRES) 0.1 MG tablet Take 1 tablet (0.1 mg total) by mouth daily. 08/11/15  Yes Camelia Eng Tysinger, PA-C  pravastatin (PRAVACHOL) 40 MG tablet Take 1  tablet (40 mg total) by mouth daily. 08/15/15  Yes Camelia Eng Tysinger, PA-C  azithromycin (ZITHROMAX Z-PAK) 250 MG tablet Take as directed on pack 12/09/15   Billy Fischer, MD  calcium-vitamin D (OSCAL-500) 500-400 MG-UNIT per tablet Take 1 tablet by mouth 2 (two) times daily. 06/30/13   Camelia Eng Tysinger, PA-C  cephALEXin (KEFLEX) 500 MG capsule Take 1 capsule (500 mg total) by mouth 4 (four) times daily. Patient not taking: Reported on 08/11/2015 04/13/15   Delsa Grana, PA-C  fluticasone (FLONASE) 50 MCG/ACT nasal spray Place 1 spray into both nostrils 2 (two) times daily. 12/09/15   Billy Fischer, MD  guaiFENesin-codeine [facility]) 100-10 MG/5ML syrup Take 10 mLs by  mouth 4 (four) times daily as needed for cough. 12/09/15   Billy Fischer, MD  HYDROcodone-homatropine [facility]) 5-1.5 MG/5ML syrup Take 5 mLs by mouth at bedtime as needed for cough. Patient not taking: Reported on 08/11/2015 04/18/15   Girtha Rm, NP  naproxen (NAPROSYN) 500 MG tablet Take 1 tablet (500 mg total) by mouth 2 (two) times daily. 08/08/14   Camelia Eng Tysinger, PA-C  predniSONE (DELTASONE) 50 MG tablet 1 tab daily for 2 days then 1/2 tab daily for 2 days. 12/09/15   Billy Fischer, MD  triamcinolone cream (KENALOG) 0.1 % Apply 1 application topically 2 (two) times daily. 08/11/15   Camelia Eng Tysinger, PA-C  valACYclovir (VALTREX) 1000 MG tablet 2 tablet po BID x 1 day for flare up Patient not taking: Reported on 08/11/2015 08/08/14   Camelia Eng Tysinger, PA-C   Meds Ordered and Administered this Visit  Medications - No data to display  BP 149/91 mmHg  Pulse 87  Temp(Src) 98.1 F (36.7 C) (Oral)  Resp 18  SpO2 96% No data found.   Physical Exam  Constitutional: She is oriented to person, place, and time. She appears well-developed and well-nourished. No distress.  HENT:  Right Ear: External ear normal.  Left Ear: External ear normal.  Mouth/Throat: Oropharynx is clear and moist.  Eyes: Pupils are equal, round, and reactive to light.  Neck: Normal range of motion. Neck supple.  Cardiovascular: Normal rate, normal heart sounds and intact distal pulses.   Pulmonary/Chest: Effort normal and breath sounds normal. She exhibits tenderness.  Neurological: She is alert and oriented to person, place, and time.  Skin: Skin is warm and dry.  Nursing note and vitals reviewed.   ED Course  Procedures (including critical care time)  Labs Review Labs Reviewed - No data to display  Imaging Review No results found.   Visual Acuity Review  Right Eye Distance:   Left Eye Distance:   Bilateral Distance:    Right Eye Near:   Left Eye Near:    Bilateral Near:         MDM    1. Anterior chest wall pain   2. Bronchitis due to tobacco use ([place])   3. Seasonal allergic rhinitis due to pollen    Meds ordered this encounter  Medications  . azithromycin (ZITHROMAX Z-PAK) 250 MG tablet    Sig: Take as directed on pack    Dispense:  6 tablet    Refill:  0  . guaiFENesin-codeine (ROBITUSSIN AC) 100-10 MG/5ML syrup    Sig: Take 10 mLs by mouth 4 (four) times daily as needed for cough.    Dispense:  180 mL    Refill:  0  . fluticasone (FLONASE) 50 MCG/ACT nasal spray    Sig:  Place 1 spray into both nostrils 2 (two) times daily.    Dispense:  1 g    Refill:  2  . predniSONE (DELTASONE) 50 MG tablet    Sig: 1 tab daily for 2 days then 1/2 tab daily for 2 days.    Dispense:  3 tablet    Refill:  0       Billy Fischer, MD 12/09/15 [phone]

## 2015-12-09 NOTE — ED Notes (Signed)
Allergy-type sxs x 4 days; last night started with cough, and rib/chest pain with coughing.  Has been taking allergy med and Mucinex.

## 2016-02-05 ENCOUNTER — Encounter: Payer: Self-pay | Admitting: Medical

## 2016-02-05 LAB — HM MAMMOGRAPHY

## 2016-02-07 LAB — HM MAMMOGRAPHY

## 2016-02-12 ENCOUNTER — Telehealth: Payer: Self-pay | Admitting: Medical

## 2016-02-12 NOTE — Telephone Encounter (Signed)
The recent mammogram showed no worrisome finding.  C/t yearly mammogram.

## 2016-02-13 ENCOUNTER — Encounter: Payer: Self-pay | Admitting: Medical

## 2016-02-13 NOTE — Telephone Encounter (Signed)
LMTCB

## 2016-02-13 NOTE — Telephone Encounter (Signed)
Pt aware. /RLB  

## 2016-04-07 ENCOUNTER — Other Ambulatory Visit: Payer: Self-pay | Admitting: Medical

## 2016-08-11 ENCOUNTER — Other Ambulatory Visit: Payer: Self-pay | Admitting: Medical

## 2016-08-13 NOTE — Telephone Encounter (Signed)
Can this patient have a refill on this ? 

## 2016-08-15 ENCOUNTER — Other Ambulatory Visit: Payer: Self-pay | Admitting: Medical

## 2016-08-15 ENCOUNTER — Ambulatory Visit (INDEPENDENT_AMBULATORY_CARE_PROVIDER_SITE_OTHER): Payer: BC Managed Care – PPO | Admitting: Medical

## 2016-08-15 ENCOUNTER — Encounter: Payer: Self-pay | Admitting: Medical

## 2016-08-15 VITALS — BP 126/68 | HR 77 | Ht 62.0 in | Wt 162.8 lb

## 2016-08-15 DIAGNOSIS — Z23 Encounter for immunization: Secondary | ICD-10-CM

## 2016-08-15 DIAGNOSIS — Z87891 Personal history of nicotine dependence: Secondary | ICD-10-CM | POA: Diagnosis not present

## 2016-08-15 DIAGNOSIS — E785 Hyperlipidemia, unspecified: Secondary | ICD-10-CM | POA: Diagnosis not present

## 2016-08-15 DIAGNOSIS — Z0000 Encounter for general adult medical examination without abnormal findings: Secondary | ICD-10-CM

## 2016-08-15 DIAGNOSIS — Z13820 Encounter for screening for osteoporosis: Secondary | ICD-10-CM | POA: Diagnosis not present

## 2016-08-15 DIAGNOSIS — N951 Menopausal and female climacteric states: Secondary | ICD-10-CM

## 2016-08-15 DIAGNOSIS — E2839 Other primary ovarian failure: Secondary | ICD-10-CM

## 2016-08-15 DIAGNOSIS — E559 Vitamin D deficiency, unspecified: Secondary | ICD-10-CM | POA: Diagnosis not present

## 2016-08-15 DIAGNOSIS — N644 Mastodynia: Secondary | ICD-10-CM | POA: Diagnosis not present

## 2016-08-15 LAB — CBC
HEMATOCRIT: 41.1 % (ref 35.0–45.0)
Hemoglobin: 13.4 g/dL (ref 11.7–15.5)
MCH: 28.5 pg (ref 27.0–33.0)
MCHC: 32.6 g/dL (ref 32.0–36.0)
MCV: 87.3 fL (ref 80.0–100.0)
MPV: 11.3 fL (ref 7.5–12.5)
PLATELETS: 230 10*3/uL (ref 140–400)
RBC: 4.71 MIL/uL (ref 3.80–5.10)
RDW: 14.3 % (ref 11.0–15.0)
WBC: 7.5 10*3/uL (ref 4.0–10.5)

## 2016-08-15 LAB — POCT URINALYSIS DIPSTICK
BILIRUBIN UA: NEGATIVE
GLUCOSE UA: NEGATIVE
KETONES UA: NEGATIVE
Leukocytes, UA: NEGATIVE
NITRITE UA: NEGATIVE
PH UA: 6
Protein, UA: NEGATIVE
Urobilinogen, UA: NEGATIVE

## 2016-08-15 LAB — COMPREHENSIVE METABOLIC PANEL
ALBUMIN: 4.1 g/dL (ref 3.6–5.1)
ALT: 7 U/L (ref 6–29)
AST: 14 U/L (ref 10–35)
Alkaline Phosphatase: 119 U/L (ref 33–130)
BILIRUBIN TOTAL: 0.4 mg/dL (ref 0.2–1.2)
BUN: 11 mg/dL (ref 7–25)
CHLORIDE: 107 mmol/L (ref 98–110)
CO2: 29 mmol/L (ref 20–31)
CREATININE: 0.53 mg/dL (ref 0.50–1.05)
Calcium: 9.2 mg/dL (ref 8.6–10.4)
Glucose, Bld: 85 mg/dL (ref 65–99)
Potassium: 4.3 mmol/L (ref 3.5–5.3)
SODIUM: 142 mmol/L (ref 135–146)
TOTAL PROTEIN: 7 g/dL (ref 6.1–8.1)

## 2016-08-15 LAB — LIPID PANEL
Cholesterol: 204 mg/dL — ABNORMAL HIGH (ref <200)
HDL: 48 mg/dL — AB (ref >50)
LDL CALC: 141 mg/dL — AB (ref <100)
Total CHOL/HDL Ratio: 4.3 Ratio (ref <5.0)
Triglycerides: 77 mg/dL (ref <150)
VLDL: 15 mg/dL (ref <30)

## 2016-08-15 LAB — TSH: TSH: 4.07 m[IU]/L

## 2016-08-15 NOTE — Progress Notes (Signed)
Subjective:   HPI  Holly [name] is a 60 y.o. female who presents for a complete physical.   Concerns: Overall doing fine herself, but been dealing with husbands therapy for metastatic renal cell carcinoma diagnosed this year.   Sometimes feels a little constipated, worse with cheese.  Compliant with cholesterol medication and OTC Vit D daily.  Quit smoking a month ago  Menopause - uses Clonidine QHS for hot flashes and sleep.  Works ok.    Reviewed their medical, surgical, family, social, medication, and allergy history and updated chart as appropriate.  Past Medical History:  Diagnosis Date  . Chronic constipation    enrolled in a constipation study with [place] 01/2013, on Plecanatide 6mg  daily  . Diverticulosis 01/2013   on colonoscopy  . H/O echocardiogram 03/2014   normal, Dr. Tollie Eth  . 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 gets once to twice yearly  . History of mammogram 01/2014   normal  . Hyperlipidemia   . Hypertension   . Menopausal disorder   . Microscopic hematuria 04/2013   urology eval, no worrisome causes.  recheck only with frank hematuria.  . Normal cardiac stress test 03/23/14   Dr. Tollie Eth  . S/P hysterectomy    due to fibroids  . Sleep difficulties    due to menopause  . SOB (shortness of breath)    cardiac and PFT eval 03/2014  . Tobacco use disorder   . Vitamin D deficiency   . Wears glasses     Past Surgical History:  Procedure Laterality Date  . COLONOSCOPY  01/2013   external hemorrohids, severe melanosis, diverticulosis; Dr. Deatra Ina.  prior colonoscopy Dr. Collene Mares.  . ECTOPIC PREGNANCY SURGERY  1990  . PARTIAL HYSTERECTOMY  1989   still has 1 ovary; hx/o uterine fibroids    Social History   Social History  . Marital status: Married    Spouse name: N/A  . Number of children: N/A  . Years of education: N/A   Occupational History  . 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 Smoking status: Former Smoker    Packs/day: 0.30    Years: 17.00    Types: Cigarettes    Quit date: 07/16/2016  . Smokeless tobacco: Never Used  . Alcohol use No  . Drug use: No  . Sexual activity: Not on file     Comment: married, 2 chlidren, school bus driver, exercise with walking   Other Topics Concern  . Not on file   Social History Narrative   Bus driver, works at [employer], exercises with walking, married, has 2 children, ages 52yo and 26yo, both live in [place], 4 grandchildren.   Husband with metastatic cancer 2017, renal cell.   As of 07/2016    Family History  Problem Relation Age of Onset  . Hyperlipidemia Mother   . Hypertension Mother   . Diabetes Mother   . Hyperlipidemia Father   . Hypertension Father   . Diabetes Father   . Stroke Brother     during surgery  . Cancer Neg Hx   . Heart disease Neg Hx      Current Outpatient Prescriptions:  .  calcium-vitamin D (OSCAL-500) 500-400 MG-UNIT per tablet, Take 1 tablet by mouth 2 (two) times daily., Disp: 180 tablet, Rfl: 3 .  Cholecalciferol (VITAMIN D3) 2000 UNITS capsule, Take 1 capsule (2,000 Units total) by mouth daily., Disp: 90 capsule,  Rfl: 3 .  cloNIDine (CATAPRES) 0.1 MG tablet, take 1 tablet by mouth once daily, Disp: 90 tablet, Rfl: 3 .  pravastatin (PRAVACHOL) 40 MG tablet, Take 1 tablet (40 mg total) by mouth daily., Disp: 90 tablet, Rfl: 0 .  valACYclovir (VALTREX) 1000 MG tablet, take 2 tablets by mouth twice a day for ONE DAY FOR FLARE UP, Disp: 30 tablet, Rfl: 0 .  azithromycin (ZITHROMAX Z-PAK) 250 MG tablet, Take as directed on pack (Patient not taking: Reported on 08/15/2016), Disp: 6 tablet, Rfl: 0 .  cephALEXin (KEFLEX) 500 MG capsule, Take 1 capsule (500 mg total) by mouth 4 (four) times daily. (Patient not taking: Reported on 08/15/2016), Disp: 28 capsule, Rfl: 0 .  fluticasone (FLONASE) 50 MCG/ACT nasal spray, Place 1 spray into both nostrils 2 (two) times daily. (Patient  not taking: Reported on 08/15/2016), Disp: 1 g, Rfl: 2 .  guaiFENesin-codeine (ROBITUSSIN AC) 100-10 MG/5ML syrup, Take 10 mLs by mouth 4 (four) times daily as needed for cough. (Patient not taking: Reported on 08/15/2016), Disp: 180 mL, Rfl: 0 .  HYDROcodone-homatropine (HYCODAN) 5-1.5 MG/5ML syrup, Take 5 mLs by mouth at bedtime as needed for cough. (Patient not taking: Reported on 08/15/2016), Disp: 120 mL, Rfl: 0 .  naproxen (NAPROSYN) 500 MG tablet, Take 1 tablet (500 mg total) by mouth 2 (two) times daily. (Patient not taking: Reported on 08/15/2016), Disp: 60 tablet, Rfl: 0 .  predniSONE (DELTASONE) 50 MG tablet, 1 tab daily for 2 days then 1/2 tab daily for 2 days. (Patient not taking: Reported on 08/15/2016), Disp: 3 tablet, Rfl: 0 .  triamcinolone cream (KENALOG) 0.1 %, Apply 1 application topically 2 (two) times daily. (Patient not taking: Reported on 08/15/2016), Disp: 45 g, Rfl: 0  Allergies  Allergen Reactions  . Penicillins Rash     Review of Systems Constitutional: -fever, -chills, -sweats, -unexpected weight change, -decreased appetite, -fatigue Allergy: -sneezing, -itching, -congestion Dermatology: -changing moles, +rash, -lumps ENT: -runny nose, -ear pain, -sore throat, -hoarseness, -sinus pain, -teeth pain, - ringing in ears, -hearing loss, -nosebleeds Cardiology: -chest pain, -palpitations, -swelling, -difficulty breathing when lying flat, -waking up short of breath Respiratory: -cough, -shortness of breath, -difficulty breathing with exercise or exertion, -wheezing, -coughing up blood Gastroenterology: -abdominal pain, -nausea, -vomiting, -diarrhea, -constipation, -blood in stool, -changes in bowel movement, -difficulty swallowing or eating Hematology: -bleeding, -bruising  Musculoskeletal: -joint aches, -muscle aches, -joint swelling, -back pain, -neck pain, -cramping, -changes in gait Ophthalmology: denies vision changes, eye redness, itching, discharge Urology:  -burning with urination, -difficulty urinating, -blood in urine, -urinary frequency, -urgency, -incontinence Neurology: -headache, -weakness, -tingling, -numbness, -memory loss, -falls, -dizziness Psychology: -depressed mood, -agitation, -sleep problems     Objective:   Physical Exam  BP 126/68   Pulse 77   Ht 5\' 2"  (1.575 m)   Wt 162 lb 12.8 oz (73.8 kg)   SpO2 99%   BMI 29.78 kg/m   General appearance: alert, no distress, WD/WN, pleasant AA female Skin: brown flat patches approx 3cm x 4 cm in bilat axilla, scattered flat brown patches on left neck from prior contact dermatitis.   Right anterior proximal 1/3 of shin with 3cm linear abrasion scar, few scattered benign appearing moles, right ear pinna just outside canal posteriorly with few small round scars from prior herpetic lesions HEENT: normocephalic, conjunctiva/corneas normal, sclerae anicteric, PERRLA, EOMi, nares patent, no discharge or erythema, pharynx normal Oral cavity: MMM, tongue normal, teeth in good repair Neck: supple, no lymphadenopathy, no thyromegaly, no masses,  normal ROM,no bruits Chest: non tender, normal shape and expansion Heart: RRR, normal S1, S2, no murmurs Lungs: CTA bilaterally, no wheezes, rhonchi, or rales Abdomen: +bs, soft, non tender, non distended, no masses, no hepatomegaly, no splenomegaly, no bruits Back: non tender, normal ROM, no scoliosis Musculoskeletal: upper extremities non tender, no obvious deformity, normal ROM throughout, lower extremities non tender, no obvious deformity, normal ROM throughout Extremities: no edema, no cyanosis, no clubbing Pulses: 2+ symmetric, upper and lower extremities, normal cap refill Neurological: alert, oriented x 3, CN2-12 intact, strength normal upper extremities and lower extremities, sensation normal throughout, DTRs 2+ throughout, no cerebellar signs, gait normal Psychiatric: normal affect, behavior normal, pleasant  Breast: nontender, no masses or  lumps, no skin changes, no nipple discharge or inversion, no axillary lymphadenopathy Gyn: Normal external genitalia without lesions, vagina with normal mucosa, no abnormal vaginal discharge.  Adnexa not enlarged, nontender, no masses.  S/p hysterectomy.  Exam chaperoned by nurse. Rectal: deferred    Assessment and Plan :    Encounter Diagnoses  Name Primary?  . Routine general medical examination at a health care facility Yes  . Hyperlipidemia, unspecified hyperlipidemia type   . Menopausal state   . Vitamin D deficiency   . Former smoker   . Estrogen deficiency   . Screening for osteoporosis   . Breast tenderness     Physical exam - discussed healthy lifestyle, diet, exercise, preventative care, vaccinations, and addressed their concerns.   See your eye doctor yearly for routine vision care. See your dentist yearly for routine dental care including hygiene visits twice yearly. Hyperlipidemia - compliant with medication, labs today Glad she stopped tobacco a month ago.  counseled on keeping off tobacco Breast tenderness - reviewed 01/2016 mammogram.   Advised she cut back on caffeine which may be contributing Vit D deficiency - labs today, compliant with medication OTC Menopausal state - taking Clonidine, doing fine Discussed bone density screening.  Risks factors include smoker, vit D deficiency, menopause, and had fall at work, but tripped 2016.   Counseled on the influenza virus vaccine.  Vaccine information sheet given.  Influenza vaccine given after consent obtained. Follow-up pending labs   Morrisa was seen today for physical.  Diagnoses and all orders for this visit:  Routine general medical examination at a health care facility -     Urinalysis Dipstick -     CBC -     Lipid panel -     Comprehensive metabolic panel -     TSH -     Hemoglobin A1c -     DG Bone Density; Future -     VITAMIN D 25 Hydroxy (Vit-D Deficiency, Fractures)  Hyperlipidemia, unspecified  hyperlipidemia type -     Lipid panel -     Comprehensive metabolic panel  Menopausal state -     DG Bone Density; Future  Vitamin D deficiency -     DG Bone Density; Future -     VITAMIN D 25 Hydroxy (Vit-D Deficiency, Fractures)  Former smoker -     DG Bone Density; Future  Estrogen deficiency -     DG Bone Density; Future  Screening for osteoporosis -     DG Bone Density; Future  Breast tenderness

## 2016-08-15 NOTE — Addendum Note (Signed)
Addended by: Tyrone Apple on: 08/15/2016 12:24 PM   Modules accepted: Orders

## 2016-08-15 NOTE — Addendum Note (Signed)
Addended by: Carlena Hurl on: 08/15/2016 09:11 AM   Modules accepted: Orders

## 2016-08-16 ENCOUNTER — Other Ambulatory Visit: Payer: Self-pay | Admitting: Medical

## 2016-08-16 LAB — HEMOGLOBIN A1C
HEMOGLOBIN A1C: 5.6 % (ref <5.7)
Mean Plasma Glucose: 114 mg/dL

## 2016-08-16 LAB — VITAMIN D 25 HYDROXY (VIT D DEFICIENCY, FRACTURES): Vit D, 25-Hydroxy: 35 ng/mL (ref 30–100)

## 2016-08-16 MED ORDER — PRAVASTATIN SODIUM 40 MG PO TABS: 40.0000 mg | ORAL_TABLET | Freq: Every day | ORAL | 3 refills | Status: DC

## 2016-08-16 MED ORDER — FLUTICASONE PROPIONATE 50 MCG/ACT NA SUSP: 1.0000 | Freq: Two times a day (BID) | NASAL | 11 refills | Status: DC

## 2016-08-19 DIAGNOSIS — Z87891 Personal history of nicotine dependence: Secondary | ICD-10-CM

## 2016-08-19 HISTORY — DX: Personal history of nicotine dependence: Z87.891

## 2016-08-20 ENCOUNTER — Ambulatory Visit
Admission: RE | Admit: 2016-08-20 | Discharge: 2016-08-20 | Disposition: A | Discharge status: Home / Self Care | Payer: BC Managed Care – PPO | Source: Ambulatory Visit | Attending: Medical | Admitting: Medical

## 2016-08-20 ENCOUNTER — Other Ambulatory Visit: Payer: Self-pay | Admitting: Medical

## 2016-08-20 DIAGNOSIS — E559 Vitamin D deficiency, unspecified: Secondary | ICD-10-CM

## 2016-08-20 DIAGNOSIS — E2839 Other primary ovarian failure: Secondary | ICD-10-CM

## 2016-08-20 MED ORDER — ALENDRONATE SODIUM 70 MG PO TABS: 70.0000 mg | ORAL_TABLET | ORAL | 3 refills | Status: DC

## 2016-09-10 ENCOUNTER — Ambulatory Visit: Payer: BC Managed Care – PPO | Admitting: Medical

## 2016-10-02 ENCOUNTER — Encounter: Payer: Self-pay | Admitting: Medical

## 2016-10-02 ENCOUNTER — Ambulatory Visit (INDEPENDENT_AMBULATORY_CARE_PROVIDER_SITE_OTHER): Payer: BC Managed Care – PPO | Admitting: Medical

## 2016-10-02 VITALS — BP 126/80 | HR 76 | Wt 165.0 lb

## 2016-10-02 DIAGNOSIS — N951 Menopausal and female climacteric states: Secondary | ICD-10-CM

## 2016-10-02 DIAGNOSIS — Z87891 Personal history of nicotine dependence: Secondary | ICD-10-CM

## 2016-10-02 DIAGNOSIS — R3129 Other microscopic hematuria: Secondary | ICD-10-CM | POA: Diagnosis not present

## 2016-10-02 DIAGNOSIS — M810 Age-related osteoporosis without current pathological fracture: Secondary | ICD-10-CM | POA: Diagnosis not present

## 2016-10-02 DIAGNOSIS — E2839 Other primary ovarian failure: Secondary | ICD-10-CM | POA: Diagnosis not present

## 2016-10-02 DIAGNOSIS — E559 Vitamin D deficiency, unspecified: Secondary | ICD-10-CM

## 2016-10-02 MED ORDER — BUPROPION HCL ER (XL) 150 MG PO TB24: 150.0000 mg | ORAL_TABLET | Freq: Every day | ORAL | 1 refills | Status: DC

## 2016-10-02 NOTE — Patient Instructions (Addendum)
Recommendations  Take Calcium 1200-1500 mg daily  Take Vitamin D 2000u daily  Get weight bearing exercise daily  Continue Fosamax once weekly  Let your dentist know about the Fosamax medication  Plan to repeat Bone Density scan in 2 years  At your convenience return for clean catch morning urine to check for blood   Osteoporosis Osteoporosis is the thinning and loss of density in the bones. Osteoporosis makes the bones more brittle, fragile, and likely to break (fracture). Over time, osteoporosis can cause the bones to become so weak that they fracture after a simple fall. The bones most likely to fracture are the bones in the hip, wrist, and spine. What are the causes? The exact cause is not known. What increases the risk? Anyone can develop osteoporosis. You may be at greater risk if you have a family history of the condition or have poor nutrition. You may also have a higher risk if you are:  Female.  37 years old or older.  A smoker.  Not physically active.  White or Asian.  Slender. What are the signs or symptoms? A fracture might be the first sign of the disease, especially if it results from a fall or injury that would not usually cause a bone to break. Other signs and symptoms include:  Low back and neck pain.  Stooped posture.  Height loss. How is this diagnosed? To make a diagnosis, your health care provider may:  Take a medical history.  Perform a physical exam.  Order tests, such as:  A bone mineral density test.  A dual-energy X-ray absorptiometry test. How is this treated? The goal of osteoporosis treatment is to strengthen your bones to reduce your risk of a fracture. Treatment may involve:  Making lifestyle changes, such as:  Eating a diet rich in calcium.  Doing weight-bearing and muscle-strengthening exercises.  Stopping tobacco use.  Limiting alcohol intake.  Taking medicine to slow the process of bone loss or to increase bone  density.  Monitoring your levels of calcium and vitamin D. Follow these instructions at home:  Include calcium and vitamin D in your diet. Calcium is important for bone health, and vitamin D helps the body absorb calcium.  Perform weight-bearing and muscle-strengthening exercises as directed by your health care provider.  Do not use any tobacco products, including cigarettes, chewing tobacco, and electronic cigarettes. If you need help quitting, ask your health care provider.  Limit your alcohol intake.  Take medicines only as directed by your health care provider.  Keep all follow-up visits as directed by your health care provider. This is important.  Take precautions at home to lower your risk of falling, such as:  Keeping rooms well lit and clutter free.  Installing safety rails on stairs.  Using rubber mats in the bathroom and other areas that are often wet or slippery. Get help right away if: You fall or injure yourself. This information is not intended to replace advice given to you by your health care provider. Make sure you discuss any questions you have with your health care provider. Document Released: 05/15/2005 Document Revised: 01/08/2016 Document Reviewed: 01/13/2014 Elsevier Interactive Patient Education  2017 [place] to your medical provider about using medicines to help you quit. These include nicotine replacement gum, lozenges, or skin patches.  Consider calling 1-800-QUIT-NOW, a toll free 24/7 hotline with free counseling to help you quit.  If you are ready to quit smoking or are thinking about it,  congratulations! You have chosen to help yourself be healthier and live longer! There are lots of different ways to quit smoking. Nicotine gum, nicotine patches, a nicotine inhaler, or nicotine nasal spray can help with physical craving. Hypnosis, support groups, and medicines help break the habit of smoking. TIPS TO GET OFF AND STAY OFF  CIGARETTES  Learn to predict your moods. Do not let a bad situation be your excuse to have a cigarette. Some situations in your life might tempt you to have a cigarette.   Ask friends and co-workers not to smoke around you.   Make your home smoke-free.   Never have "just one" cigarette. It leads to wanting another and another. Remind yourself of your decision to quit.   On a card, make a list of your reasons for not smoking. Read it at least the same number of times a day as you have a cigarette. Tell yourself everyday, "I do not want to smoke. I choose not to smoke."   Ask someone at home or work to help you with your plan to quit smoking.   Have something planned after you eat or have a cup of coffee. Take a walk or get other exercise to perk you up. This will help to keep you from overeating.   Try a relaxation exercise to calm you down and decrease your stress. Remember, you may be tense and nervous the first two weeks after you quit. This will pass.   Find new activities to keep your hands busy. Play with a pen, coin, or rubber band. Doodle or draw things on paper.   Brush your teeth right after eating. This will help cut down the craving for the taste of tobacco after meals. You can try mouthwash too.   Try gum, breath mints, or diet candy to keep something in your mouth.  IF YOU SMOKE AND WANT TO QUIT:  Do not stock up on cigarettes. Never buy a carton. Wait until one pack is finished before you buy another.   Never carry cigarettes with you at work or at home.   Keep cigarettes as far away from you as possible. Leave them with someone else.   Never carry matches or a lighter with you.   Ask yourself, "Do I need this cigarette or is this just a reflex?"   Bet with someone that you can quit. Put cigarette money in a piggy bank every morning. If you smoke, you give up the money. If you do not smoke, by the end of the week, you keep the money.   Keep trying. It takes 21 days  to change a habit!  Document Released: 06/01/2009 Document Revised: 04/17/2011 Document Reviewed: 06/01/2009 [facility] Patient Information 2012 [place].

## 2016-10-02 NOTE — Progress Notes (Signed)
Subjective: Chief Complaint  Patient presents with  . follow up from test results    discuss test results   Here to discussed results from her last physical and the recent bone density scan.  She just quit smoking within the past month, but having hard time resisting urges.    Here to discussed osteoporosis.  She notes getting some exercise, gets very little dairy, is taking some OTC Vit D  She began fosamax from the call back from [place], tolerating this ok.    No other aggravating or relieving factors. No other complaint.  Past Medical History:  Diagnosis Date  . Chronic constipation    enrolled in a constipation study with [facility] 01/2013, on Plecanatide 6mg  daily  . Diverticulosis 01/2013   on colonoscopy  . H/O echocardiogram 03/2014   normal, Dr. Tollie Eth  . 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 gets once to twice yearly  . History of mammogram 01/2014   normal  . Hyperlipidemia   . Hypertension   . Menopausal disorder   . Microscopic hematuria 04/2013   urology eval, no worrisome causes.  recheck only with frank hematuria.  . Normal cardiac stress test 03/23/14   Dr. Tollie Eth  . S/P hysterectomy    due to fibroids  . Sleep difficulties    due to menopause  . SOB (shortness of breath)    cardiac and PFT eval 03/2014  . Tobacco use disorder   . Vitamin D deficiency   . Wears glasses    Current Outpatient Prescriptions on File Prior to Visit  Medication Sig Dispense Refill  . alendronate (FOSAMAX) 70 MG tablet Take 1 tablet (70 mg total) by mouth once a week. Take with a full glass of water on an empty stomach. 12 tablet 3  . calcium-vitamin D (OSCAL-500) 500-400 MG-UNIT per tablet Take 1 tablet by mouth 2 (two) times daily. 180 tablet 3  . Cholecalciferol (VITAMIN D3) 2000 UNITS capsule Take 1 capsule (2,000 Units total) by mouth daily. 90 capsule 3  . cloNIDine (CATAPRES) 0.1 MG tablet take 1 tablet by mouth once daily 90 tablet 3  .  fluticasone (FLONASE) 50 MCG/ACT nasal spray Place 1 spray into both nostrils 2 (two) times daily. 1 g 11  . pravastatin (PRAVACHOL) 40 MG tablet Take 1 tablet (40 mg total) by mouth daily. 90 tablet 3   No current facility-administered medications on file prior to visit.    ROS as in subjective   Objective: BP 126/80   Pulse 76   Wt 165 lb (74.8 kg)   SpO2 97%   BMI 30.18 kg/m   Gen: wd, wn,nad   Assessment: Encounter Diagnoses  Name Primary?  . Osteoporosis, unspecified osteoporosis type, unspecified pathological fracture presence Yes  . Estrogen deficiency   . Microscopic hematuria   . Former smoker   . Menopausal state   . Vitamin D deficiency     Plan: Discussed lab results from her recent physical, her recent bone density results unfortunately showing osteoporosis.  discussed diagnosis of osteoporosis, discussed risk factors, treatment, reducing fall risks.    Recommendations  Take Calcium 1200-1500 mg daily  Take Vitamin D 2000u daily  Get weight bearing exercise daily  Continue Fosamax once weekly  Let your dentist know about the Fosamax medication  Plan to repeat Bone Density scan in 2 years  At your convenience return for clean catch morning urine to check for blood  Discussed her  hx/o chronic microscopic hematuria.  Last urology visit few years ago.  congratulated her on smoking, advised cessation, and advised she consider urology f/u to reassess since she has continued to be a smoker.    Congratulated her on her recent smoking cessation!  Begin trial of Wellbutrin to help with efforts to stay smoke free  Diamon was seen today for follow up from test results.  Diagnoses and all orders for this visit:  Osteoporosis, unspecified osteoporosis type, unspecified pathological fracture presence  Estrogen deficiency  Microscopic hematuria  Former smoker  Menopausal state  Vitamin D deficiency  Other orders -     buPROPion (WELLBUTRIN XL)  150 MG 24 hr tablet; Take 1 tablet (150 mg total) by mouth daily.

## 2016-10-17 ENCOUNTER — Other Ambulatory Visit: Payer: Self-pay | Admitting: Medical

## 2016-11-11 ENCOUNTER — Encounter: Payer: Self-pay | Admitting: Medical

## 2016-11-11 ENCOUNTER — Ambulatory Visit (INDEPENDENT_AMBULATORY_CARE_PROVIDER_SITE_OTHER): Payer: BC Managed Care – PPO | Admitting: Medical

## 2016-11-11 VITALS — BP 128/84 | HR 68 | Wt 164.6 lb

## 2016-11-11 DIAGNOSIS — R252 Cramp and spasm: Secondary | ICD-10-CM | POA: Diagnosis not present

## 2016-11-11 DIAGNOSIS — E559 Vitamin D deficiency, unspecified: Secondary | ICD-10-CM | POA: Diagnosis not present

## 2016-11-11 DIAGNOSIS — M791 Myalgia, unspecified site: Secondary | ICD-10-CM | POA: Insufficient documentation

## 2016-11-11 DIAGNOSIS — N951 Menopausal and female climacteric states: Secondary | ICD-10-CM

## 2016-11-11 DIAGNOSIS — M810 Age-related osteoporosis without current pathological fracture: Secondary | ICD-10-CM

## 2016-11-11 DIAGNOSIS — Z79899 Other long term (current) drug therapy: Secondary | ICD-10-CM | POA: Insufficient documentation

## 2016-11-11 LAB — COMPREHENSIVE METABOLIC PANEL
ALT: 6 U/L (ref 6–29)
AST: 15 U/L (ref 10–35)
Albumin: 4.2 g/dL (ref 3.6–5.1)
Alkaline Phosphatase: 80 U/L (ref 33–130)
BUN: 8 mg/dL (ref 7–25)
CHLORIDE: 107 mmol/L (ref 98–110)
CO2: 23 mmol/L (ref 20–31)
CREATININE: 0.56 mg/dL (ref 0.50–1.05)
Calcium: 9 mg/dL (ref 8.6–10.4)
GLUCOSE: 68 mg/dL (ref 65–99)
Potassium: 4.5 mmol/L (ref 3.5–5.3)
SODIUM: 142 mmol/L (ref 135–146)
Total Bilirubin: 0.4 mg/dL (ref 0.2–1.2)
Total Protein: 7.4 g/dL (ref 6.1–8.1)

## 2016-11-11 NOTE — Progress Notes (Signed)
Subjective:     Patient ID: Holly [name], female   DOB: 11/18/1955, 61 y.o.   MRN: [id number]  HPI Here for symptoms and medication concerns.   Been on statin for years, but lately wondering if this is causing some aches.   In recent weeks having aches in lower legs, hands, cramping.   Wonders about arthritis although no joint swelling or joint deformity.   No paresthesias.  No recent injury, trauma or fall.  She has been less active with exercise since her husband's cancer diagnosis this past year.    This has been their biggest focus.   Since last visit taking Fosamax weekly for osteoporosis and tolerating this fine.  Still taking Ca+Vit D.  Doesn't eat a lot of dairy other than cheese.    Compliant with medication in general.  Still taking Wellbutrin, clonidine QH'S for sleep.    Past Medical History:  Diagnosis Date  . Chronic constipation    enrolled in a constipation study with [facility] 01/2013, on Plecanatide 6mg  daily  . Diverticulosis 01/2013   on colonoscopy  . H/O echocardiogram 03/2014   normal, Dr. Tollie Eth  . 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 gets once to twice yearly  . History of mammogram 01/2014   normal  . Hyperlipidemia   . Hypertension   . Menopausal disorder   . Microscopic hematuria 04/2013   urology eval, no worrisome causes.  recheck only with frank hematuria.  . Normal cardiac stress test 03/23/14   Dr. Tollie Eth  . S/P hysterectomy    due to fibroids  . Sleep difficulties    due to menopause  . SOB (shortness of breath)    cardiac and PFT eval 03/2014  . Tobacco use disorder   . Vitamin D deficiency   . Wears glasses    Current Outpatient Prescriptions on File Prior to Visit  Medication Sig Dispense Refill  . alendronate (FOSAMAX) 70 MG tablet Take 1 tablet (70 mg total) by mouth once a week. Take with a full glass of water on an empty stomach. 12 tablet 3  . buPROPion (WELLBUTRIN XL) 150 MG 24 hr tablet Take 1  tablet (150 mg total) by mouth daily. 30 tablet 1  . calcium-vitamin D (OSCAL-500) 500-400 MG-UNIT per tablet Take 1 tablet by mouth 2 (two) times daily. 180 tablet 3  . cloNIDine (CATAPRES) 0.1 MG tablet take 1 tablet by mouth once daily 90 tablet 3  . fluticasone (FLONASE) 50 MCG/ACT nasal spray Place 1 spray into both nostrils 2 (two) times daily. 1 g 11  . pravastatin (PRAVACHOL) 40 MG tablet Take 1 tablet (40 mg total) by mouth daily. 90 tablet 3  . RA VITAMIN D-3 2000 units CAPS take 1 capsule by mouth once daily 90 capsule 3   No current facility-administered medications on file prior to visit.      Review of Systems Review of Systems Constitutional: -fever, -chills, -sweats, -unexpected weight change,+fatigue ENT: -runny nose, -ear pain, -sore throat Cardiology:  -chest pain, -palpitations, -edema Respiratory: -cough, -shortness of breath, -wheezing Gastroenterology: -abdominal pain, -nausea, -vomiting, -diarrhea, -constipation Hematology: -bleeding or bruising problems Ophthalmology: -vision changes Urology: -dysuria, -difficulty urinating, -hematuria, -urinary frequency, -urgency Neurology: -headache, -weakness, -tingling, -numbness      Objective:   Physical Exam BP 128/84   Pulse 68   Wt 164 lb 9.6 oz (74.7 kg)   SpO2 99%   BMI 30.11 kg/m    Wt  Readings from Last 3 Encounters:  11/11/16 164 lb 9.6 oz (74.7 kg)  10/02/16 165 lb (74.8 kg)  08/15/16 162 lb 12.8 oz (73.8 kg)      General appearance: alert, no distress, WD/WN,  Oral cavity: MMM, no lesions Neck: supple, no lymphadenopathy, no thyromegaly, no masses Heart: RRR, normal S1, S2, no murmurs Lungs: CTA bilaterally, no wheezes, rhonchi, or rales Abdomen: +bs, soft, non tender, non distended, no masses, no hepatomegaly, no splenomegaly Back: non tender Musculoskeletal: hands and arms without deformity, nontender, no swelling, normal ROM without pain, hips and knees with normal ROM without pain, legs  nontender, no swelling, no obvious deformity Extremities: no edema, no cyanosis, no clubbing Pulses: 2+ symmetric, upper and lower extremities, normal cap refill Neurological: alert, oriented x 3, CN2-12 intact, strength normal upper extremities and lower extremities, sensation normal throughout, DTRs 2+ throughout, no cerebellar signs, gait normal Psychiatric: normal affect, behavior normal, pleasant      Assessment:     Encounter Diagnoses  Name Primary?  . Cramps, extremity Yes  . High risk medication use   . Osteoporosis, unspecified osteoporosis type, unspecified pathological fracture presence   . Vitamin D deficiency   . Menopausal state   . Myalgia        Plan:     discussed symptoms, possible causes.    Cramps, myalgias - discussed possible etiologies.  Labs today, including CK.  For now stop Pravachol temporarily to see if symptom improve.  If CK lab normal and symptoms improve with stopping statin, then may add statin back at lower dose in conjunction with Coenzyme Q10.  Consider OTC magnesium and potassium supplement as alternate treatment.  Osteoporosis - c/t fosamax, discussed continuing calcium and Vit D supplement, discussed getting a little more dairy in diet if she tolerates it, c/t working to get exercise regularly  Vit D deficiency - c/t OTC Vit D.   Holly [name] was seen today for discuss meds.  Diagnoses and all orders for this visit:  Cramps, extremity -     Comprehensive metabolic panel -     CK  High risk medication use -     Comprehensive metabolic panel -     CK  Osteoporosis, unspecified osteoporosis type, unspecified pathological fracture presence  Vitamin D deficiency  Menopausal state  Myalgia

## 2016-11-12 LAB — CK: Total CK: 111 U/L (ref 7–177)

## 2016-12-09 ENCOUNTER — Other Ambulatory Visit: Payer: Self-pay | Admitting: Medical

## 2016-12-10 NOTE — Telephone Encounter (Signed)
Is this okay to refill? 

## 2017-02-05 ENCOUNTER — Telehealth: Payer: Self-pay | Admitting: Medical

## 2017-02-05 LAB — HM MAMMOGRAPHY

## 2017-02-05 NOTE — Telephone Encounter (Signed)
I am happy to report that her mammogram was normal, no worrisome findings.

## 2017-02-06 NOTE — Telephone Encounter (Signed)
Called and l/m for pt about results of mammo.

## 2017-02-07 ENCOUNTER — Telehealth: Payer: Self-pay | Admitting: Medical

## 2017-02-07 NOTE — Telephone Encounter (Signed)
Pt called stating that she was contacted by Teola Bradley stating that [facility] has ordered for pt to have a bone density. Pt said she was not aware of this and wanted to make sure this is something that [facility] ordered and suggested.

## 2017-02-10 ENCOUNTER — Encounter: Payer: Self-pay | Admitting: Medical

## 2017-02-10 NOTE — Telephone Encounter (Signed)
Not sure why she got the call.   She had a bone density scan in 08/2016, so doesn't need repeat for 2 years.  Her 08/2016 study did show osteoporosis, so hopefully she is taking extra vitamin D and weekly Fosamax medication along with weight bearing and aerobic exercise.  See other message about mammogram results from last week

## 2017-02-14 ENCOUNTER — Ambulatory Visit
Admission: RE | Admit: 2017-02-14 | Discharge: 2017-02-14 | Disposition: A | Discharge status: Home / Self Care | Payer: BC Managed Care – PPO | Source: Ambulatory Visit | Attending: Medical | Admitting: Medical

## 2017-02-14 ENCOUNTER — Telehealth: Payer: Self-pay

## 2017-02-14 ENCOUNTER — Encounter: Payer: Self-pay | Admitting: Medical

## 2017-02-14 ENCOUNTER — Ambulatory Visit (INDEPENDENT_AMBULATORY_CARE_PROVIDER_SITE_OTHER): Payer: BC Managed Care – PPO | Admitting: Medical

## 2017-02-14 VITALS — BP 120/70 | HR 70 | Temp 97.7°F | Resp 16 | Wt 161.0 lb

## 2017-02-14 DIAGNOSIS — Z87891 Personal history of nicotine dependence: Secondary | ICD-10-CM

## 2017-02-14 DIAGNOSIS — M79605 Pain in left leg: Secondary | ICD-10-CM

## 2017-02-14 DIAGNOSIS — M79604 Pain in right leg: Secondary | ICD-10-CM

## 2017-02-14 DIAGNOSIS — R0989 Other specified symptoms and signs involving the circulatory and respiratory systems: Secondary | ICD-10-CM | POA: Diagnosis not present

## 2017-02-14 NOTE — Telephone Encounter (Signed)
Pt aware of appt time. She states she will be at [facility] with her husband Darell and advised her to call vascular to r/s. She was given the phone number to their department.

## 2017-02-14 NOTE — Progress Notes (Signed)
Subjective: Chief Complaint  Patient presents with  . joint aches    pt here for f/u of leg pains. Reports pain in the joints.    Here for pains in legs.  Wonders if its related to osteoporosis.  Lately pains in lower legs, worse at night, but not every night.  Pain is achy, bilat.  But also been having joint pains in fingers some.    Occasionally gets a knee pain.   No obvious restless legs.   No pain with walking specifically.  No hip pain.  No fever, no weight loss.   Stopped cholesterol medication 3 mo ago to see if this would help with leg aches.   No other aggravating or relieving factors. No other complaint.  Past Medical History:  Diagnosis Date  . Chronic constipation    enrolled in a constipation study with [facility] [name] 01/2013, on Plecanatide 6mg  daily  . Diverticulosis 01/2013   on colonoscopy  . H/O echocardiogram 03/2014   normal, Dr. Tollie [name]  . 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 gets once to twice yearly  . History of mammogram 01/2014   normal  . Hyperlipidemia   . Hypertension   . Menopausal disorder   . Microscopic hematuria 04/2013   urology eval, no worrisome causes.  recheck only with frank hematuria.  . Normal cardiac stress test 03/23/14   Dr. Tollie [name]  . S/P hysterectomy    due to fibroids  . Sleep difficulties    due to menopause  . SOB (shortness of breath)    cardiac and PFT eval 03/2014  . Tobacco use disorder   . Vitamin D deficiency   . Wears glasses    Current Outpatient Prescriptions on File Prior to Visit  Medication Sig Dispense Refill  . acetaminophen (TYLENOL) 500 MG tablet Take 500 mg by mouth every 6 (six) hours as needed.    Marland Kitchen alendronate (FOSAMAX) 70 MG tablet Take 1 tablet (70 mg total) by mouth once a week. Take with a full glass of water on an empty stomach. 12 tablet 3  . calcium-vitamin D (OSCAL-500) 500-400 MG-UNIT per tablet Take 1 tablet by mouth 2 (two) times daily. 180 tablet 3  . cloNIDine  (CATAPRES) 0.1 MG tablet take 1 tablet by mouth once daily 90 tablet 3  . fluticasone (FLONASE) 50 MCG/ACT nasal spray Place 1 spray into both nostrils 2 (two) times daily. 1 g 11  . loratadine (CLARITIN) 10 MG tablet Take 10 mg by mouth daily.    Marland Kitchen RA VITAMIN D-3 2000 units CAPS take 1 capsule by mouth once daily 90 capsule 3  . buPROPion (WELLBUTRIN XL) 150 MG 24 hr tablet Take 1 tablet (150 mg total) by mouth daily. (Patient not taking: Reported on 02/14/2017) 30 tablet 1  . pravastatin (PRAVACHOL) 40 MG tablet Take 1 tablet (40 mg total) by mouth daily. (Patient not taking: Reported on 02/14/2017) 90 tablet 3  . valACYclovir (VALTREX) 1000 MG tablet take 2 tablets by mouth twice a day for ONE DAY FOR FLARE UP (Patient not taking: Reported on 02/14/2017) 30 tablet 2   No current facility-administered medications on file prior to visit.     ROS as in subjective   Objective: BP 120/70   Pulse 70   Temp 97.7 F (36.5 C) (Oral)   Resp 16   Wt 161 lb (73 kg)   SpO2 97%   BMI 29.45 kg/m   Gen: wd, wn,  nad Pedal pulses 1+ barely palpable, but no swelling, legs nontender to palpation, no edema, no deformity, normal knee hip and ankle ROM, no deformity otherwise Monofilament exam normal Legs with normal strength, sensation and DTRs.  Skin: unremarkable of legs Heart rrr, normal s1, s2, no murmurs Lungs clear   Assessment: Encounter Diagnoses  Name Primary?  . Decreased pedal pulses Yes  . Former smoker   . Pain in both lower extremities      Plan: Begin trial of tonic water 1 cup BID, go for xray, will set up ABIs.   She is former smoker and pulses decreased possible PVD.   Etiology unclear though.  Will likely start back statin after this evaluation as the statin doesn't seem to be causing her pains.    Holly [name] was seen today for joint aches.  Diagnoses and all orders for this visit:  Decreased pedal pulses -     DG Tibia/Fibula Left; Future -     DG Tibia/Fibula Right;  Future -     VAS [place] ABI WITH/WO TBI; Future -     VAS [place] LOWER EXTREMITY ARTERIAL DUPLEX; Future  Former smoker -     DG Tibia/Fibula Left; Future -     DG Tibia/Fibula Right; Future -     VAS [place] ABI WITH/WO TBI; Future -     VAS [place] LOWER EXTREMITY ARTERIAL DUPLEX; Future  Pain in both lower extremities -     DG Tibia/Fibula Left; Future -     DG Tibia/Fibula Right; Future -     VAS [place] ABI WITH/WO TBI; Future -     VAS [place] LOWER EXTREMITY ARTERIAL DUPLEX; Future

## 2017-02-14 NOTE — Patient Instructions (Signed)
Get some Tonic water over the counter, and use 1 or 2 cups daily to see if it helps the leg pains  We will call with the blood flow study  Go for xray's of legs

## 2017-02-14 NOTE — Telephone Encounter (Signed)
ABI scheduled on 02/21/2017 @ 11AM. Pt needs to go to [facility] hospital and enter through admitting.  LMTCB for pt. Victorino December

## 2017-02-21 ENCOUNTER — Encounter (HOSPITAL_COMMUNITY): Payer: Self-pay

## 2017-02-24 ENCOUNTER — Ambulatory Visit (HOSPITAL_COMMUNITY)
Admission: RE | Admit: 2017-02-24 | Discharge: 2017-02-24 | Disposition: A | Discharge status: Home / Self Care | Payer: BC Managed Care – PPO | Source: Ambulatory Visit | Attending: Medical | Admitting: Medical

## 2017-02-24 DIAGNOSIS — Z87891 Personal history of nicotine dependence: Secondary | ICD-10-CM | POA: Diagnosis not present

## 2017-02-24 DIAGNOSIS — M79605 Pain in left leg: Secondary | ICD-10-CM

## 2017-02-24 DIAGNOSIS — M79604 Pain in right leg: Secondary | ICD-10-CM | POA: Diagnosis not present

## 2017-02-24 DIAGNOSIS — R0989 Other specified symptoms and signs involving the circulatory and respiratory systems: Secondary | ICD-10-CM | POA: Insufficient documentation

## 2017-02-24 NOTE — Progress Notes (Signed)
VASCULAR LAB PRELIMINARY  ARTERIAL  ABI completed: bilateral within normal limits.   RIGHT    LEFT    PRESSURE WAVEFORM  PRESSURE WAVEFORM  BRACHIAL 155 Tri BRACHIAL 149 Tri         AT 171 Tri AT 170 Tri  PT 171 Tri PT 177 Tri  PER   PER    GREAT TOE  NA GREAT TOE  NA    RIGHT LEFT  ABI 1.1 1.14    Landry Mellow, RDMS, RVT   02/24/2017, 3:31 PM

## 2017-02-27 ENCOUNTER — Other Ambulatory Visit: Payer: Self-pay | Admitting: Medical

## 2017-02-27 DIAGNOSIS — M5440 Lumbago with sciatica, unspecified side: Secondary | ICD-10-CM

## 2017-02-27 DIAGNOSIS — M79604 Pain in right leg: Secondary | ICD-10-CM

## 2017-02-27 DIAGNOSIS — M79605 Pain in left leg: Principal | ICD-10-CM

## 2017-02-27 MED ORDER — ROPINIROLE HCL 0.25 MG PO TABS: 0.2500 mg | ORAL_TABLET | Freq: Every day | ORAL | 1 refills | Status: DC

## 2017-04-15 ENCOUNTER — Encounter: Payer: Self-pay | Admitting: Medical

## 2017-04-15 ENCOUNTER — Ambulatory Visit (INDEPENDENT_AMBULATORY_CARE_PROVIDER_SITE_OTHER): Payer: BC Managed Care – PPO | Admitting: Medical

## 2017-04-15 VITALS — BP 122/78 | HR 82 | Wt 166.0 lb

## 2017-04-15 DIAGNOSIS — F4321 Adjustment disorder with depressed mood: Secondary | ICD-10-CM

## 2017-04-15 DIAGNOSIS — Z636 Dependent relative needing care at home: Secondary | ICD-10-CM | POA: Diagnosis not present

## 2017-04-15 DIAGNOSIS — H524 Presbyopia: Secondary | ICD-10-CM

## 2017-04-15 DIAGNOSIS — F4320 Adjustment disorder, unspecified: Secondary | ICD-10-CM | POA: Diagnosis not present

## 2017-04-15 NOTE — Progress Notes (Signed)
Subjective: Chief Complaint  Patient presents with  . discuss leave for her husband death    fill out form , wants go back 05/19/2017    Here for FMLA.   She has been taking care of her husband since his kidney cancer diagnosis back in 12/2016.  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  Over the summer while school was out, she was taking husband to cancer treatments and doctors visits and busy every day.  Since he went into hospice she has continued to be right by his side daily as caregiver and wife.  He unfortunately passed away on 04-14-17.  She would like to take time off starting from 04/05/17 - 05/19/17 to give her time to mourn and get her things in order.   Her daughter and 3 grandchildren ages 75yo, 15yo, and 1yo just moved in with her at request of her husband before his passing.   She is glad they are there to help take her mind off things.   She notes the funeral service was good and over 700 people attending.   She is thankful for her church family.   Her husband's brother preached his funeral.  They were married 39 years  Past Medical History:  Diagnosis Date  . Chronic constipation    enrolled in a constipation study with [facility] 01/2013, on Plecanatide 6mg  daily  . Diverticulosis 01/2013   on colonoscopy  . H/O echocardiogram 03/2014   normal, Dr. Tollie Eth  . 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 gets once to twice yearly  . History of mammogram 01/2014   normal  . Hyperlipidemia   . Hypertension   . Menopausal disorder   . Microscopic hematuria 04/2013   urology eval, no worrisome causes.  recheck only with frank hematuria.  . Normal cardiac stress test 03/23/14   Dr. Tollie Eth  . S/P hysterectomy    due to fibroids  . Sleep difficulties    due to menopause  . SOB (shortness of breath)    cardiac and PFT eval 03/2014  . Tobacco use disorder   . Vitamin D deficiency   . Wears glasses    Current Outpatient  Prescriptions on File Prior to Visit  Medication Sig Dispense Refill  . acetaminophen (TYLENOL) 500 MG tablet Take 500 mg by mouth every 6 (six) hours as needed.    Marland Kitchen alendronate (FOSAMAX) 70 MG tablet Take 1 tablet (70 mg total) by mouth once a week. Take with a full glass of water on an empty stomach. 12 tablet 3  . buPROPion (WELLBUTRIN XL) 150 MG 24 hr tablet Take 1 tablet (150 mg total) by mouth daily. 30 tablet 1  . calcium-vitamin D (OSCAL-500) 500-400 MG-UNIT per tablet Take 1 tablet by mouth 2 (two) times daily. 180 tablet 3  . cloNIDine (CATAPRES) 0.1 MG tablet take 1 tablet by mouth once daily 90 tablet 3  . fluticasone (FLONASE) 50 MCG/ACT nasal spray Place 1 spray into both nostrils 2 (two) times daily. 1 g 11  . loratadine (CLARITIN) 10 MG tablet Take 10 mg by mouth daily.    . pravastatin (PRAVACHOL) 40 MG tablet Take 1 tablet (40 mg total) by mouth daily. 90 tablet 3  . RA VITAMIN D-3 2000 units CAPS take 1 capsule by mouth once daily 90 capsule 3  . rOPINIRole (REQUIP) 0.25 MG tablet Take 1 tablet (0.25 mg total) by  mouth at bedtime. 30 tablet 1  . valACYclovir (VALTREX) 1000 MG tablet take 2 tablets by mouth twice a day for ONE DAY FOR FLARE UP 30 tablet 2   No current facility-administered medications on file prior to visit.    ROS as in subjective   Objective: BP 122/78   Pulse 82   Wt 166 lb (75.3 kg)   SpO2 97%   BMI 30.36 kg/m   Gen: wd, wn, nad Psych: pleasant, good eye contact    Assessment: Encounter Diagnoses  Name Primary?  . Mourning Yes  . Caregiver burden      Plan: expressed my sympathy for her situation and encouraged her .  Praised her for her and her husband's strong married and great example of a loving couple.   Completed her FMLA request.

## 2017-04-30 ENCOUNTER — Other Ambulatory Visit: Payer: Self-pay | Admitting: Medical

## 2017-04-30 DIAGNOSIS — M5440 Lumbago with sciatica, unspecified side: Secondary | ICD-10-CM

## 2017-04-30 DIAGNOSIS — M79605 Pain in left leg: Principal | ICD-10-CM

## 2017-04-30 DIAGNOSIS — M79604 Pain in right leg: Secondary | ICD-10-CM

## 2017-04-30 NOTE — Telephone Encounter (Signed)
Can pt have a refill on med 

## 2017-05-05 ENCOUNTER — Other Ambulatory Visit: Payer: Self-pay | Admitting: Medical

## 2017-05-05 DIAGNOSIS — M79605 Pain in left leg: Principal | ICD-10-CM

## 2017-05-05 DIAGNOSIS — M5440 Lumbago with sciatica, unspecified side: Secondary | ICD-10-CM

## 2017-05-05 DIAGNOSIS — M79604 Pain in right leg: Secondary | ICD-10-CM

## 2017-05-05 NOTE — Telephone Encounter (Signed)
Can pt have a refill on meds  

## 2017-05-12 ENCOUNTER — Ambulatory Visit: Payer: BC Managed Care – PPO | Admitting: Medical

## 2017-05-13 ENCOUNTER — Encounter: Payer: Self-pay | Admitting: Medical

## 2017-05-13 ENCOUNTER — Ambulatory Visit (INDEPENDENT_AMBULATORY_CARE_PROVIDER_SITE_OTHER): Payer: BC Managed Care – PPO | Admitting: Medical

## 2017-05-13 VITALS — BP 112/68 | HR 70 | Wt 163.2 lb

## 2017-05-13 DIAGNOSIS — M810 Age-related osteoporosis without current pathological fracture: Secondary | ICD-10-CM | POA: Diagnosis not present

## 2017-05-13 DIAGNOSIS — F4320 Adjustment disorder, unspecified: Secondary | ICD-10-CM | POA: Diagnosis not present

## 2017-05-13 DIAGNOSIS — N951 Menopausal and female climacteric states: Secondary | ICD-10-CM

## 2017-05-13 DIAGNOSIS — Z23 Encounter for immunization: Secondary | ICD-10-CM | POA: Diagnosis not present

## 2017-05-13 DIAGNOSIS — E785 Hyperlipidemia, unspecified: Secondary | ICD-10-CM | POA: Diagnosis not present

## 2017-05-13 DIAGNOSIS — F4321 Adjustment disorder with depressed mood: Secondary | ICD-10-CM

## 2017-05-13 NOTE — Progress Notes (Signed)
Subjective: Chief Complaint  Patient presents with  . discuss flma forms    discuss flma forms   Here for f/u.  I saw her 16-Apr-2017 after death of her husband recently.   At that time she had taken some time off work to mourn and get her thoughts together.  She notes her daughter is having a hard time, doing counseling 2 times per week.  However, Holly [name] is doing 2 counseling sessions per month.   She notes she needs to go back to work to help keep her mind engaged.  otherwise is sitting at home eating not doing anything.   She is ready to start back 05/19/17.   She has plans to get back to the gym, more active.   Is trying to eat healthy.  Not always taking her cholesterol medication but is taking fosamax.   otherwise is doing fine.    Past Medical History:  Diagnosis Date  . Chronic constipation    enrolled in a constipation study with [facility] 01/2013, on Plecanatide 6mg  daily  . Diverticulosis 01/2013   on colonoscopy  . H/O echocardiogram 03/2014   normal, Dr. Tollie Eth  . 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 gets once to twice yearly  . History of mammogram 01/2014   normal  . Hyperlipidemia   . Hypertension   . Menopausal disorder   . Microscopic hematuria 04/2013   urology eval, no worrisome causes.  recheck only with frank hematuria.  . Normal cardiac stress test 03/23/14   Dr. Tollie Eth  . S/P hysterectomy    due to fibroids  . Sleep difficulties    due to menopause  . SOB (shortness of breath)    cardiac and PFT eval 03/2014  . Tobacco use disorder   . Vitamin D deficiency   . Wears glasses    Current Outpatient Prescriptions on File Prior to Visit  Medication Sig Dispense Refill  . acetaminophen (TYLENOL) 500 MG tablet Take 500 mg by mouth every 6 (six) hours as needed.    Marland Kitchen alendronate (FOSAMAX) 70 MG tablet Take 1 tablet (70 mg total) by mouth once a week. Take with a full glass of water on an empty stomach. 12 tablet 3  . buPROPion  (WELLBUTRIN XL) 150 MG 24 hr tablet Take 1 tablet (150 mg total) by mouth daily. 30 tablet 1  . calcium-vitamin D (OSCAL-500) 500-400 MG-UNIT per tablet Take 1 tablet by mouth 2 (two) times daily. 180 tablet 3  . cloNIDine (CATAPRES) 0.1 MG tablet take 1 tablet by mouth once daily 90 tablet 3  . fluticasone (FLONASE) 50 MCG/ACT nasal spray Place 1 spray into both nostrils 2 (two) times daily. 1 g 11  . loratadine (CLARITIN) 10 MG tablet Take 10 mg by mouth daily.    . pravastatin (PRAVACHOL) 40 MG tablet Take 1 tablet (40 mg total) by mouth daily. 90 tablet 3  . RA VITAMIN D-3 2000 units CAPS take 1 capsule by mouth once daily 90 capsule 3  . rOPINIRole (REQUIP) 0.25 MG tablet take 1 tablet by mouth at bedtime 30 tablet 1  . valACYclovir (VALTREX) 1000 MG tablet take 2 tablets by mouth twice a day for ONE DAY FOR FLARE UP 30 tablet 2   No current facility-administered medications on file prior to visit.    ROS as in subjective   Objective: BP 112/68   Pulse 70   Wt 163 lb 3.2 oz (74 kg)  SpO2 96%   BMI 29.85 kg/m   Gen: wd, wn, nad Psych: pleasant, good eye contact    Assessment: Encounter Diagnoses  Name Primary?  . Mourning Yes  . Need for influenza vaccination   . Hyperlipidemia, unspecified hyperlipidemia type   . Menopausal state   . Osteoporosis, unspecified osteoporosis type, unspecified pathological fracture presence      Plan: Signed her work form to return to work.   counseled on having a routine, having regular exercise, eating healthy, keeping engaged at work, spending quality time with her grandchildren.   F/u in early part of 2019 for fasting labs/recheck.   Counseled on the influenza virus vaccine.  Vaccine information sheet given.  Influenza vaccine given after consent obtained.

## 2017-07-28 ENCOUNTER — Other Ambulatory Visit: Payer: Self-pay | Admitting: Medical

## 2017-08-20 ENCOUNTER — Encounter: Payer: Self-pay | Admitting: Medical

## 2017-08-20 ENCOUNTER — Ambulatory Visit: Payer: BC Managed Care – PPO | Admitting: Medical

## 2017-08-20 VITALS — BP 138/98 | Ht 64.0 in | Wt 166.8 lb

## 2017-08-20 DIAGNOSIS — M810 Age-related osteoporosis without current pathological fracture: Secondary | ICD-10-CM

## 2017-08-20 DIAGNOSIS — M2550 Pain in unspecified joint: Secondary | ICD-10-CM

## 2017-08-20 DIAGNOSIS — E2839 Other primary ovarian failure: Secondary | ICD-10-CM | POA: Diagnosis not present

## 2017-08-20 DIAGNOSIS — E785 Hyperlipidemia, unspecified: Secondary | ICD-10-CM | POA: Diagnosis not present

## 2017-08-20 DIAGNOSIS — Z87891 Personal history of nicotine dependence: Secondary | ICD-10-CM | POA: Diagnosis not present

## 2017-08-20 DIAGNOSIS — G4700 Insomnia, unspecified: Secondary | ICD-10-CM

## 2017-08-20 DIAGNOSIS — R030 Elevated blood-pressure reading, without diagnosis of hypertension: Secondary | ICD-10-CM | POA: Diagnosis not present

## 2017-08-20 DIAGNOSIS — M79605 Pain in left leg: Secondary | ICD-10-CM

## 2017-08-20 DIAGNOSIS — N951 Menopausal and female climacteric states: Secondary | ICD-10-CM

## 2017-08-20 DIAGNOSIS — R319 Hematuria, unspecified: Secondary | ICD-10-CM

## 2017-08-20 DIAGNOSIS — Z0000 Encounter for general adult medical examination without abnormal findings: Secondary | ICD-10-CM | POA: Diagnosis not present

## 2017-08-20 DIAGNOSIS — M5440 Lumbago with sciatica, unspecified side: Secondary | ICD-10-CM

## 2017-08-20 DIAGNOSIS — M79604 Pain in right leg: Secondary | ICD-10-CM

## 2017-08-20 DIAGNOSIS — Z1211 Encounter for screening for malignant neoplasm of colon: Secondary | ICD-10-CM | POA: Insufficient documentation

## 2017-08-20 DIAGNOSIS — E559 Vitamin D deficiency, unspecified: Secondary | ICD-10-CM | POA: Diagnosis not present

## 2017-08-20 DIAGNOSIS — Z79899 Other long term (current) drug therapy: Secondary | ICD-10-CM

## 2017-08-20 DIAGNOSIS — M79609 Pain in unspecified limb: Secondary | ICD-10-CM | POA: Insufficient documentation

## 2017-08-20 DIAGNOSIS — M791 Myalgia, unspecified site: Secondary | ICD-10-CM

## 2017-08-20 LAB — POCT URINALYSIS DIP (PROADVANTAGE DEVICE)
BILIRUBIN UA: NEGATIVE
BILIRUBIN UA: NEGATIVE mg/dL
Blood, UA: NEGATIVE
Glucose, UA: NEGATIVE mg/dL
Leukocytes, UA: NEGATIVE
Nitrite, UA: NEGATIVE
PROTEIN UA: NEGATIVE mg/dL
SPECIFIC GRAVITY, URINE: 1.02
Urobilinogen, Ur: 3.5
pH, UA: 7 (ref 5.0–8.0)

## 2017-08-20 MED ORDER — ROPINIROLE HCL 0.25 MG PO TABS: 0.2500 mg | ORAL_TABLET | Freq: Every day | ORAL | 3 refills | Status: DC

## 2017-08-20 MED ORDER — BUPROPION HCL ER (XL) 150 MG PO TB24: 150.0000 mg | ORAL_TABLET | Freq: Every day | ORAL | 3 refills | Status: DC

## 2017-08-20 MED ORDER — CLONIDINE HCL 0.1 MG PO TABS: 0.1000 mg | ORAL_TABLET | Freq: Every day | ORAL | 3 refills | Status: DC

## 2017-08-20 MED ORDER — ALENDRONATE SODIUM 70 MG PO TABS
ORAL_TABLET | ORAL | 3 refills | Status: DC
Start: 2017-08-20 — End: 2018-08-24

## 2017-08-20 MED ORDER — FLUTICASONE PROPIONATE 50 MCG/ACT NA SUSP: 1.0000 | Freq: Two times a day (BID) | NASAL | 11 refills | Status: DC

## 2017-08-20 NOTE — Addendum Note (Signed)
Addended by: Leighton Parody F on: 08/20/2017 11:33 AM   Modules accepted: Orders

## 2017-08-20 NOTE — Patient Instructions (Signed)
  Thank you for giving me the opportunity to serve you today.    Your diagnosis today includes: Encounter Diagnoses  Name Primary?  . Routine general medical examination at a health care facility Yes  . Osteoporosis, unspecified osteoporosis type, unspecified pathological fracture presence   . Estrogen deficiency   . Former smoker   . High risk medication use   . Hyperlipidemia, unspecified hyperlipidemia type   . Menopausal state   . Myalgia   . Vitamin D deficiency   . Pain in extremity, unspecified extremity   . Insomnia, unspecified type   . Elevated blood-pressure reading without diagnosis of hypertension   . Screen for colon cancer   . Polyarthralgia     Recommendations:  Eat a healthy low fat diet  Exercise most days per week, at least 30 minutes or more  See your eye doctor yearly for routine vision care.  See your dentist yearly for routine dental care including hygiene visits twice yearly.  I recommend you have a Shingles Vaccine to help prevent shingles or herpes zoster outbreak.   Please call your insurer to inquire about coverage for the Shingrix vaccine given in 2 doses.   Some insurers cover this vaccine after age 56, some cover this after age 45.  If your insurer covers this, then call to schedule appointment to have this vaccine here.  I recommend a yearly Influenza/Flu vaccine, typically in September to help reduce the risk of you and others getting the flu illness  We will call with lab results  Pending labs, I may have you stop Pravastatin for a few weeks to see if this is causing the aches/pains  Check you blood pressure at [facility] a few times per week as it was elevated today.  Normal is 120/70, but if pressures continue to be elevated, then we will need to recheck on this.  Let me know your blood pressure readings in a few weeks.  Return the stool cards in a week or so.   See me yearly for a physical

## 2017-08-20 NOTE — Progress Notes (Signed)
Subjective:   HPI  Holly [name] is a 62 y.o. female who presents for a physical Chief Complaint  Patient presents with  . Annual Exam    fasting    Medical care team includes: Carlena Hurl, PA-C here for primary care Dentist Eye doctor Dr. Erskine Emery, GI  Concerns: She reports ongoing aches in her bones, reports aches in her fingers, lower legs, left lower, if lie in bed too long, gets achy, right knee sore, no swelling.  Sometimes worse with cold weather.   She has chronic insomnia, achy legs at night.  She takes clonidine and Requip at night, she does note getting to bed okay but awakes several times per night.  She does watch TV in the bed.  She attributes her awakening to still adjusting to life without her husband and with having her 3 grandchildren ages 46 years old, 60 years old and 17 years old and daughter in the house.  The 45-year-old is wide open, lots of energy.  She also still works part-time at the [employer] including working [employer] late into the night this week  She has a history of constipation but no recent issues with this.  She reports that her GI doctor advised 10-year repeat on colonoscopy as of 2014  Reviewed their medical, surgical, family, social, medication, and allergy history and updated chart as appropriate.  Past Medical History:  Diagnosis Date  . Chronic constipation    enrolled in a constipation study with [place] 01/2013, on Plecanatide 6mg  daily  . Diverticulosis 01/2013   on colonoscopy  . H/O echocardiogram 03/2014   normal, Dr. Tollie Eth  . 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 gets once to twice yearly  . History of mammogram 01/2014   normal  . Hyperlipidemia   . Hypertension   . Menopausal disorder   . Microscopic hematuria 04/2013   urology eval, no worrisome causes.  recheck only with frank hematuria.  . Normal cardiac stress test 03/23/14   Dr. Tollie Eth  . S/P hysterectomy    due  to fibroids  . Sleep difficulties    due to menopause  . SOB (shortness of breath)    cardiac and PFT eval 03/2014  . Tobacco use disorder   . Vitamin D deficiency   . Wears glasses     Past Surgical History:  Procedure Laterality Date  . COLONOSCOPY  01/2013   external hemorrohids, severe melanosis, diverticulosis; Dr. Deatra Ina.  prior colonoscopy Dr. Collene Mares.  . ECTOPIC PREGNANCY SURGERY  1990  . PARTIAL HYSTERECTOMY  1989   still has 1 ovary; hx/o uterine fibroids    Social History   Socioeconomic History  . Marital status: Married    Spouse name: Not on file  . Number of children: Not on file  . Years of education: Not on file  . Highest education level: Not on file  Social Needs  . Financial resource strain: Not on file  . Food insecurity - worry: Not on file  . Food insecurity - inability: Not on file  . Transportation needs - medical: Not on file  . Transportation needs - non-medical: Not on file  Occupational History  . Occupation: bus Education administrator: [employer]  Tobacco Use  . Smoking status: Former Smoker    Packs/day: 0.30    Years: 17.00    Pack years: 5.10    Types: Cigarettes    Last  attempt to quit: 07/16/2016    Years since quitting: 1.0  . Smokeless tobacco: Never Used  Substance and Sexual Activity  . Alcohol use: No  . Drug use: No  . Sexual activity: Not on file    Comment: married, 2 chlidren, school bus driver, exercise with walking  Other Topics Concern  . Not on file  Social History Narrative   Bus driver, works at [employer], exercises with walking, married, has 2 children, ages 66yo and 44yo, both live in [place], 4 grandchildren.   Husband with metastatic cancer 2017, renal cell.   As of 07/2016    Family History  Problem Relation Age of Onset  . Hyperlipidemia Mother   . Hypertension Mother   . Diabetes Mother   . Hyperlipidemia Father   . Hypertension Father   . Diabetes Father   . Stroke Brother         during surgery  . Cancer Neg Hx   . Heart disease Neg Hx      Current Outpatient Medications:  .  acetaminophen (TYLENOL) 500 MG tablet, Take 500 mg by mouth every 6 (six) hours as needed., Disp: , Rfl:  .  alendronate (FOSAMAX) 70 MG tablet, take 1 tablet by mouth every week TAKE WITH A FULL GLASS OF WATER ON AN EMPTY STOMACH, Disp: 12 tablet, Rfl: 3 .  buPROPion (WELLBUTRIN XL) 150 MG 24 hr tablet, Take 1 tablet (150 mg total) by mouth daily., Disp: 90 tablet, Rfl: 3 .  calcium-vitamin D (OSCAL-500) 500-400 MG-UNIT per tablet, Take 1 tablet by mouth 2 (two) times daily., Disp: 180 tablet, Rfl: 3 .  cloNIDine (CATAPRES) 0.1 MG tablet, Take 1 tablet (0.1 mg total) by mouth daily., Disp: 90 tablet, Rfl: 3 .  fluticasone (FLONASE) 50 MCG/ACT nasal spray, Place 1 spray into both nostrils 2 (two) times daily., Disp: 1 g, Rfl: 11 .  loratadine (CLARITIN) 10 MG tablet, Take 10 mg by mouth daily., Disp: , Rfl:  .  Naproxen Sod-Diphenhydramine (ALEVE PM PO), Take by mouth., Disp: , Rfl:  .  Omega-3 Fatty Acids (FISH OIL) 1200 MG CPDR, Take by mouth., Disp: , Rfl:  .  pravastatin (PRAVACHOL) 40 MG tablet, Take 1 tablet (40 mg total) by mouth daily., Disp: 90 tablet, Rfl: 3 .  RA VITAMIN D-3 2000 units CAPS, take 1 capsule by mouth once daily, Disp: 90 capsule, Rfl: 3 .  rOPINIRole (REQUIP) 0.25 MG tablet, Take 1 tablet (0.25 mg total) by mouth at bedtime., Disp: 90 tablet, Rfl: 3 .  valACYclovir (VALTREX) 1000 MG tablet, take 2 tablets by mouth twice a day for ONE DAY FOR FLARE UP, Disp: 30 tablet, Rfl: 2  Allergies  Allergen Reactions  . Aspirin     Upsets her stomach taking daily  . Penicillins Rash    Review of Systems Constitutional: -fever, -chills, -sweats, -unexpected weight change, -decreased appetite, -fatigue Allergy: -sneezing, -itching, -congestion Dermatology: -changing moles, --rash, -lumps ENT: -runny nose, -ear pain, -sore throat, -hoarseness, -sinus pain, -teeth pain, -  ringing in ears, -hearing loss, -nosebleeds Cardiology: -chest pain, -palpitations, -swelling, -difficulty breathing when lying flat, -waking up short of breath Respiratory: -cough, -shortness of breath, -difficulty breathing with exercise or exertion, -wheezing, -coughing up blood Gastroenterology: -abdominal pain, -nausea, -vomiting, -diarrhea, -constipation, -blood in stool, -changes in bowel movement, -difficulty swallowing or eating Hematology: -bleeding, -bruising  Musculoskeletal: -joint aches, +bone aches, +muscle aches, -joint swelling, -back pain, -neck pain, -cramping, -changes in gait Ophthalmology: denies vision changes, eye  redness, itching, discharge Urology: -burning with urination, -difficulty urinating, -blood in urine, -urinary frequency, -urgency, -incontinence Neurology: -headache, -weakness, -tingling, -numbness, -memory loss, -falls, -dizziness Psychology: -depressed mood, -agitation, +sleep problems Breast/gyn: -breast tenderness, -discharge, -lumps, -vaginal discharge,- irregular periods, -heavy periods       Objective:   BP (!) 138/98   Ht 5\' 4"  (1.626 m)   Wt 166 lb 12.8 oz (75.7 kg)   BMI 28.63 kg/m   Wt Readings from Last 3 Encounters:  08/20/17 166 lb 12.8 oz (75.7 kg)  05/13/17 163 lb 3.2 oz (74 kg)  04/15/17 166 lb (75.3 kg)   BP Readings from Last 3 Encounters:  08/20/17 (!) 138/98  05/13/17 112/68  04/15/17 122/78    General appearance: alert, no distress, WD/WN, African American female Skin: right lateral orbit with raised papular 10mm lesion unchanged per patient, right lateral chin with smilar benign appearing unchanged 77mm diameter lesion, no other worrisome lesions HEENT: normocephalic, conjunctiva/corneas normal, sclerae anicteric, PERRLA, EOMi, nares patent, no discharge or erythema, pharynx normal Oral cavity: MMM, tongue normal, teeth in good repair Neck: supple, no lymphadenopathy, no thyromegaly, no masses, normal ROM, no bruits Chest:  non tender, normal shape and expansion Heart: RRR, normal S1, S2, no murmurs Lungs: CTA bilaterally, no wheezes, rhonchi, or rales Abdomen: +bs, soft, non tender, non distended, no masses, no hepatomegaly, no splenomegaly, no bruits Back: non tender, normal ROM, no scoliosis Musculoskeletal: upper extremities non tender, no obvious deformity, normal ROM throughout, lower extremities non tender, no obvious deformity, normal ROM throughout Extremities: no edema, no cyanosis, no clubbing Pulses: 2+ symmetric, upper and lower extremities, normal cap refill Neurological: alert, oriented x 3, CN2-12 intact, strength normal upper extremities and lower extremities, sensation normal throughout, DTRs 2+ throughout, no cerebellar signs, gait normal Psychiatric: normal affect, behavior normal, pleasant  Breast/gyn/rectal - declined by patient today   Assessment and Plan :    Encounter Diagnoses  Name Primary?  . Routine general medical examination at a health care facility Yes  . Osteoporosis, unspecified osteoporosis type, unspecified pathological fracture presence   . Estrogen deficiency   . Former smoker   . High risk medication use   . Hyperlipidemia, unspecified hyperlipidemia type   . Menopausal state   . Myalgia   . Vitamin D deficiency   . Pain in extremity, unspecified extremity   . Insomnia, unspecified type   . Elevated blood-pressure reading without diagnosis of hypertension   . Screen for colon cancer   . Polyarthralgia   . Hematuria, unspecified type   . Pain in both lower extremities   . Low back pain with sciatica, sciatica laterality unspecified, unspecified back pain laterality, unspecified chronicity     Physical exam - discussed and counseled on healthy lifestyle, diet, exercise, preventative care, vaccinations, sick and well care, proper use of emergency dept and after hours care, and addressed their concerns.    Health screening: I reviewed her June 2018 mammogram,  January 2018 bone density scan consistent with osteoporosis She is status post hysterectomy  See your eye doctor yearly for routine vision care. See your dentist yearly for routine dental care including hygiene visits twice yearly.  Cancer screening I reviewed her 2014 colonoscopy results from Dr. Deatra Ina.  She will do stool cards x3 and return these  Vaccinations: Counseled on the following vaccines:  I recommend you have a shingles vaccine to help prevent shingles or herpes zoster outbreak.   Please call your insurer to inquire about coverage for  the Shingrix vaccine given in 2 doses.   Some insurers cover this vaccine after age 40, some cover this after age 59.  If your insurer covers this, then call to schedule appointment to have this vaccine here.   Acute issues discussed: none  Separate significant chronic issues discussed: Osteoporosis-continue Fosamax, weightbearing and aerobic exercise Pain throughout extremities- possibly related to pravastatin, labs today, consider short-term trial off pravastatin, consider bone scan  insomnia-discussed sleep hygiene,We discussed clonidine for sleep and hot flashes.   Discussed Requip that she uses for RLS/night cramps.   Discussed ways to work on sleep hygiene and clearing her mind before bedtime.     Hematuria on UA - will send for microscopic and culture.   Keiona was seen today for annual exam.  Diagnoses and all orders for this visit:  Routine general medical examination at a health care facility -     Lipid panel -     Comprehensive metabolic panel -     CBC -     Hemoglobin A1c -     Sedimentation rate -     CK -     VITAMIN D 25 Hydroxy (Vit-D Deficiency, Fractures) -     Urine Culture -     Urinalysis, microscopic only  Osteoporosis, unspecified osteoporosis type, unspecified pathological fracture presence  Estrogen deficiency  Former smoker  High risk medication use -     Sedimentation rate -      CK  Hyperlipidemia, unspecified hyperlipidemia type -     Lipid panel  Menopausal state  Myalgia  Vitamin D deficiency -     VITAMIN D 25 Hydroxy (Vit-D Deficiency, Fractures)  Pain in extremity, unspecified extremity -     Sedimentation rate -     CK  Insomnia, unspecified type  Elevated blood-pressure reading without diagnosis of hypertension  Screen for colon cancer  Polyarthralgia -     Sedimentation rate -     CK  Hematuria, unspecified type -     Urine Culture -     Urinalysis, microscopic only  Pain in both lower extremities -     rOPINIRole (REQUIP) 0.25 MG tablet; Take 1 tablet (0.25 mg total) by mouth at bedtime.  Low back pain with sciatica, sciatica laterality unspecified, unspecified back pain laterality, unspecified chronicity -     rOPINIRole (REQUIP) 0.25 MG tablet; Take 1 tablet (0.25 mg total) by mouth at bedtime.  Other orders -     alendronate (FOSAMAX) 70 MG tablet; take 1 tablet by mouth every week TAKE WITH A FULL GLASS OF WATER ON AN EMPTY STOMACH -     buPROPion (WELLBUTRIN XL) 150 MG 24 hr tablet; Take 1 tablet (150 mg total) by mouth daily. -     fluticasone (FLONASE) 50 MCG/ACT nasal spray; Place 1 spray into both nostrils 2 (two) times daily. -     cloNIDine (CATAPRES) 0.1 MG tablet; Take 1 tablet (0.1 mg total) by mouth daily.   Follow-up pending labs, yearly for physical

## 2017-08-21 LAB — LIPID PANEL
Cholesterol: 150 mg/dL (ref <200)
HDL: 48 mg/dL — ABNORMAL LOW (ref >50)
LDL Cholesterol (Calc): 84 mg/dL (calc)
Non-HDL Cholesterol (Calc): 102 mg/dL (calc) (ref <130)
TRIGLYCERIDES: 89 mg/dL (ref <150)
Total CHOL/HDL Ratio: 3.1 (calc) (ref <5.0)

## 2017-08-21 LAB — COMPREHENSIVE METABOLIC PANEL
AG Ratio: 1.6 (calc) (ref 1.0–2.5)
ALKALINE PHOSPHATASE (APISO): 54 U/L (ref 33–130)
ALT: 7 U/L (ref 6–29)
AST: 16 U/L (ref 10–35)
Albumin: 4.2 g/dL (ref 3.6–5.1)
BILIRUBIN TOTAL: 0.5 mg/dL (ref 0.2–1.2)
BUN: 12 mg/dL (ref 7–25)
CO2: 27 mmol/L (ref 20–32)
Calcium: 9 mg/dL (ref 8.6–10.4)
Chloride: 107 mmol/L (ref 98–110)
Creat: 0.51 mg/dL (ref 0.50–1.05)
Globulin: 2.6 g/dL (calc) (ref 1.9–3.7)
Glucose, Bld: 75 mg/dL (ref 65–99)
Potassium: 4 mmol/L (ref 3.5–5.3)
Sodium: 141 mmol/L (ref 135–146)
TOTAL PROTEIN: 6.8 g/dL (ref 6.1–8.1)

## 2017-08-21 LAB — CBC
HEMATOCRIT: 39.3 % (ref 35.0–45.0)
Hemoglobin: 12.7 g/dL (ref 11.7–15.5)
MCH: 27.6 pg (ref 27.0–33.0)
MCHC: 32.3 g/dL (ref 32.0–36.0)
MCV: 85.4 fL (ref 80.0–100.0)
MPV: 11.2 fL (ref 7.5–12.5)
Platelets: 255 10*3/uL (ref 140–400)
RBC: 4.6 10*6/uL (ref 3.80–5.10)
RDW: 13 % (ref 11.0–15.0)
WBC: 8.1 10*3/uL (ref 3.8–10.8)

## 2017-08-21 LAB — URINE CULTURE
MICRO NUMBER:: 90004230
Result:: NO GROWTH
SPECIMEN QUALITY:: ADEQUATE

## 2017-08-21 LAB — HEMOGLOBIN A1C
EAG (MMOL/L): 6.8 (calc)
HEMOGLOBIN A1C: 5.9 %{Hb} — AB (ref <5.7)
MEAN PLASMA GLUCOSE: 123 (calc)

## 2017-08-21 LAB — SEDIMENTATION RATE: Sed Rate: 6 mm/h (ref 0–30)

## 2017-08-21 LAB — URINALYSIS, MICROSCOPIC ONLY

## 2017-08-21 LAB — VITAMIN D 25 HYDROXY (VIT D DEFICIENCY, FRACTURES): Vit D, 25-Hydroxy: 42 ng/mL (ref 30–100)

## 2017-08-21 LAB — CK: CK TOTAL: 103 U/L (ref 29–143)

## 2017-08-26 ENCOUNTER — Telehealth: Payer: Self-pay | Admitting: Medical

## 2017-08-26 NOTE — Telephone Encounter (Signed)
Pt called and stated her insurance will pay for shingrix. Please advise pt if available. She can be reached at [phone].

## 2017-08-26 NOTE — Telephone Encounter (Signed)
Made an appt

## 2017-08-28 ENCOUNTER — Other Ambulatory Visit (INDEPENDENT_AMBULATORY_CARE_PROVIDER_SITE_OTHER): Payer: BC Managed Care – PPO

## 2017-08-28 DIAGNOSIS — Z1211 Encounter for screening for malignant neoplasm of colon: Secondary | ICD-10-CM | POA: Diagnosis not present

## 2017-08-28 DIAGNOSIS — Z23 Encounter for immunization: Secondary | ICD-10-CM

## 2017-08-28 LAB — POC HEMOCCULT BLD/STL (HOME/3-CARD/SCREEN)
Card #3 Fecal Occult Blood, POC: NEGATIVE
FECAL OCCULT BLD: NEGATIVE
FECAL OCCULT BLD: NEGATIVE

## 2017-08-28 NOTE — Telephone Encounter (Signed)
Pt came in for shingrix  Vaccine  And wanted have b/p check her 142/90  61 pulse. She said that she  [facility]ing [facility] b/[facility] at [facility]  And

## 2017-10-09 ENCOUNTER — Other Ambulatory Visit: Payer: Self-pay | Admitting: Medical

## 2017-11-26 ENCOUNTER — Ambulatory Visit: Payer: BC Managed Care – PPO | Admitting: Family Medicine

## 2017-11-26 ENCOUNTER — Encounter: Payer: Self-pay | Admitting: Family Medicine

## 2017-11-26 VITALS — BP 126/84 | HR 81 | Temp 98.3°F | Ht 64.0 in | Wt 163.4 lb

## 2017-11-26 DIAGNOSIS — J4521 Mild intermittent asthma with (acute) exacerbation: Secondary | ICD-10-CM

## 2017-11-26 MED ORDER — ALBUTEROL SULFATE HFA 108 (90 BASE) MCG/ACT IN AERS: 2.0000 | INHALATION_SPRAY | Freq: Four times a day (QID) | RESPIRATORY_TRACT | 2 refills | Status: DC | PRN

## 2017-11-26 NOTE — Progress Notes (Signed)
   Subjective:    Patient ID: Holly [name], female    DOB: Feb 08, 1956, 62 y.o.   MRN: [id number]  HPI She has a 5-day history of difficulty with cough and notes that being outside in the pollen and also around colognes causes her to cough even more.  No fever, chills, sore throat.  She does have underlying allergies and presently is on Flonase and Claritin.   Review of Systems     Objective:   Physical Exam Alert and in no distress. Tympanic membranes and canals are normal. Pharyngeal area is normal. Neck is supple without adenopathy or thyromegaly. Cardiac exam shows a regular sinus rhythm without murmurs or gallops. Lungs are clear to auscultation.       Assessment & Plan:  Mild intermittent extrinsic asthma with acute exacerbation - Plan: albuterol (PROVENTIL HFA;VENTOLIN HFA) 108 (90 Base) MCG/ACT inhaler I explained that allergies and asthma wound together.  Demonstrated how to use the Proventil.  If she continues have difficulty, she is going to be treated for further consultation.

## 2017-12-01 ENCOUNTER — Telehealth: Payer: Self-pay | Admitting: Medical

## 2017-12-01 MED ORDER — AZITHROMYCIN 500 MG PO TABS: 500.0000 mg | ORAL_TABLET | Freq: Every day | ORAL | 0 refills | Status: DC

## 2017-12-01 MED ORDER — BENZONATATE 100 MG PO CAPS: 200.0000 mg | ORAL_CAPSULE | Freq: Three times a day (TID) | ORAL | 0 refills | Status: DC | PRN

## 2017-12-01 NOTE — Telephone Encounter (Signed)
Let her know that I called an antibiotic in as well as a cough medicine.  Let her know that the antibiotic will work for a full week even though she only takes it for 3 days

## 2017-12-01 NOTE — Telephone Encounter (Signed)
Pt said inhaler in not helping her at all. She said she is prone to these same symptoms and Holly [name] usually gives her an antibiotic and cough meds to clear it all up. Pt said she keeps coughing and coughing up yellow mucus.

## 2017-12-01 NOTE — Telephone Encounter (Signed)
Pt was made aware of abx  . [place]

## 2018-02-06 LAB — HM MAMMOGRAPHY

## 2018-02-11 ENCOUNTER — Telehealth: Payer: Self-pay | Admitting: Medical

## 2018-02-11 NOTE — Telephone Encounter (Signed)
I am happy to report that her mammogram was normal, no worrisome findings.

## 2018-02-11 NOTE — Telephone Encounter (Signed)
Patient notified of mammogram results.

## 2018-04-17 ENCOUNTER — Other Ambulatory Visit: Payer: Self-pay | Admitting: Medical

## 2018-04-17 ENCOUNTER — Telehealth: Payer: Self-pay | Admitting: Medical

## 2018-04-17 MED ORDER — ALPRAZOLAM 0.5 MG PO TABS: 0.5000 mg | ORAL_TABLET | Freq: Three times a day (TID) | ORAL | 0 refills | Status: DC | PRN

## 2018-04-17 NOTE — Telephone Encounter (Signed)
Med sent.

## 2018-04-17 NOTE — Telephone Encounter (Signed)
Pt states dad just passed away a few minutes ago and she would something called into [facility] [place] very hard time because lost her husband a year ago.

## 2018-04-23 ENCOUNTER — Ambulatory Visit: Payer: BC Managed Care – PPO | Admitting: Medical

## 2018-04-23 VITALS — BP 140/90 | HR 74 | Temp 98.0°F | Resp 16 | Ht 64.0 in | Wt 158.8 lb

## 2018-04-23 DIAGNOSIS — F4321 Adjustment disorder with depressed mood: Secondary | ICD-10-CM

## 2018-04-23 DIAGNOSIS — K6389 Other specified diseases of intestine: Secondary | ICD-10-CM

## 2018-04-23 DIAGNOSIS — Z800 Family history of malignant neoplasm of digestive organs: Secondary | ICD-10-CM | POA: Diagnosis not present

## 2018-04-23 NOTE — Progress Notes (Signed)
Subjective: Chief Complaint  Patient presents with  . fmla    patient lost her dad wants FMLA   Here for concerns  She is here for [facility] paperwork.  Her father was seen by me a couple weeks ago for belly pain at which time we found a large tumor.  He ultimately passed away on 05-06-2018.  He had been seeing oncology.  She is grieving, would like some time out of work.  She is having to take care of her mother who is having a really hard time dealing with the passing of her father.  Her mother also has dementia.  Feleica needs time out of work to help take her to doctor's appointments to help get their affairs in order and to help mom who is grieving as well.  Britteny is still dealing with the death of her husband this past year who is also my patient.  He had kidney cancer.  She requested a period of time from 04/21/2018 through 05/18/2018.   She also notes that her twin brother had colonoscopy this year after Lindy's husband passed, and mass was found on his colon.  He was diagnosed with stage 1 colon cancer.    She needs referral back for updated colonoscopy.  Past Medical History:  Diagnosis Date  . Chronic constipation    enrolled in a constipation study with [place] 01/2013, on Plecanatide 6mg  daily  . Diverticulosis 01/2013   on colonoscopy  . H/O echocardiogram 03/2014   normal, Dr. Tollie Eth  . 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 gets once to twice yearly  . History of mammogram 01/2014   normal  . Hyperlipidemia   . Hypertension   . Menopausal disorder   . Microscopic hematuria 04/2013   urology eval, no worrisome causes.  recheck only with frank hematuria.  . Normal cardiac stress test 03/23/14   Dr. Tollie Eth  . S/P hysterectomy    due to fibroids  . Sleep difficulties    due to menopause  . SOB (shortness of breath)    cardiac and PFT eval 03/2014  . Tobacco use disorder   . Vitamin D deficiency   . Wears glasses    Current  Outpatient Medications on File Prior to Visit  Medication Sig Dispense Refill  . acetaminophen (TYLENOL) 500 MG tablet Take 500 mg by mouth every 6 (six) hours as needed.    Marland Kitchen albuterol (PROVENTIL HFA;VENTOLIN HFA) 108 (90 Base) MCG/ACT inhaler Inhale 2 puffs into the lungs every 6 (six) hours as needed for wheezing or shortness of breath. 1 Inhaler 2  . alendronate (FOSAMAX) 70 MG tablet take 1 tablet by mouth every week TAKE WITH A FULL GLASS OF WATER ON AN EMPTY STOMACH 12 tablet 3  . ALPRAZolam (XANAX) 0.5 MG tablet Take 1 tablet (0.5 mg total) by mouth 3 (three) times daily as needed for sleep or anxiety. 20 tablet 0  . azithromycin (ZITHROMAX) 500 MG tablet Take 1 tablet (500 mg total) by mouth daily. 3 tablet 0  . benzonatate (TESSALON) 100 MG capsule Take 2 capsules (200 mg total) by mouth 3 (three) times daily as needed for cough. 20 capsule 0  . buPROPion (WELLBUTRIN XL) 150 MG 24 hr tablet Take 1 tablet (150 mg total) by mouth daily. 90 tablet 3  . calcium-vitamin D (OSCAL-500) 500-400 MG-UNIT per tablet Take 1 tablet by mouth 2 (two) times daily. 180 tablet 3  . cloNIDine (CATAPRES) 0.1  MG tablet Take 1 tablet (0.1 mg total) by mouth daily. 90 tablet 3  . fluticasone (FLONASE) 50 MCG/ACT nasal spray Place 1 spray into both nostrils 2 (two) times daily. 1 g 11  . loratadine (CLARITIN) 10 MG tablet Take 10 mg by mouth daily.    . Naproxen Sod-Diphenhydramine (ALEVE PM PO) Take by mouth.    . Omega-3 Fatty Acids (FISH OIL) 1200 MG CPDR Take by mouth.    . pravastatin (PRAVACHOL) 40 MG tablet take 1 tablet by mouth once daily 90 tablet 1  . RA VITAMIN D-3 2000 units CAPS take 1 capsule by mouth once daily 90 capsule 3  . rOPINIRole (REQUIP) 0.25 MG tablet Take 1 tablet (0.25 mg total) by mouth at bedtime. 90 tablet 3  . valACYclovir (VALTREX) 1000 MG tablet take 2 tablets by mouth twice a day for ONE DAY FOR FLARE UP 30 tablet 2   No current facility-administered medications on file prior  to visit.    ROS as in subjective  Objective: BP 140/90   Pulse 74   Temp 98 F (36.7 C) (Oral)   Resp 16   Ht 5\' 4"  (1.626 m)   Wt 158 lb 12.8 oz (72 kg)   SpO2 97%   BMI 27.26 kg/m   Gen: wd, wn, nad Psych: pleasant, answers questions appropriately    Assessment: Encounter Diagnoses  Name Primary?  . Grieving Yes  . Family history of colon cancer   . Melanosis coli     Plan: I expressed my sympathy for her loss. Completed FMLA form We talked a bit about her father, his recent cancer and her dealing with taking care of her mother and herself. We discussed her brother's recent diagnosis of colon cancer Referral back to gastroenterology for updated colonoscopy since her twin brother has this recent diagnosis I reviewed her colonoscopy from 2014 showing severe melanosis, diverticulosis, but no polyps or tumors We discussed cancer screenings in general.   I reviewed her mammogram from June 2019 which was normal She is status post partial hysterectomy.  She has been good about coming in for routine physical and pelvic exam I will see her back in January 2020 for physical and routine screening, sooner as needed  Siennah was seen today for fmla.  Diagnoses and all orders for this visit:  Grieving  Family history of colon cancer  Melanosis coli

## 2018-05-11 ENCOUNTER — Encounter: Payer: Self-pay | Admitting: Medical

## 2018-05-11 ENCOUNTER — Ambulatory Visit: Payer: BC Managed Care – PPO | Admitting: Medical

## 2018-05-11 VITALS — BP 164/110 | HR 96 | Temp 98.2°F | Wt 155.2 lb

## 2018-05-11 DIAGNOSIS — Z79899 Other long term (current) drug therapy: Secondary | ICD-10-CM

## 2018-05-11 DIAGNOSIS — R030 Elevated blood-pressure reading, without diagnosis of hypertension: Secondary | ICD-10-CM | POA: Diagnosis not present

## 2018-05-11 DIAGNOSIS — Z23 Encounter for immunization: Secondary | ICD-10-CM | POA: Diagnosis not present

## 2018-05-11 DIAGNOSIS — Z636 Dependent relative needing care at home: Secondary | ICD-10-CM | POA: Diagnosis not present

## 2018-05-11 NOTE — Progress Notes (Signed)
Subjective: Chief Complaint  Patient presents with  . other    wants to return to work    Here for concerns  Her grandson was born today named after her late husband.  [facility] and [facility].  She missed her dose of clonidine last night.  She notes that her brother has her aggravated today and she is here with her mother today to see Dr. Redmond School in the office, still dealing with the recent loss of her father to cancer.  She attributes to blood pressure elevation to the stress  She has no chest pain, no edema, no shortness of breath  At her last visit we did FMLA paperwork while she agrees the loss of her father and dealing with helping her mother get back on track with life in general.  She is ready to go back to work in a week needs a note for clearance   Past Medical History:  Diagnosis Date  . Chronic constipation    enrolled in a constipation study with [facility] 01/2013, on Plecanatide 6mg  daily  . Diverticulosis 01/2013   on colonoscopy  . H/O echocardiogram 03/2014   normal, Dr. Tollie Eth  . 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 gets once to twice yearly  . History of mammogram 01/2014   normal  . Hyperlipidemia   . Hypertension   . Menopausal disorder   . Microscopic hematuria 04/2013   urology eval, no worrisome causes.  recheck only with frank hematuria.  . Normal cardiac stress test 03/23/14   Dr. Tollie Eth  . S/P hysterectomy    due to fibroids  . Sleep difficulties    due to menopause  . SOB (shortness of breath)    cardiac and PFT eval 03/2014  . Tobacco use disorder   . Vitamin D deficiency   . Wears glasses    Current Outpatient Medications on File Prior to Visit  Medication Sig Dispense Refill  . acetaminophen (TYLENOL) 500 MG tablet Take 500 mg by mouth every 6 (six) hours as needed.    Marland Kitchen albuterol (PROVENTIL HFA;VENTOLIN HFA) 108 (90 Base) MCG/ACT inhaler Inhale 2 puffs into the lungs every 6  (six) hours as needed for wheezing or shortness of breath. 1 Inhaler 2  . alendronate (FOSAMAX) 70 MG tablet take 1 tablet by mouth every week TAKE WITH A FULL GLASS OF WATER ON AN EMPTY STOMACH 12 tablet 3  . ALPRAZolam (XANAX) 0.5 MG tablet Take 1 tablet (0.5 mg total) by mouth 3 (three) times daily as needed for sleep or anxiety. 20 tablet 0  . azithromycin (ZITHROMAX) 500 MG tablet Take 1 tablet (500 mg total) by mouth daily. 3 tablet 0  . benzonatate (TESSALON) 100 MG capsule Take 2 capsules (200 mg total) by mouth 3 (three) times daily as needed for cough. 20 capsule 0  . buPROPion (WELLBUTRIN XL) 150 MG 24 hr tablet Take 1 tablet (150 mg total) by mouth daily. 90 tablet 3  . calcium-vitamin D (OSCAL-500) 500-400 MG-UNIT per tablet Take 1 tablet by mouth 2 (two) times daily. 180 tablet 3  . cloNIDine (CATAPRES) 0.1 MG tablet Take 1 tablet (0.1 mg total) by mouth daily. 90 tablet 3  . fluticasone (FLONASE) 50 MCG/ACT nasal spray Place 1 spray into both nostrils 2 (two) times daily. 1 g 11  . loratadine (CLARITIN) 10 MG tablet Take 10 mg by mouth daily.    . Naproxen Sod-Diphenhydramine (  ALEVE PM PO) Take by mouth.    . Omega-3 Fatty Acids (FISH OIL) 1200 MG CPDR Take by mouth.    . pravastatin (PRAVACHOL) 40 MG tablet take 1 tablet by mouth once daily 90 tablet 1  . RA VITAMIN D-3 2000 units CAPS take 1 capsule by mouth once daily 90 capsule 3  . rOPINIRole (REQUIP) 0.25 MG tablet Take 1 tablet (0.25 mg total) by mouth at bedtime. 90 tablet 3  . valACYclovir (VALTREX) 1000 MG tablet take 2 tablets by mouth twice a day for ONE DAY FOR FLARE UP 30 tablet 2   No current facility-administered medications on file prior to visit.    ROS as in subjective    Objective: BP (!) 164/110 (BP Location: Left Arm, Patient Position: Sitting)   Pulse 96   Temp 98.2 F (36.8 C)   Wt 155 lb 3.2 oz (70.4 kg)   SpO2 95%   BMI 26.64 kg/m   BP Readings from Last 3 Encounters:  05/11/18 (!) 164/110   04/23/18 140/90  11/26/17 126/84   Wt Readings from Last 3 Encounters:  05/11/18 155 lb 3.2 oz (70.4 kg)  04/23/18 158 lb 12.8 oz (72 kg)  11/26/17 163 lb 6.4 oz (74.1 kg)    Gen: wd, wn, nad Psych: pleasant, answers questions appropriately Heart rrr, normal s1, s2, no murmurs Ext: no edema Pulses WNL   Adult ECG Report  Indication: elevated BP  Rate: 85 bpm  Rhythm: normal sinus rhythm  QRS Axis: -14 degrees  PR Interval: 124ms  QRS Duration: 177ms  QTc: 470ms  Conduction Disturbances: none  Other Abnormalities: none  Patient's cardiac risk factors are: dyslipidemia.  EKG comparison: 2015  Narrative Interpretation: no acute changes      Assessment: Encounter Diagnoses  Name Primary?  . Elevated blood-pressure reading without diagnosis of hypertension Yes  . High risk medication use   . Caregiver burden   . Need for influenza vaccination     Plan: Elevated blood pressure-reviewed EKG, discussed blood pressure complication risk, discussed treatment recommendations.  She declines to start medicine but will instead monitor her blood pressure in the next 2 weeks and get me readings ASAP.  She has had a few elevated readings in the past year or 2 but not consistent elevated readings.  Advised she not miss doses of clonidine she takes QHS for hot flashes/vasomotor symptoms  Wrote a note releasing her back to work 1 week from today  Counseled on the influenza virus vaccine.  Vaccine information sheet given.  Influenza vaccine given after consent obtained.   Aftin was seen today for other.  Diagnoses and all orders for this visit:  Elevated blood-pressure reading without diagnosis of hypertension -     EKG 12-Lead  High risk medication use  Caregiver burden  Need for influenza vaccination -     Flu Vaccine QUAD 6+ mos PF IM (Fluarix Quad PF)

## 2018-08-21 ENCOUNTER — Encounter: Payer: Self-pay | Admitting: Medical

## 2018-08-21 ENCOUNTER — Ambulatory Visit: Payer: BC Managed Care – PPO | Admitting: Medical

## 2018-08-21 ENCOUNTER — Ambulatory Visit
Admission: RE | Admit: 2018-08-21 | Discharge: 2018-08-21 | Disposition: A | Discharge status: Home / Self Care | Payer: BC Managed Care – PPO | Source: Ambulatory Visit | Attending: Medical | Admitting: Medical

## 2018-08-21 ENCOUNTER — Other Ambulatory Visit: Payer: Self-pay

## 2018-08-21 VITALS — BP 132/88 | HR 81 | Temp 97.9°F | Ht 65.0 in | Wt 157.8 lb

## 2018-08-21 DIAGNOSIS — Z23 Encounter for immunization: Secondary | ICD-10-CM

## 2018-08-21 DIAGNOSIS — M5440 Lumbago with sciatica, unspecified side: Secondary | ICD-10-CM | POA: Insufficient documentation

## 2018-08-21 DIAGNOSIS — M25511 Pain in right shoulder: Secondary | ICD-10-CM

## 2018-08-21 DIAGNOSIS — Z90710 Acquired absence of both cervix and uterus: Secondary | ICD-10-CM

## 2018-08-21 DIAGNOSIS — E559 Vitamin D deficiency, unspecified: Secondary | ICD-10-CM

## 2018-08-21 DIAGNOSIS — R232 Flushing: Secondary | ICD-10-CM

## 2018-08-21 DIAGNOSIS — Z87891 Personal history of nicotine dependence: Secondary | ICD-10-CM

## 2018-08-21 DIAGNOSIS — E785 Hyperlipidemia, unspecified: Secondary | ICD-10-CM | POA: Diagnosis not present

## 2018-08-21 DIAGNOSIS — G8929 Other chronic pain: Secondary | ICD-10-CM

## 2018-08-21 DIAGNOSIS — Z0000 Encounter for general adult medical examination without abnormal findings: Secondary | ICD-10-CM

## 2018-08-21 DIAGNOSIS — M810 Age-related osteoporosis without current pathological fracture: Secondary | ICD-10-CM

## 2018-08-21 DIAGNOSIS — N951 Menopausal and female climacteric states: Secondary | ICD-10-CM

## 2018-08-21 DIAGNOSIS — Z1211 Encounter for screening for malignant neoplasm of colon: Secondary | ICD-10-CM

## 2018-08-21 LAB — LIPID PANEL
Chol/HDL Ratio: 5 ratio — ABNORMAL HIGH (ref 0.0–4.4)
Cholesterol, Total: 211 mg/dL — ABNORMAL HIGH (ref 100–199)
HDL: 42 mg/dL (ref >39)
LDL Calculated: 150 mg/dL — ABNORMAL HIGH (ref 0–99)
Triglycerides: 97 mg/dL (ref 0–149)
VLDL Cholesterol Cal: 19 mg/dL (ref 5–40)

## 2018-08-21 LAB — COMPREHENSIVE METABOLIC PANEL
A/G RATIO: 1.5 (ref 1.2–2.2)
ALBUMIN: 4.3 g/dL (ref 3.6–4.8)
ALT: 11 IU/L (ref 0–32)
AST: 13 IU/L (ref 0–40)
Alkaline Phosphatase: 68 IU/L (ref 39–117)
BUN / CREAT RATIO: 19 (ref 12–28)
BUN: 12 mg/dL (ref 8–27)
Bilirubin Total: 0.3 mg/dL (ref 0.0–1.2)
CO2: 23 mmol/L (ref 20–29)
Calcium: 9.2 mg/dL (ref 8.7–10.3)
Chloride: 105 mmol/L (ref 96–106)
Creatinine, Ser: 0.63 mg/dL (ref 0.57–1.00)
GFR, EST AFRICAN AMERICAN: 113 mL/min/{1.73_m2} (ref >59)
GFR, EST NON AFRICAN AMERICAN: 98 mL/min/{1.73_m2} (ref >59)
GLOBULIN, TOTAL: 2.9 g/dL (ref 1.5–4.5)
Glucose: 85 mg/dL (ref 65–99)
POTASSIUM: 4.3 mmol/L (ref 3.5–5.2)
SODIUM: 143 mmol/L (ref 134–144)
Total Protein: 7.2 g/dL (ref 6.0–8.5)

## 2018-08-21 LAB — CBC
Hematocrit: 39.6 % (ref 34.0–46.6)
Hemoglobin: 13.4 g/dL (ref 11.1–15.9)
MCH: 28.1 pg (ref 26.6–33.0)
MCHC: 33.8 g/dL (ref 31.5–35.7)
MCV: 83 fL (ref 79–97)
PLATELETS: 262 10*3/uL (ref 150–450)
RBC: 4.77 x10E6/uL (ref 3.77–5.28)
RDW: 12.8 % (ref 12.3–15.4)
WBC: 6.4 10*3/uL (ref 3.4–10.8)

## 2018-08-21 LAB — POCT URINALYSIS DIP (PROADVANTAGE DEVICE)
BILIRUBIN UA: NEGATIVE
BILIRUBIN UA: NEGATIVE mg/dL
GLUCOSE UA: NEGATIVE mg/dL
LEUKOCYTES UA: NEGATIVE
Nitrite, UA: NEGATIVE
PH UA: 6 (ref 5.0–8.0)
Protein Ur, POC: NEGATIVE mg/dL
Specific Gravity, Urine: 1.025
Urobilinogen, Ur: NEGATIVE

## 2018-08-21 MED ORDER — ROPINIROLE HCL 0.25 MG PO TABS: 0.2500 mg | ORAL_TABLET | Freq: Every day | ORAL | 0 refills | Status: DC

## 2018-08-21 MED ORDER — BUPROPION HCL ER (XL) 150 MG PO TB24: 150.0000 mg | ORAL_TABLET | Freq: Every day | ORAL | 0 refills | Status: DC

## 2018-08-21 NOTE — Patient Instructions (Signed)
Thanks for trusting [place] with your health care and for coming in for a physical today.  Below are some general recommendations I have for you:  Yearly screenings See your eye doctor yearly for routine vision care. See your dentist yearly for routine dental care including hygiene visits twice yearly. See me here yearly for a routine physical and preventative care visit   Medication updates: STOP Requip/Ropinirole for restless leg since you are on other night time similar medications STOP Xanax/alprazolam   Cancer screening Colon cancer screening:  You are up to date on colonoscopy screening, but given family history and your last colonoscopy was 2014, I would consider repeating Colonoscopy now.    We will make referral.  Breast cancer screening -  Please call to schedule your mammogram at your convenience.  [facility]   [phone]          [phone], [place], [address] [place], [place] [address]        [place], [place] [address]  Osteoporosis screening/Bone Density test -you are up to date on bone density, continue Fosamax weekly for bone health along with vitamin D and both cardio and weight bearing exercise   Specific Concerns today:  .    Please follow up yearly for a physical.    I have included other useful information below for your review.  Preventative Care for Adults - Female      MAINTAIN REGULAR HEALTH EXAMS:  A routine yearly physical is a good way to check in with your primary care provider about your health and preventive screening. It is also an opportunity to share updates about your health and any concerns you have, and receive a thorough all-over exam.   Most health insurance companies pay for at least some preventative services.  Check with your health plan for specific coverages.  WHAT PREVENTATIVE SERVICES DO WOMEN NEED?  Adult women should have their  weight and blood pressure checked regularly.   Women age 60 and older should have their cholesterol levels checked regularly.  Women should be screened for cervical cancer with a Pap smear and pelvic exam beginning at either age 37, or 3 years after they become sexually activity.    Breast cancer screening generally begins at age 14 with a mammogram and breast exam by your primary care provider.    Beginning at age 18 and continuing to age 48, women should be screened for colorectal cancer.  Certain people may need continued testing until age 36.  Updating vaccinations is part of preventative care.  Vaccinations help protect against diseases such as the flu.  Osteoporosis is a disease in which the bones lose minerals and strength as we age. Women ages 21 and over should discuss this with their caregivers, as should women after menopause who have other risk factors.  Lab tests are generally done as part of preventative care to screen for anemia and blood disorders, to screen for problems with the kidneys and liver, to screen for bladder problems, to check blood sugar, and to check your cholesterol level.  Preventative services generally include counseling about diet, exercise, avoiding tobacco, drugs, excessive alcohol consumption, and sexually transmitted infections.    GENERAL RECOMMENDATIONS FOR GOOD HEALTH:  Healthy diet:  Eat a variety of foods, including fruit, vegetables, animal or vegetable protein, such as meat, fish, chicken, and eggs, or beans,  lentils, tofu, and grains, such as rice.  Drink plenty of water daily.  Decrease saturated fat in the diet, avoid lots of red meat, processed foods, sweets, fast foods, and fried foods.  Exercise:  Aerobic exercise helps maintain good heart health. At least 30-40 minutes of moderate-intensity exercise is recommended. For example, a brisk walk that increases your heart rate and breathing. This should be done on most days of the week.    Find a type of exercise or a variety of exercises that you enjoy so that it becomes a part of your daily life.  Examples are running, walking, swimming, water aerobics, and biking.  For motivation and support, explore group exercise such as aerobic class, spin class, Zumba, Yoga,or  martial arts, etc.    Set exercise goals for yourself, such as a certain weight goal, walk or run in a race such as a 5k walk/run.  Speak to your primary care provider about exercise goals.  Disease prevention:  If you smoke or chew tobacco, find out from your caregiver how to quit. It can literally save your life, no matter how long you have been a tobacco user. If you do not use tobacco, never begin.   Maintain a healthy diet and normal weight. Increased weight leads to problems with blood pressure and diabetes.   The Body Mass Index or BMI is a way of measuring how much of your body is fat. Having a BMI above 27 increases the risk of heart disease, diabetes, hypertension, stroke and other problems related to obesity. Your caregiver can help determine your BMI and based on it develop an exercise and dietary program to help you achieve or maintain this important measurement at a healthful level.  High blood pressure causes heart and blood vessel problems.  Persistent high blood pressure should be treated with medicine if weight loss and exercise do not work.   Fat and cholesterol leaves deposits in your arteries that can block them. This causes heart disease and vessel disease elsewhere in your body.  If your cholesterol is found to be high, or if you have heart disease or certain other medical conditions, then you may need to have your cholesterol monitored frequently and be treated with medication.   Ask if you should have a cardiac stress test if your history suggests this. A stress test is a test done on a treadmill that looks for heart disease. This test can find disease prior to there being a  problem.  Menopause can be associated with physical symptoms and risks. Hormone replacement therapy is available to decrease these. You should talk to your caregiver about whether starting or continuing to take hormones is right for you.   Osteoporosis is a disease in which the bones lose minerals and strength as we age. This can result in serious bone fractures. Risk of osteoporosis can be identified using a bone density scan. Women ages 56 and over should discuss this with their caregivers, as should women after menopause who have other risk factors. Ask your caregiver whether you should be taking a calcium supplement and Vitamin D, to reduce the rate of osteoporosis.   Avoid drinking alcohol in excess (more than two drinks per day).  Avoid use of street drugs. Do not share needles with anyone. Ask for professional help if you need assistance or instructions on stopping the use of alcohol, cigarettes, and/or drugs.  Brush your teeth twice a day with fluoride toothpaste, and floss once a day. Good oral  hygiene prevents tooth decay and gum disease. The problems can be painful, unattractive, and can cause other health problems. Visit your dentist for a routine oral and dental check up and preventive care every 6-12 months.   Look at your skin regularly.  Use a mirror to look at your back. Notify your caregivers of changes in moles, especially if there are changes in shapes, colors, a size larger than a pencil eraser, an irregular border, or development of new moles.  Safety:  Use seatbelts 100% of the time, whether driving or as a passenger.  Use safety devices such as hearing protection if you work in environments with loud noise or significant background noise.  Use safety glasses when doing any work that could send debris in to the eyes.  Use a helmet if you ride a bike or motorcycle.  Use appropriate safety gear for contact sports.  Talk to your caregiver about gun safety.  Use sunscreen with a  SPF (or skin protection factor) of 15 or greater.  Lighter skinned people are at a greater risk of skin cancer. Don't forget to also wear sunglasses in order to protect your eyes from too much damaging sunlight. Damaging sunlight can accelerate cataract formation.   Practice safe sex. Use condoms. Condoms are used for birth control and to help reduce the spread of sexually transmitted infections (or STIs).  Some of the STIs are gonorrhea (the clap), chlamydia, syphilis, trichomonas, herpes, HPV (human papilloma virus) and HIV (human immunodeficiency virus) which causes AIDS. The herpes, HIV and HPV are viral illnesses that have no cure. These can result in disability, cancer and death.   Keep carbon monoxide and smoke detectors in your home functioning at all times. Change the batteries every 6 months or use a model that plugs into the wall.   Vaccinations:  Stay up to date with your tetanus shots and other required immunizations. You should have a booster for tetanus every 10 years. Be sure to get your flu shot every year, since 5%-20% of the [place] population comes down with the flu. The flu vaccine changes each year, so being vaccinated once is not enough. Get your shot in the fall, before the flu season peaks.   Other vaccines to consider:  Human Papilloma Virus or HPV causes cancer of the cervix, and other infections that can be transmitted from person to person. There is a vaccine for HPV, and females should get immunized between the ages of 3 and 98. It requires a series of 3 shots.   Pneumococcal vaccine to protect against certain types of pneumonia.  This is normally recommended for adults age 44 or older.  However, adults younger than 61 years old with certain underlying conditions such as diabetes, heart or lung disease should also receive the vaccine.  Shingles vaccine to protect against Varicella Zoster if you are older than age 33, or younger than 61 years old with certain underlying  illness.  If you have not had the Shingrix vaccine, please call your insurer to inquire about coverage for the Shingrix vaccine given in 2 doses.   Some insurers cover this vaccine after age 43, some cover this after age 47.  If your insurer covers this, then call to schedule appointment to have this vaccine here  Hepatitis A vaccine to protect against a form of infection of the liver by a virus acquired from food.  Hepatitis B vaccine to protect against a form of infection of the liver by a virus acquired  from blood or body fluids, particularly if you work in health care.  If you plan to travel internationally, check with your local health department for specific vaccination recommendations.  Cancer Screening:  Breast cancer screening is essential to preventive care for women. All women age 16 and older should perform a breast self-exam every month. At age 38 and older, women should have their caregiver complete a breast exam each year. Women at ages 15 and older should have a mammogram (x-ray film) of the breasts. Your caregiver can discuss how often you need mammograms.    Cervical cancer screening includes taking a Pap smear (sample of cells examined under a microscope) from the cervix (end of the uterus). It also includes testing for HPV (Human Papilloma Virus, which can cause cervical cancer). Screening and a pelvic exam should begin at age 29, or 3 years after a woman becomes sexually active. Screening should occur every year, with a Pap smear but no HPV testing, up to age 62. After age 33, you should have a Pap smear every 3 years with HPV testing, if no HPV was found previously.   Most routine colon cancer screening begins at the age of 22. On a yearly basis, doctors may provide special easy to use take-home tests to check for hidden blood in the stool. Sigmoidoscopy or colonoscopy can detect the earliest forms of colon cancer and is life saving. These tests use a small camera at the end of a  tube to directly examine the colon. Speak to your caregiver about this at age 68, when routine screening begins (and is repeated every 5 years unless early forms of pre-cancerous polyps or small growths are found).

## 2018-08-21 NOTE — Progress Notes (Addendum)
Subjective:   HPI  Holly [name] is a 63 y.o. female who presents for Chief Complaint  Patient presents with  . CPE    fasting CPE     Medical care team includes: Keniesha Adderly, Camelia Eng, PA-C here for primary care Dentist Eye doctor  Concerns: Her last few weeks having some right shoulder pain, hurts worse if she lies on that side.  No injury, no trauma.  She does have grand babies that she holds.  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  She has had a rough couple of years with the loss of her father and brother due to cancer.  She continues to have hot flashes, using the clonidine to help with sleep and hot flashes but still has quite a few hot flashes.  Reviewed their medical, surgical, family, social, medication, and allergy history and updated chart as appropriate.  Past Medical History:  Diagnosis Date  . Chronic constipation    enrolled in a constipation study with [place] 01/2013, on Plecanatide 6mg  daily  . Diverticulosis 01/2013   on colonoscopy  . Former smoker 2018  . H/O echocardiogram 03/2014   normal, Dr. Tollie Eth  . 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 gets once to twice yearly  . Hyperlipidemia   . Hypertension    controlled off medication 2019  . Menopausal disorder   . Microscopic hematuria 04/2013   urology eval, no worrisome causes.  recheck only with frank hematuria.  . Normal cardiac stress test 03/23/14   Dr. Tollie Eth  . S/P hysterectomy    due to fibroids  . Sleep difficulties    due to menopause  . SOB (shortness of breath)    cardiac and PFT eval 03/2014  . Vitamin D deficiency   . Wears glasses     Past Surgical History:  Procedure Laterality Date  . COLONOSCOPY  01/2013   external hemorrohids, severe melanosis, diverticulosis; Dr. Deatra Ina.  prior colonoscopy Dr. Collene Mares.  . ECTOPIC PREGNANCY SURGERY  1990  . PARTIAL HYSTERECTOMY  1989   still has 1 ovary; hx/o  uterine fibroids    Social History   Socioeconomic History  . Marital status: Married    Spouse name: Not on file  . Number of children: Not on file  . Years of education: Not on file  . Highest education level: Not on file  Occupational History  . Occupation: bus Education administrator: [employer]  . Financial resource strain: Not on file  . Food insecurity:    Worry: Not on file    Inability: Not on file  . Transportation needs:    Medical: Not on file    Non-medical: Not on file  Tobacco Use  . Smoking status: Former Smoker    Packs/day: 0.30    Years: 17.00    Pack years: 5.10    Types: Cigarettes    Last attempt to quit: 07/16/2016    Years since quitting: 2.0  . Smokeless tobacco: Never Used  Substance and Sexual Activity  . Alcohol use: No  . Drug use: No  . Sexual activity: Not on file    Comment: married, 2 chlidren, school bus driver, exercise with walking  Lifestyle  . Physical activity:    Days per week: Not on file    Minutes per session: Not on file  . Stress: Not on file  Relationships  . Social connections:    Talks on phone: Not on file    Gets together: Not on file    Attends religious service: Not on file    Active member of club or organization: Not on file    Attends meetings of clubs or organizations: Not on file    Relationship status: Not on file  . Intimate partner violence:    Fear of current or ex partner: Not on file    Emotionally abused: Not on file    Physically abused: Not on file    Forced sexual activity: Not on file  Other Topics Concern  . Not on file  Social History Narrative   Bus driver, works at [employer], exercises with walking, widowed 2019, has 2 children, ages 66yo and 71yo, both live in [place], 4 grandchildren.  08/2018    Family History  Problem Relation Age of Onset  . Hyperlipidemia Mother   . Hypertension Mother   . Diabetes Mother   . Hyperlipidemia Father   . Hypertension  Father   . Diabetes Father   . Stroke Brother        during surgery  . Cancer Neg Hx   . Heart disease Neg Hx      Current Outpatient Medications:  .  acetaminophen (TYLENOL) 500 MG tablet, Take 500 mg by mouth every 6 (six) hours as needed., Disp: , Rfl:  .  albuterol (PROVENTIL HFA;VENTOLIN HFA) 108 (90 Base) MCG/ACT inhaler, Inhale 2 puffs into the lungs every 6 (six) hours as needed for wheezing or shortness of breath., Disp: 1 Inhaler, Rfl: 2 .  alendronate (FOSAMAX) 70 MG tablet, take 1 tablet by mouth every week TAKE WITH A FULL GLASS OF WATER ON AN EMPTY STOMACH, Disp: 12 tablet, Rfl: 3 .  calcium-vitamin D (OSCAL-500) 500-400 MG-UNIT per tablet, Take 1 tablet by mouth 2 (two) times daily., Disp: 180 tablet, Rfl: 3 .  cloNIDine (CATAPRES) 0.1 MG tablet, Take 1 tablet (0.1 mg total) by mouth daily., Disp: 90 tablet, Rfl: 3 .  fluticasone (FLONASE) 50 MCG/ACT nasal spray, Place 1 spray into both nostrils 2 (two) times daily., Disp: 1 g, Rfl: 11 .  loratadine (CLARITIN) 10 MG tablet, Take 10 mg by mouth daily., Disp: , Rfl:  .  Naproxen Sod-Diphenhydramine (ALEVE PM PO), Take by mouth., Disp: , Rfl:  .  RA VITAMIN D-3 2000 units CAPS, take 1 capsule by mouth once daily, Disp: 90 capsule, Rfl: 3 .  valACYclovir (VALTREX) 1000 MG tablet, take 2 tablets by mouth twice a day for ONE DAY FOR FLARE UP, Disp: 30 tablet, Rfl: 2 .  ALPRAZolam (XANAX) 0.5 MG tablet, Take 1 tablet (0.5 mg total) by mouth 3 (three) times daily as needed for sleep or anxiety. (Patient not taking: Reported on 08/21/2018), Disp: 20 tablet, Rfl: 0  Allergies  Allergen Reactions  . Aspirin     Upsets her stomach taking daily  . Penicillins Rash     Review of Systems Constitutional: -fever, -chills, -sweats, -unexpected weight change, -decreased appetite, -fatigue Allergy: -sneezing, -itching, -congestion Dermatology: -changing moles, --rash, -lumps ENT: -runny nose, -ear pain, -sore throat, -hoarseness, -sinus  pain, -teeth pain, - ringing in ears, -hearing loss, -nosebleeds Cardiology: -chest pain, -palpitations, -swelling, -difficulty breathing when lying flat, -waking up short of breath Respiratory: -cough, -shortness of breath, -difficulty breathing with exercise or exertion, -wheezing, -coughing up blood Gastroenterology: -abdominal pain, -nausea, -vomiting, -diarrhea, -constipation, -blood in stool, -changes in bowel movement, -difficulty  swallowing or eating Hematology: -bleeding, -bruising  Musculoskeletal: +joint aches, -muscle aches, -joint swelling, -back pain, -neck pain, -cramping, -changes in gait Ophthalmology: denies vision changes, eye redness, itching, discharge Urology: -burning with urination, -difficulty urinating, -blood in urine, -urinary frequency, -urgency, -incontinence Neurology: -headache, -weakness, -tingling, -numbness, -memory loss, -falls, -dizziness Psychology: -depressed mood, -agitation, -sleep problems Breast/gyn: -breast tenderness, -discharge, -lumps, -vaginal discharge,- irregular periods, -heavy periods     Objective:  BP 132/88 (BP Location: Left Arm, Patient Position: Sitting, Cuff Size: Normal)   Pulse 81   Temp 97.9 F (36.6 C) (Oral)   Ht 5\' 5"  (1.651 m)   Wt 157 lb 12.8 oz (71.6 kg)   SpO2 98%   BMI 26.26 kg/m   General appearance: alert, no distress, WD/WN, African American female Skin: no worrisome findings HEENT: normocephalic, conjunctiva/corneas normal, sclerae anicteric, PERRLA, EOMi, nares patent, no discharge or erythema, pharynx normal Oral cavity: MMM, tongue normal, teeth normal Neck: supple, no lymphadenopathy, no thyromegaly, no masses, normal ROM, no bruits Chest: non tender, normal shape and expansion Heart: RRR, normal S1, S2, no murmurs Lungs: CTA bilaterally, no wheezes, rhonchi, or rales Abdomen: +bs, soft, non tender, non distended, no masses, no hepatomegaly, no splenomegaly, no bruits Back: non tender, normal ROM, no  scoliosis Musculoskeletal: +mild pain with shoulder ROM in general, but shoulder exam on right normal, otherwise upper extremities non tender, no obvious deformity, normal ROM throughout, lower extremities non tender, no obvious deformity, normal ROM throughout Extremities: no edema, no cyanosis, no clubbing Pulses: 2+ symmetric, upper and lower extremities, normal cap refill Neurological: alert, oriented x 3, CN2-12 intact, strength normal upper extremities and lower extremities, sensation normal throughout, DTRs 2+ throughout, no cerebellar signs, gait normal Psychiatric: normal affect, behavior normal, pleasant  Breast/gyn/rectal - declined    Assessment and Plan :   Encounter Diagnoses  Name Primary?  . Routine general medical examination at a health care facility Yes  . Osteoporosis, unspecified osteoporosis type, unspecified pathological fracture presence   . Former smoker   . Hyperlipidemia, unspecified hyperlipidemia type   . Menopausal state   . S/P hysterectomy   . Vitamin D deficiency   . Chronic right shoulder pain   . Hot flashes   . Need for shingles vaccine     Physical exam - discussed and counseled on healthy lifestyle, diet, exercise, preventative care, vaccinations, sick and well care, proper use of emergency dept and after hours care, and addressed their concerns.    Health screening: Advised they see their eye doctor yearly for routine vision care. Advised they see their dentist yearly for routine dental care including hygiene visits twice yearly.   Cancer screening Counseled on self breast exams, mammograms, cervical cancer screening Referral for updated colonoscopy given family history, and twin brother with colon cancer S/p hysterectomy  Vaccinations: Advised yearly influenza vaccine Counseled on the Shingrix vaccine.  Vaccine information sheet given. Shingrix #2 vaccine given after consent obtained.     Acute issues discussed: Shoulder pain chronic -  go for xray, exam normal other than pain with ROM  Separate significant chronic issues discussed: Osteoporosis- counseled on weightbearing exercise, aerobic exercise, continue Fosamax, reviewed the recent bone density scan in chart record  Hyperlipidemia-not currently taking statin, labs today  Vitamin D deficiency-continue supplements  Hot flashes- consider increasing clonidine  After reviewing medications we discontinued alprazolam, Requip, and Wellbutrin that she was no longer taking   Margee was seen today for cpe.  Diagnoses and all orders for  this visit:  Routine general medical examination at a health care facility -     POCT Urinalysis DIP (Proadvantage Device) -     Comprehensive metabolic panel -     CBC -     Lipid panel  Osteoporosis, unspecified osteoporosis type, unspecified pathological fracture presence  Former smoker  Hyperlipidemia, unspecified hyperlipidemia type  Menopausal state  S/P hysterectomy  Vitamin D deficiency  Chronic right shoulder pain -     DG Shoulder Right; Future  Hot flashes  Need for shingles vaccine -     Varicella-zoster vaccine IM (Shingrix)  Other orders -     Discontinue: buPROPion (WELLBUTRIN XL) 150 MG 24 hr tablet; Take 1 tablet (150 mg total) by mouth daily. -     Discontinue: rOPINIRole (REQUIP) 0.25 MG tablet; Take 1 tablet (0.25 mg total) by mouth at bedtime.   Follow-up pending labs, yearly for physical

## 2018-08-24 ENCOUNTER — Other Ambulatory Visit: Payer: Self-pay | Admitting: Medical

## 2018-08-24 DIAGNOSIS — J4521 Mild intermittent asthma with (acute) exacerbation: Secondary | ICD-10-CM

## 2018-08-24 MED ORDER — CLONIDINE HCL 0.1 MG PO TABS: 0.1000 mg | ORAL_TABLET | Freq: Every day | ORAL | 3 refills | Status: DC

## 2018-08-24 MED ORDER — ALENDRONATE SODIUM 70 MG PO TABS: ORAL_TABLET | ORAL | 3 refills | Status: DC

## 2018-08-24 MED ORDER — ALBUTEROL SULFATE HFA 108 (90 BASE) MCG/ACT IN AERS: 2.0000 | INHALATION_SPRAY | Freq: Four times a day (QID) | RESPIRATORY_TRACT | 2 refills | Status: DC | PRN

## 2018-08-25 ENCOUNTER — Other Ambulatory Visit: Payer: Self-pay | Admitting: Medical

## 2018-08-25 ENCOUNTER — Encounter: Payer: Self-pay | Admitting: Medical

## 2018-08-25 MED ORDER — ROSUVASTATIN CALCIUM 5 MG PO TABS: 5.0000 mg | ORAL_TABLET | Freq: Every day | ORAL | 2 refills | Status: DC

## 2018-08-25 MED ORDER — FLUTICASONE PROPIONATE 50 MCG/ACT NA SUSP: 1.0000 | Freq: Two times a day (BID) | NASAL | 11 refills | Status: DC

## 2018-08-25 NOTE — Telephone Encounter (Signed)
Is this ok to refill?  

## 2018-11-11 ENCOUNTER — Other Ambulatory Visit: Payer: Self-pay | Admitting: Medical

## 2018-11-17 ENCOUNTER — Other Ambulatory Visit: Payer: Self-pay | Admitting: Medical

## 2018-12-26 ENCOUNTER — Other Ambulatory Visit: Payer: Self-pay | Admitting: Medical

## 2018-12-28 NOTE — Telephone Encounter (Signed)
Is this ok to refill?  

## 2019-02-20 ENCOUNTER — Other Ambulatory Visit: Payer: Self-pay | Admitting: Medical

## 2019-02-22 ENCOUNTER — Other Ambulatory Visit: Payer: Self-pay | Admitting: Medical

## 2019-02-22 MED ORDER — ROSUVASTATIN CALCIUM 5 MG PO TABS: ORAL_TABLET | ORAL | 0 refills | Status: DC

## 2019-02-22 NOTE — Telephone Encounter (Signed)
Is this appropriate?  

## 2019-02-22 NOTE — Telephone Encounter (Signed)
Get in for fasting med check on Crestor. I'll send refill

## 2019-03-02 LAB — HM MAMMOGRAPHY

## 2019-03-03 ENCOUNTER — Telehealth: Payer: Self-pay | Admitting: Medical

## 2019-03-03 NOTE — Telephone Encounter (Signed)
I am happy to report that her mammogram was normal, no worrisome findings.

## 2019-03-03 NOTE — Telephone Encounter (Signed)
Pt was informed of her results

## 2019-03-04 ENCOUNTER — Encounter: Payer: Self-pay | Admitting: Medical

## 2019-03-25 ENCOUNTER — Other Ambulatory Visit: Payer: Self-pay | Admitting: Medical

## 2019-04-19 ENCOUNTER — Other Ambulatory Visit: Payer: Self-pay | Admitting: Medical

## 2019-06-18 ENCOUNTER — Other Ambulatory Visit: Payer: Self-pay | Admitting: Medical

## 2019-08-20 ENCOUNTER — Other Ambulatory Visit: Payer: Self-pay | Admitting: Medical

## 2019-08-23 ENCOUNTER — Encounter: Payer: Self-pay | Admitting: Medical

## 2019-08-23 ENCOUNTER — Ambulatory Visit (INDEPENDENT_AMBULATORY_CARE_PROVIDER_SITE_OTHER): Payer: BC Managed Care – PPO | Admitting: Medical

## 2019-08-23 ENCOUNTER — Other Ambulatory Visit: Payer: Self-pay

## 2019-08-23 VITALS — BP 120/86 | HR 72 | Temp 97.8°F | Ht 65.0 in | Wt 172.6 lb

## 2019-08-23 DIAGNOSIS — Z79899 Other long term (current) drug therapy: Secondary | ICD-10-CM

## 2019-08-23 DIAGNOSIS — Z800 Family history of malignant neoplasm of digestive organs: Secondary | ICD-10-CM | POA: Insufficient documentation

## 2019-08-23 DIAGNOSIS — E2839 Other primary ovarian failure: Secondary | ICD-10-CM

## 2019-08-23 DIAGNOSIS — M810 Age-related osteoporosis without current pathological fracture: Secondary | ICD-10-CM

## 2019-08-23 DIAGNOSIS — R3129 Other microscopic hematuria: Secondary | ICD-10-CM

## 2019-08-23 DIAGNOSIS — Z90710 Acquired absence of both cervix and uterus: Secondary | ICD-10-CM

## 2019-08-23 DIAGNOSIS — E559 Vitamin D deficiency, unspecified: Secondary | ICD-10-CM | POA: Diagnosis not present

## 2019-08-23 DIAGNOSIS — Z0000 Encounter for general adult medical examination without abnormal findings: Secondary | ICD-10-CM | POA: Diagnosis not present

## 2019-08-23 DIAGNOSIS — N951 Menopausal and female climacteric states: Secondary | ICD-10-CM

## 2019-08-23 DIAGNOSIS — Z87891 Personal history of nicotine dependence: Secondary | ICD-10-CM

## 2019-08-23 DIAGNOSIS — Z1211 Encounter for screening for malignant neoplasm of colon: Secondary | ICD-10-CM

## 2019-08-23 DIAGNOSIS — E785 Hyperlipidemia, unspecified: Secondary | ICD-10-CM

## 2019-08-23 LAB — POCT URINALYSIS DIP (PROADVANTAGE DEVICE)
Bilirubin, UA: NEGATIVE
Glucose, UA: NEGATIVE mg/dL
Ketones, POC UA: NEGATIVE mg/dL
Leukocytes, UA: NEGATIVE
Nitrite, UA: NEGATIVE
Protein Ur, POC: NEGATIVE mg/dL
Specific Gravity, Urine: 1.02
Urobilinogen, Ur: NEGATIVE
pH, UA: 6.5 (ref 5.0–8.0)

## 2019-08-23 MED ORDER — CLONIDINE HCL 0.1 MG PO TABS: 0.1000 mg | ORAL_TABLET | Freq: Every day | ORAL | 3 refills | Status: DC

## 2019-08-23 NOTE — Patient Instructions (Signed)
Preventative Care for Adults - Female   Thank you for coming in for your well visit today, and thank you for trusting [place] with your care!   Maintain regular health and wellness exams:  A routine yearly physical is a good way to check in with your primary care provider about your health and preventive screening. It is also an opportunity to share updates about your health and any concerns you have, and receive a thorough all-over exam.   Most health insurance companies pay for at least some preventative services.  Check with your health plan for specific coverages.  What preventative services do women need?  Adult women should have their weight and blood pressure checked regularly.   Women age 23 and older should have their cholesterol levels checked regularly.  Women should be screened for cervical cancer with a Pap smear and pelvic exam beginning at either age 46, or 3 years after they become sexually activity.    Breast cancer screening generally begins at age 80 with a mammogram and breast exam by your primary care provider.    Beginning at age 63 and continuing to age 76, women should be screened for colorectal cancer.  Certain people may need continued testing until age 64.  Updating vaccinations is part of preventative care.  Vaccinations help protect against diseases such as the flu.  Osteoporosis is a disease in which the bones lose minerals and strength as we age. Women ages 53 and over should discuss this with their caregivers, as should women after menopause who have other risk factors.  Lab tests are generally done as part of preventative care to screen for anemia and blood disorders, to screen for problems with the kidneys and liver, to screen for bladder problems, to check blood sugar, and to check your cholesterol level.  Preventative services generally include counseling about diet, exercise, avoiding tobacco, drugs, excessive alcohol consumption, and sexually transmitted  infections.    Xrays and CT scans are not normally done as a preventative test, and most insurances do not pay for imaging for screening other than as discussed under cancer screens below.   On the other hand, if you have certain medical concerns, imaging may be necessary as a diagnostic test.   Your Medical Team Your medical team starts with [place], your PCP or primary care provider.  Please use our services for your routine care such as physicals, screenings, immunizations, sick visits, and your first stop for general medical concerns.  You can call our number for after hours information for urgent questions that may need attention but cannot wait til the next business day.    Urgent care-urgent cares exist to provide care when your primary care office would typically be closed such as evenings or weekends.   Urgent care is for evaluation of urgent medical problems that do not necessarily require emergency department care, but cannot wait til the next business day when we are open.  Emergency department care-please reserve emergency department care for serious, urgent, possibly life-threatening medical problems.  This includes issues like possible stroke, heart attack, significant injury, mental health crisis, or other urgent need that requires immediate medical attention.     See your dentist office twice yearly for hygiene and cleaning visits.   Brush your teeth and floss your teeth daily.  See your eye doctor yearly for routine eye exam and screenings for glaucoma and retinal disease.  See your gynecologist yearly if you do not have your female/gynecological exams at our  office.      Vaccines:  Stay up to date with your tetanus shots and other required immunizations. You should have a booster for tetanus every 10 years. Be sure to get your flu shot every year, since 5%-20% of the [place] population comes down with the flu. The flu vaccine changes each year, so being vaccinated once is not enough.  Get your shot in the fall, before the flu season peaks.   Other vaccines to consider:  Pneumococcal vaccine to protect against certain types of pneumonia.  This is normally recommended for adults age 10 or older.  However, adults younger than 62 years old with certain underlying conditions such as diabetes, heart or lung disease should also receive the vaccine.  Shingles vaccine to protect against Varicella Zoster if you are older than age 54, or younger than 62 years old with certain underlying illness.  If you have not had the Shingrix vaccine, please call your insurer to inquire about coverage for the Shingrix vaccine given in 2 doses.   Some insurers cover this vaccine after age 11, some cover this after age 39.  If your insurer covers this, then call to schedule appointment to have this vaccine here  Hepatitis A vaccine to protect against a form of infection of the liver by a virus acquired from food.  Hepatitis B vaccine to protect against a form of infection of the liver by a virus acquired from blood or body fluids, particularly if you work in health care.  If you plan to travel internationally, check with your local health department for specific vaccination recommendations.  Human Papilloma Virus or HPV causes cancer of the cervix, and other infections that can be transmitted from person to person. There is a vaccine for HPV, and males should get immunized between the ages of 24 and 82. It requires a series of 3 shots.   Covid/Coronavirus - as the vaccines are becoming available, please consider vaccination if you are a health care worker, first responder, or have significant health problems such as asthma, COPD, heart disease, hypertension, diabetes, obesity, multiple medical problems, over age 85yo, or immunocompromised.       What should I know about Cancer screening? Many types of cancers can be detected early and may often be prevented. Lung Cancer  You should be screened every  year for lung cancer if: ? You are a current smoker who has smoked for at least 30 years. ? You are a former smoker who has quit within the past 15 years.  Talk to your health care provider about your screening options, when you should start screening, and how often you should be screened.  Breast cancer screening is essential to preventive care for women. All women age 7 and older should perform a breast self-exam every month. At age 7 and older, women should have their caregiver complete a breast exam each year. Women at ages 18 and older should have a mammogram (x-ray film) of the breasts. Your caregiver can discuss how often you need mammograms.    Breast cancer screening   [facility]   [phone]         [phone] [address], [place], [address] [place], [place] [address]        [place],  [id number]  Cervical cancer screening includes taking a Pap smear (sample of cells examined under a  microscope) from the cervix (end of the uterus). It also includes testing for HPV (Human Papilloma Virus, which can cause cervical cancer). Screening and a pelvic exam should begin at age 44, or 3 years after a woman becomes sexually active. Screening should occur every year, with a Pap smear but no HPV testing, up to age 62. After age 38, you should have a Pap smear every 3 years with HPV testing, if no HPV was found previously.   Colorectal Cancer  Routine colorectal cancer screening usually begins at 62 years of age and should be repeated every 5-10 years until you are 62 years old. You may need to be screened more often if early forms of precancerous polyps or small growths are found. Your health care provider may recommend screening at an earlier age if you have risk factors for colon cancer.  Your health care provider may recommend using home test kits to check for hidden blood in the stool.  A small camera  at the end of a tube can be used to examine your colon (sigmoidoscopy or colonoscopy). This checks for the earliest forms of colorectal cancer.  Skin Cancer  Check your skin from head to toe regularly.  Tell your health care provider about any new moles or changes in moles, especially if: ? There is a change in a mole's size, shape, or color. ? You have a mole that is larger than a pencil eraser.  Always use sunscreen. Apply sunscreen liberally and repeat throughout the day.  Protect yourself by wearing long sleeves, pants, a wide-brimmed hat, and sunglasses when outside.   Are you up to date on cancer screenings:  COLON CANCER SCREENING:  Not up to date.  We will refer  BREAST CANCER SCREENING:  yearly  OVARIAN CANCER SCREENING:  since there are no swabs for this screening, a clinical pelvic exam is needed for screening.  It is recommended to have this done yearly or regularly as part of your exam.     GENERAL RECOMMENDATIONS FOR GOOD HEALTH:  Healthy diet:  Eat a variety of foods, including fruit, vegetables, animal or vegetable protein, such as meat, fish, chicken, and eggs, or beans, lentils, tofu, and grains, such as rice.  Drink plenty of water daily.  Decrease saturated fat in the diet, avoid lots of red meat, processed foods, sweets, fast foods, and fried foods.  Exercise:  Aerobic exercise helps maintain good heart health. At least 30-40 minutes of moderate-intensity exercise is recommended. For example, a brisk walk that increases your heart rate and breathing. This should be done on most days of the week.   Find a type of exercise or a variety of exercises that you enjoy so that it becomes a part of your daily life.  Examples are running, walking, swimming, water aerobics, and biking.  For motivation and support, explore group exercise such as aerobic class, spin class, Zumba, Yoga,or  martial arts, etc.    Set exercise goals for yourself, such as a certain weight  goal, walk or run in a race such as a 5k walk/run.  Speak to your primary care provider about exercise goals.  Your weight readings per our records: Wt Readings from Last 3 Encounters:  08/21/18 157 lb 12.8 oz (71.6 kg)  05/11/18 155 lb 3.2 oz (70.4 kg)  04/23/18 158 lb 12.8 oz (72 kg)    There is no height or weight on file to calculate BMI.    Disease prevention:  If you smoke or  chew tobacco, find out from your caregiver how to quit. It can literally save your life, no matter how long you have been a tobacco user. If you do not use tobacco, never begin.   Maintain a healthy diet and normal weight. Increased weight leads to problems with blood pressure and diabetes.   The Body Mass Index or BMI is a way of measuring how much of your body is fat. Having a BMI above 27 increases the risk of heart disease, diabetes, hypertension, stroke and other problems related to obesity. Your caregiver can help determine your BMI and based on it develop an exercise and dietary program to help you achieve or maintain this important measurement at a healthful level.  High blood pressure causes heart and blood vessel problems.  Persistent high blood pressure should be treated with medicine if weight loss and exercise do not work.  Your blood pressure readings per our records:     BP Readings from Last 3 Encounters:  08/21/18 132/88  05/11/18 (!) 164/110  04/23/18 140/90     Fat and cholesterol leaves deposits in your arteries that can block them. This causes heart disease and vessel disease elsewhere in your body.  If your cholesterol is found to be high, or if you have heart disease or certain other medical conditions, then you may need to have your cholesterol monitored frequently and be treated with medication.   Ask if you should have a cardiac stress test if your history suggests this. A stress test is a test done on a treadmill that looks for heart disease. This test can find disease prior to  there being a problem.    Heart disease screening:  EKG done 2019    Menopause can be associated with physical symptoms and risks. Hormone replacement therapy is available to decrease these. You should talk to your caregiver about whether starting or continuing to take hormones is right for you.   Osteoporosis is a disease in which the bones lose minerals and strength as we age. This can result in serious bone fractures. Risk of osteoporosis can be identified using a bone density scan. Women ages 48 and over should discuss this with their caregivers, as should women after menopause who have other risk factors. Ask your caregiver whether you should be taking a calcium supplement and Vitamin D, to reduce the rate of osteoporosis.   Osteoporosis screening/bone density testing:  [facility]   [phone]          [phone] [address] [place], [address] [place], [place] [address]        [place], [place] [address]    Avoid drinking alcohol in excess (more than two drinks per day).  Avoid use of street drugs. Do not share needles with anyone. Ask for professional help if you need assistance or instructions on stopping the use of alcohol, cigarettes, and/or drugs.  Brush your teeth twice a day with fluoride toothpaste, and floss once a day. Good oral hygiene prevents tooth decay and gum disease. The problems can be painful, unattractive, and can cause other health problems. Visit your dentist for a routine oral and dental check up and preventive care every 6-12 months.   Safety:  Use seatbelts 100% of the time, whether driving or as a passenger.  Use safety devices such as hearing protection if you work in environments with  loud noise or significant background noise.  Use safety glasses when doing any work that could send debris in to the eyes.  Use a helmet if you ride a bike or motorcycle.  Use appropriate safety  gear for contact sports.  Talk to your caregiver about gun safety.  Use sunscreen with a SPF (or skin protection factor) of 15 or greater.  Lighter skinned people are at a greater risk of skin cancer. Don't forget to also wear sunglasses in order to protect your eyes from too much damaging sunlight. Damaging sunlight can accelerate cataract formation.   Keep carbon monoxide and smoke detectors in your home functioning at all times. Change the batteries every 6 months or use a model that plugs into the wall.    Sexual activity: . Sex is a normal part of life and sexual activity can continue into older adulthood for many healthy people.   . If you are having issues related to sexual activity, please follow up to discuss this further.   . If you are not in a monogamous relationship or have more than one partner, please practice safe sex.  Use condoms. Condoms are used for birth control and to help reduce the spread of sexually transmitted infections (or STIs).  Some of the STIs are gonorrhea (the clap), chlamydia, syphilis, trichomonas, herpes, HPV (human papilloma virus) and HIV (human immunodeficiency virus) which causes AIDS. The herpes, HIV and HPV are viral illnesses that have no cure. These can result in disability, cancer and death.   We are able to test for STIs here at our office.

## 2019-08-23 NOTE — Addendum Note (Signed)
Addended by: Carlena Hurl on: 08/23/2019 03:44 PM   Modules accepted: Orders

## 2019-08-23 NOTE — Addendum Note (Signed)
Addended by: Edgar Frisk on: 08/23/2019 01:08 PM   Modules accepted: Orders

## 2019-08-23 NOTE — Progress Notes (Addendum)
Subjective:   HPI  Holly [name] is a 64 y.o. female who presents for Chief Complaint  Patient presents with  . Annual Exam    with fasting labs     Medical care team includes: Bodi Palmeri, Camelia Eng, PA-C here for primary care Dentist Eye doctor Formerly Dr. Erskine Emery, GI  Concerns: Concerns about brother.  Brother who has had strokes and seizures just recently has been evaluated for dementia.  Needs clonidine refilled, uses for hot flashes.  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  Compliant with statin without c/o.  Compliant with fosamax for about 2 years now.    No other new problems other than weight gain with not active as much this year with covid.  Reviewed their medical, surgical, family, social, medication, and allergy history and updated chart as appropriate.  Past Medical History:  Diagnosis Date  . Chronic constipation    enrolled in a constipation study with [facility] 01/2013, on Plecanatide 6mg  daily  . Diverticulosis 01/2013   on colonoscopy  . Former smoker 2018  . H/O echocardiogram 03/2014   normal, Dr. Tollie Eth  . 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 gets once to twice yearly  . Hyperlipidemia   . Hypertension    controlled off medication 2019  . Menopausal disorder   . Microscopic hematuria 04/2013   urology eval, no worrisome causes.  recheck only with frank hematuria.  . Normal cardiac stress test 03/23/14   Dr. Tollie Eth  . S/P hysterectomy    due to fibroids  . Sleep difficulties    due to menopause  . SOB (shortness of breath)    cardiac and PFT eval 03/2014  . Vitamin D deficiency   . Wears glasses     Past Surgical History:  Procedure Laterality Date  . COLONOSCOPY  01/2013   external hemorrohids, severe melanosis, diverticulosis; Dr. Deatra Ina.  prior colonoscopy Dr. Collene Mares.  . ECTOPIC PREGNANCY SURGERY  1990  .  PARTIAL HYSTERECTOMY  1989   still has 1 ovary; hx/o uterine fibroids    Social History   Socioeconomic History  . Marital status: Widowed    Spouse name: Not on file  . Number of children: Not on file  . Years of education: Not on file  . Highest education level: Not on file  Occupational History  . Occupation: bus Education administrator: [employer]  Tobacco Use  . Smoking status: Former Smoker    Packs/day: 0.30    Years: 17.00    Pack years: 5.10    Types: Cigarettes    Quit date: 07/16/2016    Years since quitting: 3.1  . Smokeless tobacco: Never Used  Substance and Sexual Activity  . Alcohol use: No  . Drug use: No  . Sexual activity: Not on file    Comment: married, 2 chlidren, school bus driver, exercise with walking  Other Topics Concern  . Not on file  Social History Narrative   Bus driver, works at [employer], exercises with walking, widowed 2019, has 2 children, ages 57yo and 37yo, both live in [place], 4 grandchildren.  08/2018   Social Determinants of Health   Financial Resource Strain:   . Difficulty of Paying Living Expenses: Not on file  Food Insecurity:   . Worried About Charity fundraiser in the Last Year:  Not on file  . Ran Out of Food in the Last Year: Not on file  Transportation Needs:   . Lack of Transportation (Medical): Not on file  . Lack of Transportation (Non-Medical): Not on file  Physical Activity:   . Days of Exercise per Week: Not on file  . Minutes of Exercise per Session: Not on file  Stress:   . Feeling of Stress : Not on file  Social Connections:   . Frequency of Communication with Friends and Family: Not on file  . Frequency of Social Gatherings with Friends and Family: Not on file  . Attends Religious Services: Not on file  . Active Member of Clubs or Organizations: Not on file  . Attends Archivist Meetings: Not on file  . Marital Status: Not on file  Intimate Partner Violence:   . Fear of Current  or Ex-Partner: Not on file  . Emotionally Abused: Not on file  . Physically Abused: Not on file  . Sexually Abused: Not on file    Family History  Problem Relation Age of Onset  . Hyperlipidemia Mother   . Hypertension Mother   . Diabetes Mother   . Dementia Mother   . Hyperlipidemia Father   . Hypertension Father   . Diabetes Father   . Cancer Father   . Stroke Brother        during surgery  . Dementia Brother   . Seizures Brother   . Cancer Brother 1       colon  . Heart disease Neg Hx      Current Outpatient Medications:  .  alendronate (FOSAMAX) 70 MG tablet, take 1 tablet by mouth every week TAKE WITH A FULL GLASS OF WATER ON AN EMPTY STOMACH, Disp: 12 tablet, Rfl: 3 .  calcium carbonate (CALCIUM 600) 600 MG TABS tablet, Take 600 mg by mouth daily with breakfast., Disp: , Rfl:  .  cloNIDine (CATAPRES) 0.1 MG tablet, Take 1 tablet (0.1 mg total) by mouth daily., Disp: 90 tablet, Rfl: 3 .  RA VITAMIN D-3 2000 units CAPS, take 1 capsule by mouth once daily, Disp: 90 capsule, Rfl: 3 .  rosuvastatin (CRESTOR) 5 MG tablet, TAKE 1 TABLET(5 MG) BY MOUTH AT BEDTIME, Disp: 30 tablet, Rfl: 2 .  valACYclovir (VALTREX) 1000 MG tablet, TAKE 2 TABLETS BY MOUTH TWICE DAILY (Patient taking differently: 3 times/day as needed-between meals & bedtime. ), Disp: 30 tablet, Rfl: 0 .  fluticasone (FLONASE) 50 MCG/ACT nasal spray, INSTILL 1 SPRAY INTO EACH NOSTRIL TWICE A DAY (Patient not taking: Reported on 08/23/2019), Disp: 16 g, Rfl: 2 .  loratadine (CLARITIN) 10 MG tablet, Take 10 mg by mouth daily., Disp: , Rfl:  .  Naproxen Sod-Diphenhydramine (ALEVE PM PO), Take by mouth., Disp: , Rfl:   Allergies  Allergen Reactions  . Aspirin     Upsets her stomach taking daily  . Pravachol [Pravastatin Sodium]     Leg aches  . Penicillins Rash     Review of Systems Constitutional: -fever, -chills, -sweats, -unexpected weight change, -decreased appetite, -fatigue Allergy: -sneezing, -itching,  -congestion Dermatology: -changing moles, --rash, -lumps ENT: -runny nose, -ear pain, -sore throat, -hoarseness, -sinus pain, -teeth pain, - ringing in ears, -hearing loss, -nosebleeds Cardiology: -chest pain, -palpitations, -swelling, -difficulty breathing when lying flat, -waking up short of breath Respiratory: -cough, -shortness of breath, -difficulty breathing with exercise or exertion, -wheezing, -coughing up blood Gastroenterology: -abdominal pain, -nausea, -vomiting, -diarrhea, -constipation, -blood in stool, -changes in bowel  movement, -difficulty swallowing or eating Hematology: -bleeding, -bruising  Musculoskeletal: -joint aches, -muscle aches, -joint swelling, -back pain, -neck pain, -cramping, -changes in gait Ophthalmology: denies vision changes, eye redness, itching, discharge Urology: -burning with urination, -difficulty urinating, -blood in urine, -urinary frequency, -urgency, -incontinence Neurology: -headache, -weakness, -tingling, -numbness, -memory loss, -falls, -dizziness Psychology: -depressed mood, -agitation, -sleep problems Breast/gyn: -breast tenderness, -discharge, -lumps, -vaginal discharge,- irregular periods, -heavy periods     Objective:  BP 120/86   Pulse 72   Temp 97.8 F (36.6 C)   Ht 5\' 5"  (1.651 m)   Wt 172 lb 9.6 oz (78.3 kg)   SpO2 97%   BMI 28.72 kg/m   General appearance: alert, no distress, WD/WN, African American female Skin: no worrisome findings HEENT: normocephalic, conjunctiva/corneas normal, sclerae anicteric, PERRLA, EOMi, nares patent, no discharge or erythema, pharynx normal Oral cavity: MMM, tongue normal, teeth normal Neck: supple, no lymphadenopathy, no thyromegaly, no masses, normal ROM, no bruits Chest: non tender, normal shape and expansion Heart: RRR, normal S1, S2, no murmurs Lungs: CTA bilaterally, no wheezes, rhonchi, or rales Abdomen: +bs, soft, non tender, non distended, no masses, no hepatomegaly, no splenomegaly, no  bruits Back: non tender, normal ROM, no scoliosis Musculoskeletal: +mild pain with shoulder ROM in general, but shoulder exam on right normal, otherwise upper extremities non tender, no obvious deformity, normal ROM throughout, lower extremities non tender, no obvious deformity, normal ROM throughout Extremities: no edema, no cyanosis, no clubbing Pulses: 2+ symmetric, upper and lower extremities, normal cap refill Neurological: alert, oriented x 3, CN2-12 intact, strength normal upper extremities and lower extremities, sensation normal throughout, DTRs 2+ throughout, no cerebellar signs, gait normal Psychiatric: normal affect, behavior normal, pleasant   Breast: nontender, no masses or lumps, no skin changes, no nipple discharge or inversion, no axillary lymphadenopathy Gyn: Normal external genitalia without lesions, vagina with normal mucosa, no abnormal vaginal discharge.  Adnexa not enlarged, nontender, no masses.  S/p hysterectomy.  Exam chaperoned by nurse. Rectal: anus normal appearing   Assessment and Plan :   Encounter Diagnoses  Name Primary?  . Encounter for health maintenance examination in adult Yes  . S/P hysterectomy   . Vitamin D deficiency   . Osteoporosis, unspecified osteoporosis type, unspecified pathological fracture presence   . Estrogen deficiency   . High risk medication use   . Hyperlipidemia, unspecified hyperlipidemia type   . Menopausal state   . Former smoker   . Screen for colon cancer   . Family history of colon cancer   . Microscopic hematuria     Physical exam - discussed and counseled on healthy lifestyle, diet, exercise, preventative care, vaccinations, sick and well care, proper use of emergency dept and after hours care, and addressed their concerns.    Health screening: Advised they see their eye doctor yearly for routine vision care. Advised they see their dentist yearly for routine dental care including hygiene visits twice yearly.   Cancer  screening Counseled on self breast exams, mammograms, cervical cancer screening Referral for updated colonoscopy given family history, and twin brother with colon cancer S/p hysterectomy  Vaccinations: Up to date on vaccines      Separate significant chronic issues discussed: Osteoporosis- counseled on weightbearing exercise, aerobic exercise, continue Fosamax, referral for updated bone density test  Hyperlipidemia - compliant with statin, labs today  Vitamin D deficiency-continue supplement, lab today  Hot flashes- doing fine on clonidine  Hematuria on UA, but negative RBC on urine microscopic done by me  today  Abbygayle was seen today for annual exam.  Diagnoses and all orders for this visit:  Encounter for health maintenance examination in adult -     DG Bone Density; Future -     Comprehensive metabolic panel -     CBC with Differential -     Lipid Panel -     Vitamin D, 25-hydroxy -     Ambulatory referral to Gastroenterology -     POCT Urinalysis DIP (Proadvantage Device) -     POCT UA - Microscopic Only  S/P hysterectomy  Vitamin D deficiency -     Vitamin D, 25-hydroxy  Osteoporosis, unspecified osteoporosis type, unspecified pathological fracture presence -     DG Bone Density; Future  Estrogen deficiency -     DG Bone Density; Future  High risk medication use  Hyperlipidemia, unspecified hyperlipidemia type  Menopausal state -     DG Bone Density; Future  Former smoker  Screen for colon cancer -     Ambulatory referral to Gastroenterology  Family history of colon cancer -     Ambulatory referral to Gastroenterology  Microscopic hematuria -     POCT UA - Microscopic Only  Other orders -     cloNIDine (CATAPRES) 0.1 MG tablet; Take 1 tablet (0.1 mg total) by mouth daily.   Follow-up pending labs, yearly for physical

## 2019-08-24 ENCOUNTER — Other Ambulatory Visit: Payer: Self-pay | Admitting: Medical

## 2019-08-24 LAB — CBC WITH DIFFERENTIAL/PLATELET
Basophils Absolute: 0 10*3/uL (ref 0.0–0.2)
Basos: 1 %
EOS (ABSOLUTE): 0.6 10*3/uL — ABNORMAL HIGH (ref 0.0–0.4)
Eos: 7 %
Hematocrit: 41 % (ref 34.0–46.6)
Hemoglobin: 13.8 g/dL (ref 11.1–15.9)
Immature Grans (Abs): 0 10*3/uL (ref 0.0–0.1)
Immature Granulocytes: 0 %
Lymphocytes Absolute: 1.8 10*3/uL (ref 0.7–3.1)
Lymphs: 23 %
MCH: 29 pg (ref 26.6–33.0)
MCHC: 33.7 g/dL (ref 31.5–35.7)
MCV: 86 fL (ref 79–97)
Monocytes Absolute: 0.6 10*3/uL (ref 0.1–0.9)
Monocytes: 8 %
Neutrophils Absolute: 4.7 10*3/uL (ref 1.4–7.0)
Neutrophils: 61 %
Platelets: 216 10*3/uL (ref 150–450)
RBC: 4.76 x10E6/uL (ref 3.77–5.28)
RDW: 13.1 % (ref 11.7–15.4)
WBC: 7.8 10*3/uL (ref 3.4–10.8)

## 2019-08-24 LAB — COMPREHENSIVE METABOLIC PANEL
ALT: 12 IU/L (ref 0–32)
AST: 17 IU/L (ref 0–40)
Albumin/Globulin Ratio: 1.7 (ref 1.2–2.2)
Albumin: 4.4 g/dL (ref 3.8–4.8)
Alkaline Phosphatase: 75 IU/L (ref 39–117)
BUN/Creatinine Ratio: 16 (ref 12–28)
BUN: 11 mg/dL (ref 8–27)
Bilirubin Total: 0.3 mg/dL (ref 0.0–1.2)
CO2: 23 mmol/L (ref 20–29)
Calcium: 9.3 mg/dL (ref 8.7–10.3)
Chloride: 107 mmol/L — ABNORMAL HIGH (ref 96–106)
Creatinine, Ser: 0.69 mg/dL (ref 0.57–1.00)
GFR calc Af Amer: 109 mL/min/{1.73_m2} (ref >59)
GFR calc non Af Amer: 94 mL/min/{1.73_m2} (ref >59)
Globulin, Total: 2.6 g/dL (ref 1.5–4.5)
Glucose: 94 mg/dL (ref 65–99)
Potassium: 4.2 mmol/L (ref 3.5–5.2)
Sodium: 143 mmol/L (ref 134–144)
Total Protein: 7 g/dL (ref 6.0–8.5)

## 2019-08-24 LAB — VITAMIN D 25 HYDROXY (VIT D DEFICIENCY, FRACTURES): Vit D, 25-Hydroxy: 29.8 ng/mL — ABNORMAL LOW (ref 30.0–100.0)

## 2019-08-24 LAB — LIPID PANEL
Chol/HDL Ratio: 3.4 ratio (ref 0.0–4.4)
Cholesterol, Total: 151 mg/dL (ref 100–199)
HDL: 45 mg/dL (ref >39)
LDL Chol Calc (NIH): 92 mg/dL (ref 0–99)
Triglycerides: 74 mg/dL (ref 0–149)
VLDL Cholesterol Cal: 14 mg/dL (ref 5–40)

## 2019-08-24 MED ORDER — ALENDRONATE SODIUM 70 MG PO TABS: ORAL_TABLET | ORAL | 0 refills | Status: DC

## 2019-08-24 MED ORDER — VALACYCLOVIR HCL 1 G PO TABS: 2000.0000 mg | ORAL_TABLET | Freq: Two times a day (BID) | ORAL | 2 refills | Status: DC

## 2019-08-24 MED ORDER — VITAMIN D 25 MCG (1000 UNIT) PO TABS: 2000.0000 [IU] | ORAL_TABLET | Freq: Every day | ORAL | 3 refills | Status: DC

## 2019-08-24 MED ORDER — ROSUVASTATIN CALCIUM 5 MG PO TABS: ORAL_TABLET | ORAL | 3 refills | Status: DC

## 2019-08-25 ENCOUNTER — Other Ambulatory Visit: Payer: Self-pay

## 2019-08-25 ENCOUNTER — Ambulatory Visit
Admission: RE | Admit: 2019-08-25 | Discharge: 2019-08-25 | Disposition: A | Discharge status: Home / Self Care | Payer: BC Managed Care – PPO | Source: Ambulatory Visit | Attending: Medical | Admitting: Medical

## 2019-08-25 DIAGNOSIS — N951 Menopausal and female climacteric states: Secondary | ICD-10-CM

## 2019-08-25 DIAGNOSIS — M810 Age-related osteoporosis without current pathological fracture: Secondary | ICD-10-CM

## 2019-08-25 DIAGNOSIS — Z0000 Encounter for general adult medical examination without abnormal findings: Secondary | ICD-10-CM

## 2019-08-25 DIAGNOSIS — E2839 Other primary ovarian failure: Secondary | ICD-10-CM

## 2019-09-08 ENCOUNTER — Ambulatory Visit: Payer: BC Managed Care – PPO | Admitting: Physician Assistant

## 2019-09-13 ENCOUNTER — Other Ambulatory Visit: Payer: Self-pay | Admitting: Medical

## 2019-09-22 ENCOUNTER — Ambulatory Visit: Payer: BC Managed Care – PPO | Admitting: Physician Assistant

## 2019-09-29 ENCOUNTER — Other Ambulatory Visit: Payer: Self-pay | Admitting: Medical

## 2019-10-04 ENCOUNTER — Ambulatory Visit: Payer: BC Managed Care – PPO | Admitting: Gastroenterology

## 2019-10-17 NOTE — Progress Notes (Signed)
00    10/17/2019 Olegario Messier [id number] August 24, 1955   CHIEF COMPLAINT: Schedule a colonoscopy   HISTORY OF PRESENT ILLNESS:  Holly [name] is a 64 year old female with a past medical history of hypertension. Past partial  Hysterectomy 20+ years ago. She was referred to our office by Dr Chana Bode to schedule a screening colonoscopy. Her most recent colonoscopy was completed by Dr. Deatra Ina in 2014 which showed diverticulosis throughout the colon, no polyps. She required a 2 day bowel prep. Her initial screening colonoscopy in 2000 showed external hemorrhoids otherwise was normal per her report. Her twin brother was diagnosed with colon cancer at the age of 26. She has a history of constipation for which she takes an OTC colon cleanse once weekly which keeps her bowels moving regularly. She passes a normal formed brown bowel movement once daily. No rectal bleeding or melena. 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 However, her vague lower abdominal discomfort goes away after she passes a BM. Never been diagnosed with diverticulitis. No fever, sweats or chills. She has gained 20lbs over the past year and she questions if her weight gain may also contribute to her lower abdominal discomfort. She denies having any dysphagia, heartburn or stomach pain. Father died from stomach caner. She received her 1st Covid vaccination on 10/15/2019. No other complaints today.     CBC Latest Ref Rng & Units 08/23/2019 08/21/2018 08/20/2017  WBC 3.4 - 10.8 x10E3/uL 7.8 6.4 8.1  Hemoglobin 11.1 - 15.9 g/dL 13.8 13.4 12.7  Hematocrit 34.0 - 46.6 % 41.0 39.6 39.3  Platelets 150 - 450 x10E3/uL 216 262 255   CMP Latest Ref Rng & Units 08/23/2019 08/21/2018 08/20/2017  Glucose 65 - 99 mg/dL 94 85 75  BUN 8 - 27 mg/dL 11 12 12   Creatinine 0.57 - 1.00 mg/dL 0.69 0.63 0.51  Sodium 134 - 144 mmol/L 143 143 141  Potassium 3.5 - 5.2 mmol/L  4.2 4.3 4.0  Chloride 96 - 106 mmol/L 107(H) 105 107  CO2 20 - 29 mmol/L 23 23 27   Calcium 8.7 - 10.3 mg/dL 9.3 9.2 9.0  Total Protein 6.0 - 8.5 g/dL 7.0 7.2 6.8  Total Bilirubin 0.0 - 1.2 mg/dL 0.3 0.3 0.5  Alkaline Phos 39 - 117 IU/L 75 68 -  AST 0 - 40 IU/L 17 13 16   ALT 0 - 32 IU/L 12 11 7     Colonoscopy 02/11/2013 by Dr. 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 Mild diverticulosis was noted in the sigmoid, descending, transverse and ascending colon. Internal hemorrhoids.  Colonoscopy 09/27/1998: External hemorrhoids.   Past Medical History:  Diagnosis Date  . Chronic constipation    enrolled in a constipation study with [facility] 01/2013, on Plecanatide 6mg  daily  . Diverticulosis 01/2013   on colonoscopy  . Former smoker 2018  . H/O echocardiogram 03/2014   normal, Dr. Tollie Eth  . 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 gets once to twice yearly  . Hyperlipidemia   . Hypertension    controlled off medication 2019  . Menopausal disorder   . Microscopic hematuria 04/2013   urology eval, no worrisome causes.  recheck only with frank hematuria.  . Normal cardiac stress test 03/23/14   Dr. Tollie Eth  . S/P hysterectomy    due to fibroids  . Sleep difficulties    due to menopause  . SOB (  shortness of breath)    cardiac and PFT eval 03/2014  . Vitamin D deficiency   . Wears glasses    Past Surgical History:  Procedure Laterality Date  . COLONOSCOPY  01/2013   external hemorrohids, severe melanosis, diverticulosis; Dr. Deatra Ina.  prior colonoscopy Dr. Collene Mares.  . ECTOPIC PREGNANCY SURGERY  1990  . PARTIAL HYSTERECTOMY  1989   still has 1 ovary; hx/o uterine fibroids   Family History: Brother diagnosed with colon caner at the age of 82, CVA and seizures. Dad stomach cancer died age 33. Mother 47 with dementia, DM, HTN.  Social History: Widowed. School bus driver.  She smoked 2 cigarettes daily x 10 years, years quit 3 years ago. She drinks 1 glass  of wine 3 days weekly. No drug use.   Current Outpatient Medications on File Prior to Visit  Medication Sig Dispense Refill  . alendronate (FOSAMAX) 70 MG tablet TAKE 1 TABLET BY MOUTH EVERY WEEK. TAKE WITH A FULL GLASS OF WATER ON AN EMPTY STOMACH 12 tablet 0  . calcium carbonate (CALCIUM 600) 600 MG TABS tablet Take 600 mg by mouth daily with breakfast.    . cholecalciferol (VITAMIN D3) 25 MCG (1000 UT) tablet Take 2 tablets (2,000 Units total) by mouth daily. 180 tablet 3  . cloNIDine (CATAPRES) 0.1 MG tablet Take 1 tablet (0.1 mg total) by mouth daily. 90 tablet 3  . fluticasone (FLONASE) 50 MCG/ACT nasal spray INSTILL 1 SPRAY INTO EACH NOSTRIL TWICE A DAY 16 g 2  . loratadine (CLARITIN) 10 MG tablet Take 10 mg by mouth daily.    . Naproxen Sod-Diphenhydramine (ALEVE PM PO) Take by mouth.    . rosuvastatin (CRESTOR) 5 MG tablet 1 tablet po daily QHS 90 tablet 3  . valACYclovir (VALTREX) 1000 MG tablet Take 2 tablets (2,000 mg total) by mouth 2 (two) times daily. 30 tablet 2   No current facility-administered medications on file prior to visit.   Allergies  Allergen Reactions  . Aspirin     Upsets her stomach taking daily  . Pravachol [Pravastatin Sodium]     Leg aches  . Penicillins Rash    REVIEW OF SYSTEMS: All other systems reviewed and negative except where noted in the History of Present Illness.   PHYSICAL EXAM: BP 130/82   Pulse 76   Temp 98 F (36.7 C)   Ht 5\' 5"  (1.651 m)   Wt 173 lb 6 oz (78.6 kg)   BMI 28.85 kg/m  General: Well developed 64 year old female in no acute distress. Head: Normocephalic and atraumatic. Eyes:  Sclerae non-icteric, conjunctive pink. Ears: Normal auditory acuity. Mouth: Dentition intact. No ulcers or lesions.  Neck: Supple, no lymphadenopathy or thyromegaly.  Lungs: Clear bilaterally to auscultation without wheezes, crackles or rhonchi. Heart: Regular rate and rhythm. No murmur, rub or gallop appreciated.  Abdomen: Soft, nontender,  non distended. No masses. No hepatosplenomegaly. Normoactive bowel sounds x 4 quadrants.  Rectal: Deferred.  Musculoskeletal: Symmetrical with no gross deformities. Skin: Warm and dry. No rash or lesions on visible extremities. Extremities: No edema. Neurological: Alert oriented x 4, no focal deficits.  Psychological:  Alert and cooperative. Normal mood and affect.  ASSESSMENT AND PLAN:  1. Colon cancer screening. Twin brother with history of colon cancer.  -Colonoscopy benefits and risks discussed including risk with sedation, risk of bleeding, perforation and infection. Two day bowel prep   2. Lower abdominal "discomfort" once monthly relieved after BM passed. Patient at risk for diverticulitis.  No lower abdominal pain at this time. -Patient to call our office if her lower abdominal discomfort worsens/becomes more frequent prior to her colonoscopy  3. Diverticulosis throughout the colon. - Avoid constipation -Miralax as needed   4. Constipation. - See plan in # 2  Further follow up to be determined after colonoscopy completed     CC:  Tysinger, Camelia Eng, PA-C

## 2019-10-18 ENCOUNTER — Other Ambulatory Visit: Payer: Self-pay

## 2019-10-18 ENCOUNTER — Ambulatory Visit: Payer: BC Managed Care – PPO | Admitting: Nurse Practitioner

## 2019-10-18 ENCOUNTER — Encounter: Payer: Self-pay | Admitting: Nurse Practitioner

## 2019-10-18 VITALS — BP 130/82 | HR 76 | Temp 98.0°F | Ht 65.0 in | Wt 173.4 lb

## 2019-10-18 DIAGNOSIS — K5790 Diverticulosis of intestine, part unspecified, without perforation or abscess without bleeding: Secondary | ICD-10-CM | POA: Insufficient documentation

## 2019-10-18 DIAGNOSIS — R1030 Lower abdominal pain, unspecified: Secondary | ICD-10-CM

## 2019-10-18 DIAGNOSIS — Z800 Family history of malignant neoplasm of digestive organs: Secondary | ICD-10-CM | POA: Diagnosis not present

## 2019-10-18 DIAGNOSIS — K5900 Constipation, unspecified: Secondary | ICD-10-CM | POA: Diagnosis not present

## 2019-10-18 DIAGNOSIS — Z1211 Encounter for screening for malignant neoplasm of colon: Secondary | ICD-10-CM | POA: Diagnosis not present

## 2019-10-18 MED ORDER — NA SULFATE-K SULFATE-MG SULF 17.5-3.13-1.6 GM/177ML PO SOLN: 1.0000 | Freq: Once | ORAL | 0 refills | Status: AC

## 2019-10-18 NOTE — Patient Instructions (Signed)
If you are age 62 or older, your body mass index should be between 23-30. Your Body mass index is 28.85 kg/m. If this is out of the aforementioned range listed, please consider follow up with your Primary Care Provider.  If you are age 10 or younger, your body mass index should be between 19-25. Your Body mass index is 28.85 kg/m. If this is out of the aformentioned range listed, please consider follow up with your Primary Care Provider.   We have sent the following medications to your pharmacy for you to pick up at your convenience:  [place] have been scheduled for a colonoscopy. Please follow written instructions given to you at your visit today.  Please pick up your prep supplies at the pharmacy within the next 1-3 days. If you use inhalers (even only as needed), please bring them with you on the day of your procedure.  Please purchase the following medications over the counter and take as directed:  Miralax as needed.  Further follow up to be determined after your colonoscopy is complete.  Due to recent changes in healthcare laws, you may see the results of your imaging and laboratory studies on MyChart before your provider has had a chance to review them.  We understand that in some cases there may be results that are confusing or concerning to you. Not all laboratory results come back in the same time frame and the provider may be waiting for multiple results in order to interpret others.  Please give [place] 48 hours in order for your provider to thoroughly review all the results before contacting the office for clarification of your results.

## 2019-10-18 NOTE — Progress Notes (Signed)
I agree with the above note, plan.  2 day prep, colonsocopy for FH of colon cancer.

## 2019-11-01 ENCOUNTER — Telehealth: Payer: Self-pay | Admitting: Nurse Practitioner

## 2019-11-01 NOTE — Telephone Encounter (Signed)
Pt is scheduled for a colon 11/24/19 and stated that she cannot afford prep soln.

## 2019-11-01 NOTE — Telephone Encounter (Signed)
Left a voicemail for the patient to contact the office regarding her prep.

## 2019-11-02 ENCOUNTER — Telehealth: Payer: Self-pay | Admitting: General Surgery

## 2019-11-02 NOTE — Telephone Encounter (Signed)
No prep is covered by her insurance. Patient will pick up a suprep kit sample.

## 2019-11-02 NOTE — Telephone Encounter (Signed)
Patient contacted the office regarding her prep. Her insurance pays for no preps she is unable to afford the suprep. Left a suprep sample at front desk for her. Patient will pick it up tomorrow,

## 2019-11-18 ENCOUNTER — Encounter: Payer: Self-pay | Admitting: Gastroenterology

## 2019-11-23 ENCOUNTER — Encounter: Payer: Self-pay | Admitting: Medical

## 2019-11-24 ENCOUNTER — Other Ambulatory Visit: Payer: Self-pay

## 2019-11-24 ENCOUNTER — Encounter: Payer: Self-pay | Admitting: Gastroenterology

## 2019-11-24 ENCOUNTER — Ambulatory Visit (AMBULATORY_SURGERY_CENTER): Payer: BC Managed Care – PPO | Admitting: Gastroenterology

## 2019-11-24 VITALS — BP 129/84 | HR 61 | Temp 95.5°F | Resp 15 | Ht 65.0 in | Wt 173.0 lb

## 2019-11-24 DIAGNOSIS — Z1211 Encounter for screening for malignant neoplasm of colon: Secondary | ICD-10-CM

## 2019-11-24 DIAGNOSIS — Z800 Family history of malignant neoplasm of digestive organs: Secondary | ICD-10-CM | POA: Diagnosis not present

## 2019-11-24 DIAGNOSIS — D122 Benign neoplasm of ascending colon: Secondary | ICD-10-CM

## 2019-11-24 MED ORDER — SODIUM CHLORIDE 0.9 % IV SOLN: 500.0000 mL | Freq: Once | INTRAVENOUS | Status: DC

## 2019-11-24 NOTE — Patient Instructions (Signed)
Handout on polyps, diverticulosis and hemorrhoids.   YOU HAD AN ENDOSCOPIC PROCEDURE TODAY AT [facility]:   Refer to the procedure report that was given to you for any specific questions about what was found during the examination.  If the procedure report does not answer your questions, please call your gastroenterologist to clarify.  If you requested that your care partner not be given the details of your procedure findings, then the procedure report has been included in a sealed envelope for you to review at your convenience later.  YOU SHOULD EXPECT: Some feelings of bloating in the abdomen. Passage of more gas than usual.  Walking can help get rid of the air that was put into your GI tract during the procedure and reduce the bloating. If you had a lower endoscopy (such as a colonoscopy or flexible sigmoidoscopy) you may notice spotting of blood in your stool or on the toilet paper. If you underwent a bowel prep for your procedure, you may not have a normal bowel movement for a few days.  Please Note:  You might notice some irritation and congestion in your nose or some drainage.  This is from the oxygen used during your procedure.  There is no need for concern and it should clear up in a day or so.  SYMPTOMS TO REPORT IMMEDIATELY:   Following lower endoscopy (colonoscopy or flexible sigmoidoscopy):  Excessive amounts of blood in the stool  Significant tenderness or worsening of abdominal pains  Swelling of the abdomen that is new, acute  Fever of 100F or higher   For urgent or emergent issues, a gastroenterologist can be reached at any hour by calling [phone]. Do not use MyChart messaging for urgent concerns.    DIET:  We do recommend a small meal at first, but then you may proceed to your regular diet.  Drink plenty of fluids but you should avoid alcoholic beverages for 24 hours.  ACTIVITY:  You should plan to take it easy for the rest of today and you should  NOT DRIVE or use heavy machinery until tomorrow (because of the sedation medicines used during the test).    FOLLOW UP: Our staff will call the number listed on your records 48-72 hours following your procedure to check on you and address any questions or concerns that you may have regarding the information given to you following your procedure. If we do not reach you, we will leave a message.  We will attempt to reach you two times.  During this call, we will ask if you have developed any symptoms of COVID 19. If you develop any symptoms (ie: fever, flu-like symptoms, shortness of breath, cough etc.) before then, please call [phone].  If you test positive for Covid 19 in the 2 weeks post procedure, please call and report this information to [place].    If any biopsies were taken you will be contacted by phone or by letter within the next 1-3 weeks.  Please call us at [phone] if you have not heard about the biopsies in 3 weeks.    SIGNATURES/CONFIDENTIALITY: You and/or your care partner have signed paperwork which will be entered into your electronic medical record.  These signatures attest to the fact that that the information above on your After Visit Summary has been reviewed and is understood.  Full responsibility of the confidentiality of this discharge information lies with you and/or your care-partner.

## 2019-11-24 NOTE — Progress Notes (Signed)
pt tolerated well. VSS. awake and to recovery. Report given to RN.  

## 2019-11-24 NOTE — Op Note (Signed)
[facility] Patient Name: Equilla Pyland Procedure Date: 11/24/2019 1:29 PM MRN: [id number] Endoscopist: Milus Banister , MD Age: 64 Referring MD:  Date of Birth: 11-24-1955 Gender: Female Account #: [id number] Procedure:                Colonoscopy Indications:              Screening in patient at increased risk: Family                            history of 1st-degree relative with colorectal                            cancer (twin bother in his 23s) Medicines:                Monitored Anesthesia Care Procedure:                Pre-Anesthesia Assessment:                           - Prior to the procedure, a History and Physical                            was performed, and patient medications and                            allergies were reviewed. The patient's tolerance of                            previous anesthesia was also reviewed. The risks                            and benefits of the procedure and the sedation                            options and risks were discussed with the patient.                            All questions were answered, and informed consent                            was obtained. Prior Anticoagulants: The patient has                            taken no previous anticoagulant or antiplatelet                            agents. ASA Grade Assessment: II - A patient with                            mild systemic disease. After reviewing the risks                            and benefits, the patient was deemed in  satisfactory condition to undergo the procedure.                           After obtaining informed consent, the colonoscope                            was passed under direct vision. Throughout the                            procedure, the patient's blood pressure, pulse, and                            oxygen saturations were monitored continuously. The                            Colonoscope was introduced  through the anus and                            advanced to the the cecum, identified by                            appendiceal orifice and ileocecal valve. The                            colonoscopy was performed without difficulty. The                            patient tolerated the procedure well. The quality                            of the bowel preparation was good. The ileocecal                            valve, appendiceal orifice, and rectum were                            photographed. Scope In: 1:32:54 PM Scope Out: 1:48:41 PM Scope Withdrawal Time: 0 hours 10 minutes 22 seconds  Total Procedure Duration: 0 hours 15 minutes 47 seconds  Findings:                 A 2 mm polyp was found in the ascending colon. The                            polyp was sessile. The polyp was removed with a                            cold snare. Resection and retrieval were complete.                           Multiple small and large-mouthed diverticula were                            found in the left colon.  Internal hemorrhoids were found. The hemorrhoids                            were small.                           A diffuse area of moderate melanosis was found in                            the entire colon.                           The exam was otherwise without abnormality on                            direct and retroflexion views. Complications:            No immediate complications. Estimated blood loss:                            None. Estimated Blood Loss:     Estimated blood loss: none. Impression:               - One 2 mm polyp in the ascending colon, removed                            with a cold snare. Resected and retrieved.                           - Diverticulosis in the left colon.                           - Internal hemorrhoids.                           - Melanosis in the colon.                           - The examination was otherwise normal  on direct                            and retroflexion views. Recommendation:           - Patient has a contact number available for                            emergencies. The signs and symptoms of potential                            delayed complications were discussed with the                            patient. Return to normal activities tomorrow.                            Written discharge instructions were provided to the  patient.                           - Resume previous diet.                           - Continue present medications.                           - Await pathology results. Milus Banister, MD 11/24/2019 1:51:28 PM This report has been signed electronically.

## 2019-11-24 NOTE — Progress Notes (Signed)
Temp check by:JB Vital check by:DT  The medical and surgical history was reviewed and verified with the patient. 

## 2019-11-26 ENCOUNTER — Telehealth: Payer: Self-pay

## 2019-11-26 NOTE — Telephone Encounter (Signed)
  Follow up Call-  Call back number 11/24/2019  Post procedure Call Back phone  # [phone]  Permission to leave phone message Yes  Some recent data might be hidden     Patient questions:  Do you have a fever, pain , or abdominal swelling? No. Pain Score  0 *  Have you tolerated food without any problems? Yes.    Have you been able to return to your normal activities? Yes.    Do you have any questions about your discharge instructions: Diet   No. Medications  No. Follow up visit  No.  Do you have questions or concerns about your Care? No.  Actions: * If pain score is 4 or above: No action needed, pain <4. 1. Have you developed a fever since your procedure? no  2.   Have you had an respiratory symptoms (SOB or cough) since your procedure? no  3.   Have you tested positive for COVID 19 since your procedure no  4.   Have you had any family members/close contacts diagnosed with the COVID 19 since your procedure?  no   If yes to any of these questions please route to Joylene John, RN and Erenest Rasher, RN

## 2019-11-26 NOTE — Telephone Encounter (Signed)
NO ANSWER, MESSAGE LEFT FOR PATIENT. 

## 2019-11-30 ENCOUNTER — Encounter: Payer: Self-pay | Admitting: Gastroenterology

## 2019-12-10 ENCOUNTER — Telehealth: Payer: Self-pay

## 2019-12-10 ENCOUNTER — Other Ambulatory Visit: Payer: Self-pay

## 2019-12-10 MED ORDER — FLUTICASONE PROPIONATE 50 MCG/ACT NA SUSP: NASAL | 2 refills | Status: DC

## 2019-12-10 NOTE — Telephone Encounter (Signed)
Pt. Called stating she needs a refill on her flonase to the [facility] on [address]. Pt last apt was 09/12/19.

## 2019-12-10 NOTE — Telephone Encounter (Signed)
Flonase was sent

## 2019-12-22 ENCOUNTER — Other Ambulatory Visit: Payer: Self-pay | Admitting: Medical

## 2019-12-23 NOTE — Telephone Encounter (Signed)
Is this appropriate?  

## 2019-12-27 ENCOUNTER — Other Ambulatory Visit: Payer: Self-pay

## 2019-12-27 ENCOUNTER — Encounter: Payer: Self-pay | Admitting: Medical

## 2019-12-27 ENCOUNTER — Ambulatory Visit: Payer: BC Managed Care – PPO | Admitting: Medical

## 2019-12-27 VITALS — BP 136/86 | HR 66 | Temp 98.2°F | Ht 65.0 in | Wt 167.0 lb

## 2019-12-27 DIAGNOSIS — M25512 Pain in left shoulder: Secondary | ICD-10-CM | POA: Diagnosis not present

## 2019-12-27 DIAGNOSIS — M79602 Pain in left arm: Secondary | ICD-10-CM | POA: Insufficient documentation

## 2019-12-27 DIAGNOSIS — G8929 Other chronic pain: Secondary | ICD-10-CM

## 2019-12-27 DIAGNOSIS — R202 Paresthesia of skin: Secondary | ICD-10-CM | POA: Insufficient documentation

## 2019-12-27 DIAGNOSIS — M5412 Radiculopathy, cervical region: Secondary | ICD-10-CM | POA: Insufficient documentation

## 2019-12-27 DIAGNOSIS — R413 Other amnesia: Secondary | ICD-10-CM | POA: Insufficient documentation
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
## 2020-01-03 ENCOUNTER — Ambulatory Visit: Payer: BC Managed Care – PPO | Admitting: Family Medicine

## 2020-03-10 LAB — HM MAMMOGRAPHY

## 2020-03-13 ENCOUNTER — Encounter: Payer: Self-pay | Admitting: Medical

## 2020-03-13 ENCOUNTER — Telehealth: Payer: Self-pay | Admitting: Medical

## 2020-03-13 NOTE — Telephone Encounter (Signed)
I am happy to report that her mammogram was normal, no worrisome findings.

## 2020-03-13 NOTE — Telephone Encounter (Signed)
Lmom informing patient of normal mammogram result. Asked patient to call back should she have any questions.

## 2020-03-14 ENCOUNTER — Encounter: Payer: Self-pay | Admitting: Medical

## 2020-05-30 ENCOUNTER — Ambulatory Visit: Payer: BC Managed Care – PPO | Admitting: Medical

## 2020-06-12 ENCOUNTER — Ambulatory Visit: Payer: BC Managed Care – PPO | Admitting: Medical

## 2020-06-12 ENCOUNTER — Encounter: Payer: Self-pay | Admitting: Medical

## 2020-06-12 ENCOUNTER — Other Ambulatory Visit: Payer: Self-pay

## 2020-06-12 VITALS — BP 140/100 | HR 69 | Ht 65.0 in | Wt 162.4 lb

## 2020-06-12 DIAGNOSIS — R030 Elevated blood-pressure reading, without diagnosis of hypertension: Secondary | ICD-10-CM | POA: Diagnosis not present

## 2020-06-12 DIAGNOSIS — Z6379 Other stressful life events affecting family and household: Secondary | ICD-10-CM | POA: Diagnosis not present

## 2020-06-12 DIAGNOSIS — R413 Other amnesia: Secondary | ICD-10-CM | POA: Diagnosis not present

## 2020-06-12 DIAGNOSIS — Z79899 Other long term (current) drug therapy: Secondary | ICD-10-CM

## 2020-06-12 DIAGNOSIS — Z636 Dependent relative needing care at home: Secondary | ICD-10-CM | POA: Diagnosis not present

## 2020-06-12 MED ORDER — FLUOXETINE HCL 10 MG PO CAPS: 10.0000 mg | ORAL_CAPSULE | Freq: Every day | ORAL | 2 refills | Status: DC

## 2020-06-12 MED ORDER — GINKGO BILOBA PLUS-GINSENG 50-150-250-50 MG PO TABS: 1.0000 | ORAL_TABLET | Freq: Every day | ORAL | 2 refills | Status: DC

## 2020-06-12 NOTE — Progress Notes (Signed)
Acute Office Visit  Subjective:    Patient ID: Holly [name], female    DOB: [birth year], 64 y.o.   MRN: [id number]  Chief Complaint  Patient presents with  . Memory Loss   Here for concerns about memory vs stress.  Has had a few times forgetting things.   She notes she lost her house key recently.   She also went to [facility] recently.  The clerk put her credit card in her bag with her merchandise, and she forgot that until later on at the house.  She wants something to help her focus or wants to make sure her medications are causing memory problems.   She does have a lot of stress.  She is helping caregiver for her mother.   Her elderly mother just had gall bladder surgery 2 weeks ago.  Her mother has memory issues.   Her father had memory issues as well.    Laylamarie and her sister take turns every 2 weeks taking care of her sister, living there /staying there watching after her part of the day.  Their mother is still somewhat independent.   Lexxie is at her house every other week.  Since Maripat's husband died 3 years ago, her daughter and 3 grandchildren moved in.  They currently still live there.  Sheilah still works 2 jobs.  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  Still works part time at [employer] for events, up to 5 hours per night 3 nights per week.  She is also involved in her grandson's sports.  She wants to be busy at work and with her grandsons as otherwise she would be down and depressed since losing her husband to cancer 3 years ago.  She denies any job related memory issues.  Hasn't forgotten where to drop a child off at a bus stop, has been written up for any issues, hasn't had any traffic violation.     Regarding elevated BP today, no prior BP medication, no chest pain, no palpitations, no edema.  She did rush to get here today, doing several errands before getting her.    No other aggravating or relieving factors. No other complaint.   Past Medical  History:  Diagnosis Date  . Chronic constipation    enrolled in a constipation study with [place] 01/2013, on Plecanatide 6mg  daily  . Diverticulosis 01/2013   on colonoscopy  . Former smoker 2018  . H/O echocardiogram 03/2014   normal, Dr. Tollie Eth  . 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 gets once to twice yearly  . Hyperlipidemia   . Hypertension    controlled off medication 2019  . Menopausal disorder   . Microscopic hematuria 04/2013   urology eval, no worrisome causes.  recheck only with frank hematuria.  . Normal cardiac stress test 03/23/14   Dr. Tollie Eth  . S/P hysterectomy    due to fibroids  . Sleep difficulties    due to menopause  . SOB (shortness of breath)    cardiac and PFT eval 03/2014  . Vitamin D deficiency   . Wears glasses    Current Outpatient Medications on File Prior to Visit  Medication Sig Dispense Refill  . alendronate (FOSAMAX) 70 MG tablet TAKE 1 TABLET BY MOUTH EVERY WEEK WITH A FULL GLASS OF WATER AND ON AN EMPTY STOMACH 12 tablet 4  . calcium carbonate (CALCIUM 600) 600 MG TABS tablet Take 600 mg  by mouth daily with breakfast.     . cholecalciferol (VITAMIN D3) 25 MCG (1000 UT) tablet Take 2 tablets (2,000 Units total) by mouth daily. 180 tablet 3  . cloNIDine (CATAPRES) 0.1 MG tablet Take 1 tablet (0.1 mg total) by mouth daily. 90 tablet 3  . fluticasone (FLONASE) 50 MCG/ACT nasal spray INSTILL 1 SPRAY INTO EACH NOSTRIL TWICE A DAY 16 g 2  . loratadine (CLARITIN) 10 MG tablet Take 10 mg by mouth daily.     . rosuvastatin (CRESTOR) 5 MG tablet 1 tablet po daily QHS 90 tablet 3  . valACYclovir (VALTREX) 1000 MG tablet Take 2 tablets (2,000 mg total) by mouth 2 (two) times daily. (Patient not taking: Reported on 12/27/2019) 30 tablet 2   No current facility-administered medications on file prior to visit.   ROS as in subjective     Objective:    Physical Exam  BP (!) 140/100   Pulse 69   Ht 5\' 5"  (1.651 m)    Wt 162 lb 6.4 oz (73.7 kg)   SpO2 98%   BMI 27.02 kg/m     General appearence: alert, no distress, WD/WN,  HEENT: normocephalic, sclerae anicteric, PERRLA, EOMi, nares patent, no discharge or erythema, pharynx normal Oral cavity: MMM, no lesions Neck: supple, no lymphadenopathy, no thyromegaly, no masses, no bruits Heart: RRR, normal S1, S2, no murmurs Lungs: CTA bilaterally, no wheezes, rhonchi, or rales Extremities: no edema, no cyanosis, no clubbing Pulses: 2+ symmetric, upper and lower extremities, normal cap refill Neurological: alert, oriented x 3, CN2-12 intact, strength normal upper extremities and lower extremities, sensation normal throughout, DTRs 2+ throughout, no cerebellar signs, gait normal Psychiatric: normal affect, behavior normal, pleasant       Assessment & Plan:   Encounter Diagnoses  Name Primary?  . Caregiver burden Yes  . Elevated blood-pressure reading without diagnosis of hypertension   . Memory change   . Stressful life event affecting family   . High risk medication use     MMSE score of 29/30.  We discussed her concerns. I suspect her memory concern has more edema overall stressors and busy schedule. She is trying to work 2 jobs, take care of her mother, live in 2 different places every other week as caregiver, and maintain her own bills and household.  I advise she prioritize her schedule and life goals. She may need to cut back on her nighttime job schedule.    I advise she spend the most time on what she values most.   I advise she have contingency plans for mother in the event mother's health needs more advanced care such as nursing facility or nurse aide.  I advise doing things to keep her brain strong such as reading regularly, doing things that keep her active such as exercise, keeping up with current events, using calendars and reading the paper.  I do not believe any of her medications are causing a problem but we did discuss how statin  sometimes good people mental fog she is on a very low dose.   She uses clonidine at bedtime for hot flashes.  She will begin trial of ginseng and ginkgo supplements  She will begin trial of Prozac fluoxetine daily at bedtime  I will see her back in a couple weeks for recheck on fluoxetine and blood pressure as her blood pressure was elevated today out of the ordinary  Consider neurology consult if she continues to have concerns about memory.    Doralyn  was seen today for memory loss.  Diagnoses and all orders for this visit:  Caregiver burden  Elevated blood-pressure reading without diagnosis of hypertension  Memory change  Stressful life event affecting family  High risk medication use  Other orders -     FLUoxetine (PROZAC) 10 MG capsule; Take 1 capsule (10 mg total) by mouth daily. -     Ginkgo-Ginseng-Gotu Kol-Lecith (GINKGO BILOBA PLUS-GINSENG) 50-150-250-50 MG TABS; Take 1 tablet by mouth daily.   F/u 2 wk for BP and f/u

## 2020-07-03 ENCOUNTER — Other Ambulatory Visit: Payer: BC Managed Care – PPO

## 2020-07-04 ENCOUNTER — Telehealth: Payer: Self-pay

## 2020-07-04 ENCOUNTER — Other Ambulatory Visit: Payer: BC Managed Care – PPO

## 2020-07-04 ENCOUNTER — Other Ambulatory Visit: Payer: Self-pay | Admitting: Medical

## 2020-07-04 ENCOUNTER — Other Ambulatory Visit: Payer: Self-pay

## 2020-07-04 MED ORDER — OLMESARTAN MEDOXOMIL 20 MG PO TABS: 20.0000 mg | ORAL_TABLET | Freq: Every day | ORAL | 1 refills | Status: DC

## 2020-07-04 NOTE — Telephone Encounter (Signed)
Given the ongoing high readings let us start her on olmesartan blood pressure pill once daily in the morning.  Let us plan to follow-up in 6 weeks, sooner if needed.  This medicine usually is well-tolerated.  However this medicine can cause dizziness, lightheadedness or allergic rash that could include lip swelling or tongue swelling.  I do not expect any problems with the medicine and the swollen lip/tongue issues is pretty rare

## 2020-07-04 NOTE — Telephone Encounter (Signed)
Lvm advising pt KH 

## 2020-07-04 NOTE — Telephone Encounter (Signed)
Pt had a b/p check first reading was 144/98 pulse 64 and second reading was 152/100 pulse was 63. Please advise [facility]

## 2020-07-05 NOTE — Telephone Encounter (Signed)
Pt was advised Kh 

## 2020-07-11 ENCOUNTER — Ambulatory Visit (INDEPENDENT_AMBULATORY_CARE_PROVIDER_SITE_OTHER): Payer: BC Managed Care – PPO | Admitting: Ophthalmology

## 2020-07-11 ENCOUNTER — Encounter (INDEPENDENT_AMBULATORY_CARE_PROVIDER_SITE_OTHER): Payer: Self-pay | Admitting: Ophthalmology

## 2020-07-11 ENCOUNTER — Other Ambulatory Visit: Payer: Self-pay

## 2020-07-11 DIAGNOSIS — H35411 Lattice degeneration of retina, right eye: Secondary | ICD-10-CM | POA: Diagnosis not present

## 2020-07-11 DIAGNOSIS — H33011 Retinal detachment with single break, right eye: Secondary | ICD-10-CM | POA: Insufficient documentation

## 2020-07-11 DIAGNOSIS — H43811 Vitreous degeneration, right eye: Secondary | ICD-10-CM

## 2020-07-11 NOTE — Assessment & Plan Note (Addendum)
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  Pictures were used to illuminate the discussion.  Patient stands or critical portions of laser repair of retinal detachment right eye today.

## 2020-07-11 NOTE — Progress Notes (Signed)
07/11/2020     CHIEF COMPLAINT Patient presents for Retina Evaluation   HISTORY OF PRESENT ILLNESS: Holly [name] is a 64 y.o. female who presents to the clinic today for:   HPI    Retina Evaluation    In right eye.  This started 2 weeks ago.  Duration of 2 weeks.  Associated Symptoms Floaters.          Comments    NP RETINAL TEAR OD, REF'D by S.GROAT   Pt reports blurry vision OD, black lines and dots in OD that come and go, pt also reports foreign body sensation, no pain or pressure. Slight headache on and off since onset.    Pt had Cat Sx OD 04/2020       Last edited by Nichola Sizer D on 07/11/2020  2:56 PM. (History)      Referring physician: Carlena Hurl, PA-C [place],  [place] [address]  HISTORICAL INFORMATION:   Selected notes from the MEDICAL RECORD NUMBER    Lab Results  Component Value Date   HGBA1C 5.9 (H) 08/20/2017     CURRENT MEDICATIONS: No current outpatient medications on file. (Ophthalmic Drugs)   No current facility-administered medications for this visit. (Ophthalmic Drugs)   Current Outpatient Medications (Other)  Medication Sig  . alendronate (FOSAMAX) 70 MG tablet TAKE 1 TABLET BY MOUTH EVERY WEEK WITH A FULL GLASS OF WATER AND ON AN EMPTY STOMACH  . calcium carbonate (CALCIUM 600) 600 MG TABS tablet Take 600 mg by mouth daily with breakfast.   . cholecalciferol (VITAMIN D3) 25 MCG (1000 UT) tablet Take 2 tablets (2,000 Units total) by mouth daily.  . cloNIDine (CATAPRES) 0.1 MG tablet Take 1 tablet (0.1 mg total) by mouth daily.  Marland Kitchen FLUoxetine (PROZAC) 10 MG capsule Take 1 capsule (10 mg total) by mouth daily.  . fluticasone (FLONASE) 50 MCG/ACT nasal spray INSTILL 1 SPRAY INTO EACH NOSTRIL TWICE A DAY  . Ginkgo-Ginseng-Gotu Kol-Lecith (GINKGO BILOBA PLUS-GINSENG) 50-150-250-50 MG TABS Take 1 tablet by mouth daily.  Marland Kitchen loratadine (CLARITIN) 10 MG tablet Take 10 mg by mouth daily.   Marland Kitchen olmesartan (BENICAR)  20 MG tablet Take 1 tablet (20 mg total) by mouth daily.  . rosuvastatin (CRESTOR) 5 MG tablet 1 tablet po daily QHS  . valACYclovir (VALTREX) 1000 MG tablet Take 2 tablets (2,000 mg total) by mouth 2 (two) times daily. (Patient not taking: Reported on 12/27/2019)   No current facility-administered medications for this visit. (Other)      REVIEW OF SYSTEMS:    ALLERGIES Allergies  Allergen Reactions  . Aspirin     Upsets her stomach taking daily  . Pravachol [Pravastatin Sodium]     Leg aches  . Penicillins Rash    PAST MEDICAL HISTORY Past Medical History:  Diagnosis Date  . Chronic constipation    enrolled in a constipation study with [facility] 01/2013, on Plecanatide 6mg  daily  . Diverticulosis 01/2013   on colonoscopy  . Former smoker 2018  . H/O echocardiogram 03/2014   normal, Dr. Tollie Eth  . 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 gets once to twice yearly  . Hyperlipidemia   . Hypertension    controlled off medication 2019  . Menopausal disorder   . Microscopic hematuria 04/2013   urology eval, no worrisome causes.  recheck only with frank hematuria.  . Normal cardiac stress test 03/23/14   Dr. Tollie Eth  . S/P  hysterectomy    due to fibroids  . Sleep difficulties    due to menopause  . SOB (shortness of breath)    cardiac and PFT eval 03/2014  . Vitamin D deficiency   . Wears glasses    Past Surgical History:  Procedure Laterality Date  . COLONOSCOPY  01/2013   external hemorrohids, severe melanosis, diverticulosis; Dr. Deatra Ina.  prior colonoscopy Dr. Collene Mares.  . ECTOPIC PREGNANCY SURGERY  1990  . PARTIAL HYSTERECTOMY  1989   still has 1 ovary; hx/o uterine fibroids    FAMILY HISTORY Family History  Problem Relation Age of Onset  . Hyperlipidemia Mother   . Hypertension Mother   . Diabetes Mother   . Dementia Mother   . Hyperlipidemia Father   . Hypertension Father   . Diabetes Father   . Cancer Father         Stomach  . Stomach cancer Father   . Stroke Brother        during surgery  . Dementia Brother   . Seizures Brother   . Colon cancer Brother 59       Twin  . Heart disease Neg Hx   . Esophageal cancer Neg Hx   . Rectal cancer Neg Hx     SOCIAL HISTORY Social History   Tobacco Use  . Smoking status: Former Smoker    Packs/day: 0.30    Years: 17.00    Pack years: 5.10    Types: Cigarettes    Quit date: 07/16/2016    Years since quitting: 3.9  . Smokeless tobacco: Never Used  Vaping Use  . Vaping Use: Never used  Substance Use Topics  . Alcohol use: Yes    Comment: Social  . Drug use: No         OPHTHALMIC EXAM:  Base Eye Exam    Visual Acuity (ETDRS)      Right Left   Dist cc 20/25 -2 20/25 +1   Correction: Glasses       Pupils      Pupils Dark   Right PERRL dilated   Left PERRL dilated       Visual Fields (Counting fingers)      Left Right    Full Full       Extraocular Movement      Right Left    Full Full       Neuro/Psych    Oriented x3: Yes       Dilation    Right eye: 1.0% Mydriacyl, 2.5% Phenylephrine @ 3:01 PM        Slit Lamp and Fundus Exam    External Exam      Right Left   External Normal Normal       Slit Lamp Exam      Right Left   Lids/Lashes Normal Normal   Conjunctiva/Sclera White and quiet White and quiet   Cornea Clear Clear   Anterior Chamber Deep and quiet Deep and quiet   Iris Round and reactive Round and reactive   Lens Centered posterior chamber intraocular lens 2+ Nuclear sclerosis, Cortical spokes   Anterior Vitreous Normal Normal       Fundus Exam      Right Left   Posterior Vitreous Posterior vitreous detachment Normal   Disc Normal Normal   C/D Ratio 0.5 0.5   Macula Normal Normal   Vessels Normal Normal   Periphery Horseshoe tear @ 11:30, with shallow subretinal fluid, traction on the edges, Horseshoe  tear, Vit traction, Break, shallow rd Normal          IMAGING AND PROCEDURES  Imaging and  Procedures for 07/11/20  OCT, Retina - OU - Both Eyes       Right Eye Quality was good. Scan locations included subfoveal. Progression has no prior data. Findings include normal foveal contour.   Left Eye Quality was good. Scan locations included subfoveal. Central Foveal Thickness: 253. Progression has no prior data. Findings include normal foveal contour.   Notes Vitreous debris internal to the macula right eye       Repair Retinal Detach, Photocoag - OD - Right Eye       Time Out Confirmed correct patient, procedure, site, and patient consented.   Anesthesia Topical anesthesia was used. Anesthetic medications included Proparacaine 0.5%.   Laser Information The type of laser was diode. Color was yellow. The duration in seconds was 0.03. The spot size was 390 microns. Laser power was 280. Total spots was 332.   Post-op The patient tolerated the procedure well. There were no complications. The patient received written and verbal post procedure care education.   Notes Laser retinopexy, repair small RD       Color Fundus Photography Optos - OU - Both Eyes       Right Eye Progression has no prior data. Disc findings include normal observations. Macula : normal observations. Vessels : normal observations. Periphery : lattice, degeneration, tear.   Left Eye Progression has no prior data. Disc findings include normal observations. Macula : normal observations. Vessels : normal observations. Periphery : normal observations.   Notes Posterior vitreous detachment, vitreous debris centrally                ASSESSMENT/PLAN:  Retinal detachment with single break, right 1130 meridian horseshoe retinal tear with shallow subretinal fluid with traction on the edges.  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  Pictures were used to  illuminate the discussion.  Patient stands or critical portions of laser repair of retinal detachment right eye today.      ICD-10-CM   1. Retinal detachment with single break, right  H33.011 Repair Retinal Detach, Photocoag - OD - Right Eye    Color Fundus Photography Optos - OU - Both Eyes  2. Lattice degeneration, right eye  H35.411 Color Fundus Photography Optos - OU - Both Eyes  3. Posterior vitreous detachment of right eye  H43.811 OCT, Retina - OU - Both Eyes    1.  2.  3.  Ophthalmic Meds Ordered this visit:  No orders of the defined types were placed in this encounter.      Return in about 6 weeks (around 08/22/2020) for OD, POST OP.  There are no Patient Instructions on file for this visit.   Explained the diagnoses, plan, and follow up with the patient and they expressed understanding.  Patient expressed understanding of the importance of proper follow up care.   Clent Demark Deosha Werden M.D. Diseases & Surgery of the Retina and Vitreous Retina & Diabetic [place] 07/11/20     Abbreviations: M myopia (nearsighted); A astigmatism; H hyperopia (farsighted); P presbyopia; Mrx spectacle prescription;  CTL contact lenses; OD right eye; OS left eye; OU both eyes  XT exotropia; ET esotropia; PEK punctate epithelial keratitis; PEE punctate epithelial erosions; DES dry eye syndrome; MGD meibomian gland  dysfunction; ATs artificial tears; PFAT's preservative free artificial tears; [place] nuclear sclerotic cataract; PSC posterior subcapsular cataract; ERM epi-retinal membrane; PVD posterior vitreous detachment; RD retinal detachment; DM diabetes mellitus; DR diabetic retinopathy; NPDR non-proliferative diabetic retinopathy; PDR proliferative diabetic retinopathy; CSME clinically significant macular edema; DME diabetic macular edema; dbh dot blot hemorrhages; CWS cotton wool spot; POAG primary open angle glaucoma; C/D cup-to-disc ratio; HVF humphrey visual field; GVF goldmann visual field;  OCT optical coherence tomography; IOP intraocular pressure; BRVO Branch retinal vein occlusion; CRVO central retinal vein occlusion; CRAO central retinal artery occlusion; BRAO branch retinal artery occlusion; RT retinal tear; SB scleral buckle; PPV pars plana vitrectomy; VH Vitreous hemorrhage; PRP panretinal laser photocoagulation; IVK intravitreal kenalog; VMT vitreomacular traction; MH Macular hole;  NVD neovascularization of the disc; NVE neovascularization elsewhere; AREDS age related eye disease study; ARMD age related macular degeneration; POAG primary open angle glaucoma; EBMD epithelial/anterior basement membrane dystrophy; ACIOL anterior chamber intraocular lens; IOL intraocular lens; PCIOL posterior chamber intraocular lens; Phaco/IOL phacoemulsification with intraocular lens placement; [place] photorefractive keratectomy; LASIK laser assisted in situ keratomileusis; HTN hypertension; DM diabetes mellitus; COPD chronic obstructive pulmonary disease

## 2020-07-15 ENCOUNTER — Other Ambulatory Visit: Payer: Self-pay | Admitting: Medical

## 2020-07-25 ENCOUNTER — Other Ambulatory Visit: Payer: Self-pay | Admitting: Medical

## 2020-08-21 ENCOUNTER — Encounter (INDEPENDENT_AMBULATORY_CARE_PROVIDER_SITE_OTHER): Payer: Self-pay | Admitting: Ophthalmology

## 2020-08-21 ENCOUNTER — Other Ambulatory Visit: Payer: Self-pay

## 2020-08-21 ENCOUNTER — Ambulatory Visit (INDEPENDENT_AMBULATORY_CARE_PROVIDER_SITE_OTHER): Payer: BC Managed Care – PPO | Admitting: Ophthalmology

## 2020-08-21 DIAGNOSIS — H33011 Retinal detachment with single break, right eye: Secondary | ICD-10-CM

## 2020-08-21 NOTE — Assessment & Plan Note (Signed)
Good retinopexy around large horseshoe retinal break superiorly 1130 centered OD, no new retinal breaks detected  Signs symptoms retinal detachment reviewed.

## 2020-08-21 NOTE — Progress Notes (Signed)
08/21/2020     CHIEF COMPLAINT Patient presents for Post-op Follow-up (6 Week Post Op retinopexy OD. FP/Pt states vision is stable. Pt sees occasional spots in OD. Pt states sometimes it feels like something is in OD.)   HISTORY OF PRESENT ILLNESS: Holly [name] is a 65 y.o. female who presents to the clinic today for:   HPI    Post-op Follow-up    In right eye.  Discomfort includes foreign body sensation and floaters.  Vision is stable.  I, the attending physician,  performed the HPI with the patient and updated documentation appropriately. Additional comments: 6 Week Post Op retinopexy OD. FP Pt states vision is stable. Pt sees occasional spots in OD. Pt states sometimes it feels like something is in OD.       Last edited by Elyse Jarvis on 08/21/2020  1:14 PM. (History)      Referring physician: Jac Canavan, PA-C [address] [place],  [place] [address]  HISTORICAL INFORMATION:   Selected notes from the MEDICAL RECORD NUMBER    Lab Results  Component Value Date   HGBA1C 5.9 (H) 08/20/2017     CURRENT MEDICATIONS: No current outpatient medications on file. (Ophthalmic Drugs)   No current facility-administered medications for this visit. (Ophthalmic Drugs)   Current Outpatient Medications (Other)  Medication Sig  . alendronate (FOSAMAX) 70 MG tablet TAKE 1 TABLET BY MOUTH EVERY WEEK WITH A FULL GLASS OF WATER AND ON AN EMPTY STOMACH  . calcium carbonate (CALCIUM 600) 600 MG TABS tablet Take 600 mg by mouth daily with breakfast.   . cholecalciferol (VITAMIN D3) 25 MCG (1000 UT) tablet Take 2 tablets (2,000 Units total) by mouth daily.  . cloNIDine (CATAPRES) 0.1 MG tablet TAKE 1 TABLET(0.1 MG) BY MOUTH DAILY  . FLUoxetine (PROZAC) 10 MG capsule Take 1 capsule (10 mg total) by mouth daily.  . fluticasone (FLONASE) 50 MCG/ACT nasal spray INSTILL 1 SPRAY INTO EACH NOSTRIL TWICE A DAY  . Ginkgo-Ginseng-Gotu Kol-Lecith (GINKGO BILOBA PLUS-GINSENG)  50-150-250-50 MG TABS Take 1 tablet by mouth daily.  Marland Kitchen loratadine (CLARITIN) 10 MG tablet Take 10 mg by mouth daily.   Marland Kitchen olmesartan (BENICAR) 20 MG tablet Take 1 tablet (20 mg total) by mouth daily.  . rosuvastatin (CRESTOR) 5 MG tablet 1 tablet po daily QHS  . valACYclovir (VALTREX) 1000 MG tablet Take 2 tablets (2,000 mg total) by mouth 2 (two) times daily. (Patient not taking: Reported on 12/27/2019)   No current facility-administered medications for this visit. (Other)      REVIEW OF SYSTEMS:    ALLERGIES Allergies  Allergen Reactions  . Aspirin     Upsets her stomach taking daily  . Pravachol [Pravastatin Sodium]     Leg aches  . Penicillins Rash    PAST MEDICAL HISTORY Past Medical History:  Diagnosis Date  . Chronic constipation    enrolled in a constipation study with [facility] 01/2013, on Plecanatide 6mg  daily  . Diverticulosis 01/2013   on colonoscopy  . Former smoker 2018  . H/O echocardiogram 03/2014   normal, Dr. 04/2014  . 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 gets once to twice yearly  . Hyperlipidemia   . Hypertension    controlled off medication 2019  . Menopausal disorder   . Microscopic hematuria 04/2013   urology eval, no worrisome causes.  recheck only with frank hematuria.  . Normal cardiac stress test 03/23/14   Dr.  Tollie Eth  . S/P hysterectomy    due to fibroids  . Sleep difficulties    due to menopause  . SOB (shortness of breath)    cardiac and PFT eval 03/2014  . Vitamin D deficiency   . Wears glasses    Past Surgical History:  Procedure Laterality Date  . COLONOSCOPY  01/2013   external hemorrohids, severe melanosis, diverticulosis; Dr. Deatra Ina.  prior colonoscopy Dr. Collene Mares.  . ECTOPIC PREGNANCY SURGERY  1990  . PARTIAL HYSTERECTOMY  1989   still has 1 ovary; hx/o uterine fibroids    FAMILY HISTORY Family History  Problem Relation Age of Onset  . Hyperlipidemia Mother   . Hypertension Mother   .  Diabetes Mother   . Dementia Mother   . Hyperlipidemia Father   . Hypertension Father   . Diabetes Father   . Cancer Father        Stomach  . Stomach cancer Father   . Stroke Brother        during surgery  . Dementia Brother   . Seizures Brother   . Colon cancer Brother 70       Twin  . Heart disease Neg Hx   . Esophageal cancer Neg Hx   . Rectal cancer Neg Hx     SOCIAL HISTORY Social History   Tobacco Use  . Smoking status: Former Smoker    Packs/day: 0.30    Years: 17.00    Pack years: 5.10    Types: Cigarettes    Quit date: 07/16/2016    Years since quitting: 4.1  . Smokeless tobacco: Never Used  Vaping Use  . Vaping Use: Never used  Substance Use Topics  . Alcohol use: Yes    Comment: Social  . Drug use: No         OPHTHALMIC EXAM:  Base Eye Exam    Visual Acuity (Snellen - Linear)      Right Left   Dist cc 20/25 20/25 -2   Correction: Glasses       Tonometry (Tonopen, 1:17 PM)      Right Left   Pressure 14 14       Neuro/Psych    Oriented x3: Yes   Mood/Affect: Normal       Dilation    Right eye: 1.0% Mydriacyl, 2.5% Phenylephrine @ 1:17 PM        Slit Lamp and Fundus Exam    External Exam      Right Left   External Normal Normal       Slit Lamp Exam      Right Left   Lids/Lashes Normal Normal   Conjunctiva/Sclera White and quiet White and quiet   Cornea Clear Clear   Anterior Chamber Deep and quiet Deep and quiet   Iris Round and reactive Round and reactive   Lens Centered posterior chamber intraocular lens 2+ Nuclear sclerosis, Cortical spokes   Anterior Vitreous Normal Normal       Fundus Exam      Right Left   Posterior Vitreous Posterior vitreous detachment    Disc Normal    C/D Ratio 0.5    Macula Normal    Vessels Normal    Periphery Horseshoe tear @ 11:30, with shallow subretinal fluid, traction on the edges, good pexy, Horseshoe tear, Vit traction, Break, shallow rd,, good retinopexy,, no new breaks            IMAGING AND PROCEDURES  Imaging and Procedures for 08/21/20  Color Fundus Photography Optos - OU - Both Eyes       Right Eye Progression has been stable. Disc findings include normal observations. Macula : normal observations. Vessels : normal observations. Periphery : lattice, degeneration, tear.   Left Eye Progression has been stable. Disc findings include normal observations. Macula : normal observations. Vessels : normal observations. Periphery : normal observations.   Notes Posterior vitreous detachment, vitreous debris centrally  Retinopexy detected superiorly around the retinal break OD, no new breaks                ASSESSMENT/PLAN:  Retinal detachment with single break, right Good retinopexy around large horseshoe retinal break superiorly 1130 centered OD, no new retinal breaks detected  Signs symptoms retinal detachment reviewed.      ICD-10-CM   1. Retinal detachment with single break, right  H33.011 Color Fundus Photography Optos - OU - Both Eyes    1.  6-week status post laser retinopexy right eye to repair a small retinal detachment with large break superiorly OD no new findings.  2.  3.  Ophthalmic Meds Ordered this visit:  No orders of the defined types were placed in this encounter.      Return in about 6 months (around 02/18/2021) for DILATE OU, COLOR FP.  There are no Patient Instructions on file for this visit.   Explained the diagnoses, plan, and follow up with the patient and they expressed understanding.  Patient expressed understanding of the importance of proper follow up care.   Clent Demark Nancy Arvin M.D. Diseases & Surgery of the Retina and Vitreous Retina & Diabetic [place] 08/21/20     Abbreviations: M myopia (nearsighted); A astigmatism; H hyperopia (farsighted); P presbyopia; Mrx spectacle prescription;  CTL contact lenses; OD right eye; OS left eye; OU both eyes  XT exotropia; ET esotropia; PEK punctate epithelial  keratitis; PEE punctate epithelial erosions; DES dry eye syndrome; MGD meibomian gland dysfunction; ATs artificial tears; PFAT's preservative free artificial tears; [place] nuclear sclerotic cataract; PSC posterior subcapsular cataract; ERM epi-retinal membrane; PVD posterior vitreous detachment; RD retinal detachment; DM diabetes mellitus; DR diabetic retinopathy; NPDR non-proliferative diabetic retinopathy; PDR proliferative diabetic retinopathy; CSME clinically significant macular edema; DME diabetic macular edema; dbh dot blot hemorrhages; CWS cotton wool spot; POAG primary open angle glaucoma; C/D cup-to-disc ratio; HVF humphrey visual field; GVF goldmann visual field; OCT optical coherence tomography; IOP intraocular pressure; BRVO Branch retinal vein occlusion; CRVO central retinal vein occlusion; CRAO central retinal artery occlusion; BRAO branch retinal artery occlusion; RT retinal tear; SB scleral buckle; PPV pars plana vitrectomy; VH Vitreous hemorrhage; PRP panretinal laser photocoagulation; IVK intravitreal kenalog; VMT vitreomacular traction; MH Macular hole;  NVD neovascularization of the disc; NVE neovascularization elsewhere; AREDS age related eye disease study; ARMD age related macular degeneration; POAG primary open angle glaucoma; EBMD epithelial/anterior basement membrane dystrophy; ACIOL anterior chamber intraocular lens; IOL intraocular lens; PCIOL posterior chamber intraocular lens; Phaco/IOL phacoemulsification with intraocular lens placement; [place] photorefractive keratectomy; LASIK laser assisted in situ keratomileusis; HTN hypertension; DM diabetes mellitus; COPD chronic obstructive pulmonary disease

## 2020-08-23 ENCOUNTER — Encounter: Payer: BC Managed Care – PPO | Admitting: Medical

## 2020-08-24 ENCOUNTER — Encounter (INDEPENDENT_AMBULATORY_CARE_PROVIDER_SITE_OTHER): Payer: BC Managed Care – PPO | Admitting: Ophthalmology

## 2020-08-25 ENCOUNTER — Other Ambulatory Visit: Payer: Self-pay | Admitting: Medical

## 2020-08-30 ENCOUNTER — Telehealth: Payer: Self-pay | Admitting: Medical

## 2020-08-30 ENCOUNTER — Other Ambulatory Visit: Payer: Self-pay | Admitting: Medical

## 2020-08-30 MED ORDER — OLMESARTAN MEDOXOMIL-HCTZ 40-12.5 MG PO TABS: 1.0000 | ORAL_TABLET | Freq: Every day | ORAL | 1 refills | Status: DC

## 2020-08-30 NOTE — Telephone Encounter (Signed)
I changed her medicine to Benicar HCT so this is a higher dose with the fluid pill added.  This should work a lot better.  Schedule blood pressure follow-up visit in 3-4 weeks

## 2020-08-30 NOTE — Telephone Encounter (Signed)
Pt called and states that she was recently placed on BP meds. She states that her bp readings are still high. She states that they are around 160/90-100. Pt can be reached at [phone] and uses [facility] on [address].

## 2020-08-30 NOTE — Telephone Encounter (Signed)
Patient informed and already has an appointment scheduled for February.

## 2020-09-10 ENCOUNTER — Other Ambulatory Visit: Payer: Self-pay | Admitting: Medical

## 2020-09-20 ENCOUNTER — Encounter: Payer: BC Managed Care – PPO | Admitting: Medical

## 2020-10-10 ENCOUNTER — Encounter: Payer: Self-pay | Admitting: Medical

## 2020-10-10 ENCOUNTER — Ambulatory Visit: Payer: BC Managed Care – PPO | Admitting: Medical

## 2020-10-10 ENCOUNTER — Other Ambulatory Visit: Payer: Self-pay

## 2020-10-10 VITALS — BP 122/84 | HR 73 | Ht 65.0 in | Wt 162.4 lb

## 2020-10-10 DIAGNOSIS — Z0000 Encounter for general adult medical examination without abnormal findings: Secondary | ICD-10-CM | POA: Diagnosis not present

## 2020-10-10 DIAGNOSIS — E559 Vitamin D deficiency, unspecified: Secondary | ICD-10-CM

## 2020-10-10 DIAGNOSIS — N951 Menopausal and female climacteric states: Secondary | ICD-10-CM

## 2020-10-10 DIAGNOSIS — E2839 Other primary ovarian failure: Secondary | ICD-10-CM

## 2020-10-10 DIAGNOSIS — Z90710 Acquired absence of both cervix and uterus: Secondary | ICD-10-CM

## 2020-10-10 DIAGNOSIS — E785 Hyperlipidemia, unspecified: Secondary | ICD-10-CM | POA: Diagnosis not present

## 2020-10-10 DIAGNOSIS — M810 Age-related osteoporosis without current pathological fracture: Secondary | ICD-10-CM

## 2020-10-10 DIAGNOSIS — Z79899 Other long term (current) drug therapy: Secondary | ICD-10-CM | POA: Diagnosis not present

## 2020-10-10 DIAGNOSIS — Z87891 Personal history of nicotine dependence: Secondary | ICD-10-CM

## 2020-10-10 DIAGNOSIS — Z1159 Encounter for screening for other viral diseases: Secondary | ICD-10-CM

## 2020-10-10 DIAGNOSIS — G8929 Other chronic pain: Secondary | ICD-10-CM

## 2020-10-10 DIAGNOSIS — R413 Other amnesia: Secondary | ICD-10-CM

## 2020-10-10 DIAGNOSIS — K5790 Diverticulosis of intestine, part unspecified, without perforation or abscess without bleeding: Secondary | ICD-10-CM

## 2020-10-10 DIAGNOSIS — K5904 Chronic idiopathic constipation: Secondary | ICD-10-CM

## 2020-10-10 DIAGNOSIS — M79602 Pain in left arm: Secondary | ICD-10-CM

## 2020-10-10 DIAGNOSIS — M5412 Radiculopathy, cervical region: Secondary | ICD-10-CM

## 2020-10-10 DIAGNOSIS — M25512 Pain in left shoulder: Secondary | ICD-10-CM

## 2020-10-10 DIAGNOSIS — Z800 Family history of malignant neoplasm of digestive organs: Secondary | ICD-10-CM

## 2020-10-10 LAB — POCT URINALYSIS DIP (PROADVANTAGE DEVICE)
Glucose, UA: NEGATIVE mg/dL
Ketones, POC UA: NEGATIVE mg/dL
Nitrite, UA: NEGATIVE
Specific Gravity, Urine: 1.015
Urobilinogen, Ur: 0.2
pH, UA: 7 (ref 5.0–8.0)

## 2020-10-10 NOTE — Progress Notes (Signed)
Subjective:   HPI  Holly [name] is a 65 y.o. female who presents for Chief Complaint  Patient presents with   Annual Exam    Physical with fasting labs     Patient Care Team: Jermari Tamargo, Leward Quan as PCP - General (Family Medicine) Sees dentist Sees eye doctor Dr. Owens Loffler, GI  Concerns: HTN -hypertension-compliant with medication without complaint.  No chest pain no palpitations no edema  Hyperlipidemia-compliant with medication without complaint  Overall been doing well.  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  She recently moved in with her mom to help out with her mother  Vitamin D deficiency-she continues on supplement daily 2000 units daily  Osteopenia-she continues on Fosamax  Reviewed their medical, surgical, family, social, medication, and allergy history and updated chart as appropriate.   Review of Systems Constitutional: -fever, -chills, -sweats, -unexpected weight change, -decreased appetite, -fatigue Allergy: -sneezing, -itching, -congestion Dermatology: -changing moles, --rash, -lumps ENT: -runny nose, -ear pain, -sore throat, -hoarseness, -sinus pain, -teeth pain, - ringing in ears, -hearing loss, -nosebleeds Cardiology: -chest pain, -palpitations, -swelling, -difficulty breathing when lying flat, -waking up short of breath Respiratory: -cough, -shortness of breath, -difficulty breathing with exercise or exertion, -wheezing, -coughing up blood Gastroenterology: -abdominal pain, -nausea, -vomiting, -diarrhea, -constipation, -blood in stool, -changes in bowel movement, -difficulty swallowing or eating Hematology: -bleeding, -bruising  Musculoskeletal: -joint aches, -muscle aches, -joint swelling, -back pain, -neck pain, -cramping, -changes in gait Ophthalmology: denies vision changes, eye redness, itching, discharge Urology: -burning with urination, -difficulty urinating, -blood in urine, -urinary frequency, -urgency,  -incontinence Neurology: -headache, -weakness, -tingling, -numbness, +memory at times a concern, -falls, -dizziness Psychology: -depressed mood, -agitation, -sleep problems Breast/gyn: -breast tendnerss, -discharge, -lumps, -vaginal discharge,- irregular periods, -heavy periods     Objective:  BP 122/84    Pulse 73    Ht 5\' 5"  (1.651 m)    Wt 162 lb 6.4 oz (73.7 kg)    SpO2 96%    BMI 27.02 kg/m   General appearance: alert, no distress, WD/WN, African American female Skin: right lateral upper face just lateral to eyelid with raised round brown 59mm diameter papular lesion unchanged for years. HEENT: normocephalic, conjunctiva/corneas normal, sclerae anicteric, PERRLA, EOMi, nares patent, no discharge or erythema, pharynx normal Oral cavity: MMM, tongue normal, teeth normal Neck: supple, no lymphadenopathy, no thyromegaly, no masses, normal ROM, no bruits Chest: non tender, normal shape and expansion Heart: RRR, normal S1, S2, no murmurs Lungs: CTA bilaterally, no wheezes, rhonchi, or rales Abdomen: +bs, soft, non tender, non distended, no masses, no hepatomegaly, no splenomegaly, no bruits Back: non tender, normal ROM, no scoliosis Musculoskeletal: upper extremities non tender, no obvious deformity, normal ROM throughout, lower extremities non tender, no obvious deformity, normal ROM throughout Extremities: no edema, no cyanosis, no clubbing Pulses: 2+ symmetric, upper and lower extremities, normal cap refill Neurological: alert, oriented x 3, CN2-12 intact, strength normal upper extremities and lower extremities, sensation normal throughout, DTRs 2+ throughout, no cerebellar signs, gait normal Psychiatric: normal affect, behavior normal, pleasant  Breast/gyn/rectal - deferred at her request today    Assessment and Plan :   Encounter Diagnoses  Name Primary?   Encounter for health maintenance examination in adult Yes   Vitamin D deficiency    Hyperlipidemia, unspecified  hyperlipidemia type    High risk medication use    Chronic left shoulder pain    Chronic idiopathic constipation    Diverticulosis    Estrogen deficiency  Family history of colon cancer    Former smoker    Left cervical radiculopathy    Left arm pain    Menopausal state    Osteoporosis, unspecified osteoporosis type, unspecified pathological fracture presence    S/P hysterectomy    Routine general medical examination at a health care facility    Encounter for hepatitis C screening test for low risk patient    Memory change     Today you had a preventative care visit or wellness visit.    Topics today may have included healthy lifestyle, diet, exercise, preventative care, vaccinations, sick and well care, proper use of emergency dept and after hours care, as well as other concerns.     Recommendations: Continue to return yearly for your annual wellness and preventative care visits.  This gives [place] a chance to discuss healthy lifestyle, exercise, vaccinations, review your chart record, and perform screenings where appropriate.  I recommend you see your eye doctor yearly for routine vision care.  I recommend you see your dentist yearly for routine dental care including hygiene visits twice yearly.   Vaccination recommendations were reviewed Your tetanus is up to date but you will need booster 2024 Your shingles vaccine is up to date Your covid vaccine is up to date Your flu shot is up to date   Screening for cancer: Breast cancer screening: You should perform a self breast exam monthly.   We reviewed recommendations for regular mammograms and breast cancer screening. Mammogram from 02/2020 reviewed, normal  Colon cancer screening:  Reviewed 11/2019 colonoscopy with Dr. Ardis Hughs, polyps included Tubular Adenoma, repeat 5 years   You have had a hysterectomy and no longer need pap smear test.  We will continue pelvic exams periodically.  Skin cancer  screening: Check your skin regularly for new changes, growing lesions, or other lesions of concern Come in for evaluation if you have skin lesions of concern.  Lung cancer screening: If you have a greater than 30 pack year history of tobacco use, then you qualify for lung cancer screening with a chest CT scan  We currently don't have screenings for other cancers besides breast, cervical, colon, and lung cancers.  If you have a strong family history of cancer or have other cancer screening concerns, please let me know.    Bone health: Get at least 150 minutes of aerobic exercise weekly Get weight bearing exercise at least once weekly Osteopenia with improvement in bone density on 08/2019 exam.  Plan to repeat bone density test 08/2021.    Fosamax started 07/2017.  Continue current medication and recheck bone density 08/2021.   Heart health: Get at least 150 minutes of aerobic exercise weekly Limit alcohol It is important to maintain a healthy blood pressure and healthy cholesterol numbers   Separate significant issues discussed: Hypertension-blood pressure is at goal today, continue current medication  Hyperlipidemia-fasting labs today, continue statin  Vitamin D deficiency-updated labs today, continue supplement 2000 units daily  Chronic shoulder arm and neck pain-she saw orthopedics this past year.  Was advised with physical therapy but her co-pay was $60 per visit so she did not do it.  I gave her some exercises to try today.  She will begin home exercise program  Catlyn was seen today for annual exam.  Diagnoses and all orders for this visit:  Encounter for health maintenance examination in adult -     Comprehensive metabolic panel -     CBC -     Lipid panel -  VITAMIN D 25 Hydroxy (Vit-D Deficiency, Fractures) -     POCT Urinalysis DIP (Proadvantage Device) -     Hepatitis C antibody -     HIV Antibody (routine testing w rflx)  Vitamin D deficiency -     VITAMIN  D 25 Hydroxy (Vit-D Deficiency, Fractures)  Hyperlipidemia, unspecified hyperlipidemia type -     Comprehensive metabolic panel -     Lipid panel  High risk medication use  Chronic left shoulder pain  Chronic idiopathic constipation  Diverticulosis  Estrogen deficiency  Family history of colon cancer  Former smoker  Left cervical radiculopathy  Left arm pain  Menopausal state  Osteoporosis, unspecified osteoporosis type, unspecified pathological fracture presence  S/P hysterectomy  Routine general medical examination at a health care facility  Encounter for hepatitis C screening test for low risk patient -     Hepatitis C antibody  Memory change -     Ambulatory referral to Neurology     Follow-up pending labs, yearly for physical

## 2020-10-11 LAB — COMPREHENSIVE METABOLIC PANEL
ALT: 10 IU/L (ref 0–32)
AST: 19 IU/L (ref 0–40)
Albumin/Globulin Ratio: 1.8 (ref 1.2–2.2)
Albumin: 4.2 g/dL (ref 3.8–4.8)
Alkaline Phosphatase: 69 IU/L (ref 44–121)
BUN/Creatinine Ratio: 15 (ref 12–28)
BUN: 10 mg/dL (ref 8–27)
Bilirubin Total: 0.4 mg/dL (ref 0.0–1.2)
CO2: 24 mmol/L (ref 20–29)
Calcium: 9.2 mg/dL (ref 8.7–10.3)
Chloride: 104 mmol/L (ref 96–106)
Creatinine, Ser: 0.68 mg/dL (ref 0.57–1.00)
GFR calc Af Amer: 108 mL/min/{1.73_m2} (ref >59)
GFR calc non Af Amer: 94 mL/min/{1.73_m2} (ref >59)
Globulin, Total: 2.3 g/dL (ref 1.5–4.5)
Glucose: 91 mg/dL (ref 65–99)
Potassium: 4.5 mmol/L (ref 3.5–5.2)
Sodium: 142 mmol/L (ref 134–144)
Total Protein: 6.5 g/dL (ref 6.0–8.5)

## 2020-10-11 LAB — VITAMIN D 25 HYDROXY (VIT D DEFICIENCY, FRACTURES): Vit D, 25-Hydroxy: 42.4 ng/mL (ref 30.0–100.0)

## 2020-10-11 LAB — LIPID PANEL
Chol/HDL Ratio: 3.1 ratio (ref 0.0–4.4)
Cholesterol, Total: 143 mg/dL (ref 100–199)
HDL: 46 mg/dL (ref >39)
LDL Chol Calc (NIH): 77 mg/dL (ref 0–99)
Triglycerides: 108 mg/dL (ref 0–149)
VLDL Cholesterol Cal: 20 mg/dL (ref 5–40)

## 2020-10-11 LAB — HIV ANTIBODY (ROUTINE TESTING W REFLEX): HIV Screen 4th Generation wRfx: NONREACTIVE

## 2020-10-11 LAB — CBC
Hematocrit: 39 % (ref 34.0–46.6)
Hemoglobin: 12.7 g/dL (ref 11.1–15.9)
MCH: 28.8 pg (ref 26.6–33.0)
MCHC: 32.6 g/dL (ref 31.5–35.7)
MCV: 88 fL (ref 79–97)
Platelets: 260 10*3/uL (ref 150–450)
RBC: 4.41 x10E6/uL (ref 3.77–5.28)
RDW: 12.4 % (ref 11.7–15.4)
WBC: 7.4 10*3/uL (ref 3.4–10.8)

## 2020-10-11 LAB — HEPATITIS C ANTIBODY: Hep C Virus Ab: <0.1 s/co ratio (ref 0.0–0.9)

## 2020-10-12 ENCOUNTER — Encounter: Payer: Self-pay | Admitting: Neurology

## 2020-10-12 ENCOUNTER — Other Ambulatory Visit: Payer: Self-pay | Admitting: Medical

## 2020-10-12 MED ORDER — CLONIDINE HCL 0.1 MG PO TABS: ORAL_TABLET | ORAL | 3 refills | Status: DC

## 2020-10-12 MED ORDER — VITAMIN D 25 MCG (1000 UNIT) PO TABS: 2000.0000 [IU] | ORAL_TABLET | Freq: Every day | ORAL | 3 refills | Status: DC

## 2020-10-12 MED ORDER — OLMESARTAN MEDOXOMIL-HCTZ 40-12.5 MG PO TABS: 1.0000 | ORAL_TABLET | Freq: Every day | ORAL | 3 refills | Status: DC

## 2020-10-22 ENCOUNTER — Other Ambulatory Visit: Payer: Self-pay | Admitting: Medical

## 2020-10-30 ENCOUNTER — Telehealth: Payer: Self-pay | Admitting: Medical

## 2020-10-30 ENCOUNTER — Other Ambulatory Visit: Payer: Self-pay

## 2020-10-30 NOTE — Telephone Encounter (Signed)
Noted  

## 2020-10-30 NOTE — Telephone Encounter (Signed)
Pt called and states that she is not taking clonidine. Audelia Acton switched her to Benicar HCT. Pharmacy called and said a RX was ready for the medication she no longer takes.

## 2020-11-25 ENCOUNTER — Other Ambulatory Visit: Payer: Self-pay | Admitting: Medical

## 2020-12-07 ENCOUNTER — Other Ambulatory Visit: Payer: Self-pay | Admitting: Medical

## 2020-12-10 ENCOUNTER — Other Ambulatory Visit: Payer: Self-pay | Admitting: Medical

## 2020-12-21 ENCOUNTER — Telehealth (INDEPENDENT_AMBULATORY_CARE_PROVIDER_SITE_OTHER): Payer: BC Managed Care – PPO | Admitting: Family Medicine

## 2020-12-21 ENCOUNTER — Other Ambulatory Visit: Payer: Self-pay

## 2020-12-21 ENCOUNTER — Telehealth: Payer: Self-pay | Admitting: Medical

## 2020-12-21 ENCOUNTER — Encounter: Payer: Self-pay | Admitting: Family Medicine

## 2020-12-21 VITALS — BP 138/88 | Temp 98.5°F | Ht 63.5 in | Wt 167.0 lb

## 2020-12-21 DIAGNOSIS — J309 Allergic rhinitis, unspecified: Secondary | ICD-10-CM

## 2020-12-21 DIAGNOSIS — J069 Acute upper respiratory infection, unspecified: Secondary | ICD-10-CM

## 2020-12-21 NOTE — Telephone Encounter (Signed)
forwarding

## 2020-12-21 NOTE — Progress Notes (Signed)
   Subjective:    Patient ID: Holly [name], female    DOB: Jun 03, 1956, 65 y.o.   MRN: [id number]  HPI Documentation for virtual audio and video telecommunications through [facility] encounter: The patient was located at home. 2 patient identifiers used.  The provider was located in the office. The patient did consent to this visit and is aware of possible charges through their insurance for this visit. The other persons participating in this telemedicine service were none. Time spent on call was 5 minutes and in review of previous records >20 minutes total for counseling and coordination of care. This virtual service is not related to other E/M service within previous 7 days. She complains of a 3-day history of slight headache, rhinorrhea, chest congestion with cough and postnasal drainage.  No fever, chills, sore throat, earache or sneezing.  She does have underlying allergies.  She does use a nasal steroid spray that she discussed refilled and also the generic of loratadine.  Review of Systems     Objective:   Physical Exam  Alert and in no distress otherwise not examined.  Her voice sounds normal.      Assessment & Plan:  Allergic rhinitis, unspecified seasonality, unspecified trigger  Viral upper respiratory tract infection Recommend she do a home COVID test which she plans to do.  Also recommend she switch to Allegra but continue on her Flonase since she just got a refill on that.  Tylenol for pain relief.  She will call with results of the COVID if it is positive.  She was comfortable with that.

## 2020-12-21 NOTE — Telephone Encounter (Signed)
Think I wanted her to switch to a different antihistamine and basically treat her symptoms.

## 2020-12-21 NOTE — Telephone Encounter (Signed)
LVM for pt advising of below message and to call back if she had any more questions. Holly [name]

## 2020-12-21 NOTE — Telephone Encounter (Signed)
Pt called and states that her at home covid test was negative  She wants to know what she needs to do next

## 2021-01-08 ENCOUNTER — Other Ambulatory Visit: Payer: Self-pay | Admitting: Family Medicine

## 2021-01-08 NOTE — Telephone Encounter (Signed)
[facility] is requesting to fill pt prozac . Please advise. KH °

## 2021-01-09 ENCOUNTER — Other Ambulatory Visit: Payer: Self-pay

## 2021-01-09 ENCOUNTER — Ambulatory Visit: Payer: BC Managed Care – PPO | Admitting: Medical

## 2021-01-09 ENCOUNTER — Encounter: Payer: Self-pay | Admitting: Medical

## 2021-01-09 VITALS — BP 122/70 | HR 81 | Ht 65.0 in | Wt 165.0 lb

## 2021-01-09 DIAGNOSIS — J069 Acute upper respiratory infection, unspecified: Secondary | ICD-10-CM | POA: Diagnosis not present

## 2021-01-09 DIAGNOSIS — R059 Cough, unspecified: Secondary | ICD-10-CM | POA: Insufficient documentation

## 2021-01-09 DIAGNOSIS — Z87891 Personal history of nicotine dependence: Secondary | ICD-10-CM | POA: Diagnosis not present

## 2021-01-09 MED ORDER — PREDNISONE 20 MG PO TABS: 40.0000 mg | ORAL_TABLET | Freq: Every day | ORAL | 0 refills | Status: DC

## 2021-01-09 MED ORDER — BENZONATATE 100 MG PO CAPS: 100.0000 mg | ORAL_CAPSULE | Freq: Three times a day (TID) | ORAL | 0 refills | Status: DC | PRN

## 2021-01-09 NOTE — Progress Notes (Signed)
Subjective:  Holly [name] is a 65 y.o. female who presents for Chief Complaint  Patient presents with  . Cough    Tested negative. Had cough for weeks      Here for ongoing cough.  She did a virtual visit with Dr. Redmond School on May 5 for allergies and cough.  At that time she changed to Claritin and began using Delsym over-the-counter.  The congestion and all went away but her cough has lingered.  Despite trying other cough medicines the cough continues.  She denies wheezing, shortness of breath, fever, sore throat, head congestion, no belly pain or burping or acid reflux.  No nausea or vomiting.  No swelling in the legs.  She had 2 negative COVID test within the last few weeks.  No chest pain.  No other aggravating or relieving factors. No other complaint.  No other aggravating or relieving factors.    No other c/o.   Past Medical History:  Diagnosis Date  . Chronic constipation    enrolled in a constipation study with [place] 01/2013, on Plecanatide 6mg  daily  . Diverticulosis 01/2013   on colonoscopy  . Former smoker 2018  . H/O echocardiogram 03/2014   normal, Dr. Tollie Eth  . 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 gets once to twice yearly  . Hyperlipidemia   . Hypertension    controlled off medication 2019  . Menopausal disorder   . Microscopic hematuria 04/2013   urology eval, no worrisome causes.  recheck only with frank hematuria.  . Normal cardiac stress test 03/23/14   Dr. Tollie Eth  . S/P hysterectomy    due to fibroids  . Sleep difficulties    due to menopause  . SOB (shortness of breath)    cardiac and PFT eval 03/2014  . Vitamin D deficiency   . Wears glasses    Current Outpatient Medications on File Prior to Visit  Medication Sig Dispense Refill  . alendronate (FOSAMAX) 70 MG tablet TAKE 1 TABLET BY MOUTH EVERY WEEK WITH A FULL GLASS OF WATER AND ON AN EMPTY STOMACH 12 tablet 4  . calcium carbonate (OS-CAL) 600 MG TABS tablet  Take 600 mg by mouth daily with breakfast.     . cholecalciferol (VITAMIN D3) 25 MCG (1000 UNIT) tablet Take 2 tablets (2,000 Units total) by mouth daily. 180 tablet 3  . FLUoxetine (PROZAC) 10 MG capsule TAKE 1 CAPSULE(10 MG) BY MOUTH DAILY 90 capsule 0  . fluticasone (FLONASE) 50 MCG/ACT nasal spray SHAKE LIQUID AND USE 1 SPRAY IN EACH NOSTRIL TWICE DAILY 48 g 0  . loratadine (CLARITIN) 10 MG tablet Take 10 mg by mouth daily.     Marland Kitchen olmesartan-hydrochlorothiazide (BENICAR HCT) 40-12.5 MG tablet Take 1 tablet by mouth daily. 90 tablet 3  . rosuvastatin (CRESTOR) 5 MG tablet TAKE 1 TABLET BY MOUTH EVERY NIGHT AT BEDTIME 90 tablet 0  . Ginkgo-Ginseng-Gotu Kol-Lecith (GINKGO BILOBA PLUS-GINSENG) 50-150-250-50 MG TABS Take 1 tablet by mouth daily. (Patient not taking: No sig reported) 30 tablet 2   No current facility-administered medications on file prior to visit.     The following portions of the patient's history were reviewed and updated as appropriate: allergies, current medications, past family history, past medical history, past social history, past surgical history and problem list.  ROS Otherwise as in subjective above  Objective: BP 122/70   Pulse 81   Ht 5\' 5"  (1.651 m)   Wt 165 lb (  74.8 kg)   SpO2 97%   BMI 27.46 kg/m   General appearance: alert, no distress, well developed, well nourished HEENT: normocephalic, sclerae anicteric, conjunctiva pink and moist, TMs pearly, nares patent, no discharge or erythema, pharynx normal Oral cavity: MMM, no lesions Neck: supple, no lymphadenopathy, no thyromegaly, no masses, no JVD Heart: RRR, normal S1, S2, no murmurs Lungs: CTA bilaterally, no wheezes, rhonchi, or rales Abdomen: +bs, soft, non tender, non distended, no masses, no hepatomegaly, no splenomegaly Pulses: 2+ radial pulses, 2+ pedal pulses, normal cap refill Ext: no edema   Assessment: Encounter Diagnoses  Name Primary?  . Cough Yes  . Former smoker   . Recent upper  respiratory tract infection      Plan: Exam findings today are normal.  We discussed her symptoms would suggest residual inflammation and cough from recent allergy flare or possible recent respiratory tract infection.  PFT reviewed today  I will have her do the medicine as below short-term to reduce inflammation and to help the cough resolve  I advised if not completely within 2 weeks can call back and we will schedule her for a chest x-ray.  She agrees with plan and voices understanding  Holly [name] was seen today for cough.  Diagnoses and all orders for this visit:  Cough -     Spirometry with Graph  Former smoker -     Spirometry with Graph  Recent upper respiratory tract infection  Other orders -     predniSONE (DELTASONE) 20 MG tablet; Take 2 tablets (40 mg total) by mouth daily with breakfast. -     benzonatate (TESSALON) 100 MG capsule; Take 1 capsule (100 mg total) by mouth 3 (three) times daily as needed for cough.    Follow up: 2 weeks

## 2021-01-22 ENCOUNTER — Encounter: Payer: Self-pay | Admitting: Neurology

## 2021-01-22 ENCOUNTER — Ambulatory Visit (INDEPENDENT_AMBULATORY_CARE_PROVIDER_SITE_OTHER): Payer: BC Managed Care – PPO | Admitting: Neurology

## 2021-01-22 ENCOUNTER — Other Ambulatory Visit (INDEPENDENT_AMBULATORY_CARE_PROVIDER_SITE_OTHER): Payer: BC Managed Care – PPO

## 2021-01-22 ENCOUNTER — Other Ambulatory Visit: Payer: Self-pay

## 2021-01-22 ENCOUNTER — Telehealth: Payer: Self-pay

## 2021-01-22 VITALS — BP 128/79 | HR 61 | Ht 65.5 in | Wt 163.6 lb

## 2021-01-22 DIAGNOSIS — R413 Other amnesia: Secondary | ICD-10-CM | POA: Diagnosis not present

## 2021-01-22 LAB — TSH: TSH: 1.6 u[IU]/mL (ref 0.35–4.50)

## 2021-01-22 LAB — VITAMIN B12: Vitamin B-12: 343 pg/mL (ref 211–911)
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
## 2021-01-22 NOTE — Telephone Encounter (Signed)
-----   Message from Cameron Sprang, MD sent at 01/22/2021  2:56 PM EDT ----- Pls let her know bloodwork is normal, thanks

## 2021-01-22 NOTE — Patient Instructions (Addendum)
It was a pleasure to see you today at our office.   Recommendations:  Neurocognitive evaluation at our office MRI of the brain, [facility] will call you to arrange you appointment Check B12 and TSH at the lab Follow up when results become available   RECOMMENDATIONS FOR ALL PATIENTS WITH MEMORY PROBLEMS: 1. Continue to exercise (Recommend 30 minutes of walking everyday, or 3 hours every week) 2. Increase social interactions - continue going to [place] and enjoy social gatherings with friends and family 3. Eat healthy, avoid fried foods and eat more fruits and vegetables 4. Maintain adequate blood pressure, blood sugar, and blood cholesterol level. Reducing the risk of stroke and cardiovascular disease also helps promoting better memory. 5. Avoid stressful situations. Live a simple life and avoid aggravations. Organize your time and prepare for the next day in anticipation. 6. Sleep well, avoid any interruptions of sleep and avoid any distractions in the bedroom that may interfere with adequate sleep quality 7. Avoid sugar, avoid sweets as there is a strong link between excessive sugar intake, diabetes, and cognitive impairment We discussed the Mediterranean diet, which has been shown to help patients reduce the risk of progressive memory disorders and reduces cardiovascular risk. This includes eating fish, eat fruits and green leafy vegetables, nuts like almonds and hazelnuts, walnuts, and also use olive oil. Avoid fast foods and fried foods as much as possible. Avoid sweets and sugar as sugar use has been linked to worsening of memory function.  There is always a concern of gradual progression of memory problems. If this is the case, then we may need to adjust level of care according to patient needs. Support, both to the patient and caregiver, should then be put into place.      You have been referred for a neuropsychological evaluation (i.e., evaluation of memory and thinking  abilities). Please bring someone with you to this appointment if possible, as it is helpful for the doctor to hear from both you and another adult who knows you well. Please bring eyeglasses and hearing aids if you wear them.    The evaluation will take approximately 3 hours and has two parts:   . The first part is a clinical interview with the neuropsychologist (Dr. Melvyn Novas or Dr. Nicole Kindred). During the interview, the neuropsychologist will speak with you and the individual you brought to the appointment.    . The second part of the evaluation is testing with the doctor's technician Hinton Dyer or Maudie Mercury). During the testing, the technician will ask you to remember different types of material, solve problems, and answer some questionnaires. Your family member will not be present for this portion of the evaluation.   Please note: We must reserve several hours of the neuropsychologist's time and the psychometrician's time for your evaluation appointment. As such, there is a No-Show fee of $100. If you are unable to attend any of your appointments, please contact our office as soon as possible to reschedule.     RECOMMENDATIONS FOR ALL PATIENTS WITH MEMORY PROBLEMS: 1. Continue to exercise (Recommend 30 minutes of walking everyday, or 3 hours every week) 2. Increase social interactions - continue going to [place] and enjoy social gatherings with friends and family 3. Eat healthy, avoid fried foods and eat more fruits and vegetables 4. Maintain adequate blood pressure, blood sugar, and blood cholesterol level. Reducing the risk of stroke and cardiovascular disease also helps promoting better memory. 5. Avoid stressful situations. Live a simple life and avoid aggravations. Organize  your time and prepare for the next day in anticipation. 6. Sleep well, avoid any interruptions of sleep and avoid any distractions in the bedroom that may interfere with adequate sleep quality 7. Avoid sugar, avoid sweets as there is a  strong link between excessive sugar intake, diabetes, and cognitive impairment We discussed the Mediterranean diet, which has been shown to help patients reduce the risk of progressive memory disorders and reduces cardiovascular risk. This includes eating fish, eat fruits and green leafy vegetables, nuts like almonds and hazelnuts, walnuts, and also use olive oil. Avoid fast foods and fried foods as much as possible. Avoid sweets and sugar as sugar use has been linked to worsening of memory function.    Mediterranean Diet A Mediterranean diet refers to food and lifestyle choices that are based on the traditions of countries located on the [employer]. This way of eating has been shown to help prevent certain conditions and improve outcomes for people who have chronic diseases, like kidney disease and heart disease. What are tips for following this plan? Lifestyle   Cook and eat meals together with your family, when possible.  Drink enough fluid to keep your urine clear or pale yellow.  Be physically active every day. This includes:  Aerobic exercise like running or swimming.  Leisure activities like gardening, walking, or housework.  Get 7-8 hours of sleep each night.  If recommended by your health care provider, drink red wine in moderation. This means 1 glass a day for nonpregnant women and 2 glasses a day for men. A glass of wine equals 5 oz (150 mL). Reading food labels   Check the serving size of packaged foods. For foods such as rice and pasta, the serving size refers to the amount of cooked product, not dry.  Check the total fat in packaged foods. Avoid foods that have saturated fat or trans fats.  Check the ingredients list for added sugars, such as corn syrup. Shopping   At the grocery store, buy most of your food from the areas near the walls of the store. This includes:  Fresh fruits and vegetables (produce).  Grains, beans, nuts, and seeds. Some of these may be  available in unpackaged forms or large amounts (in bulk).  Fresh seafood.  Poultry and eggs.  Low-fat dairy products.  Buy whole ingredients instead of prepackaged foods.  Buy fresh fruits and vegetables in-season from local farmers markets.  Buy frozen fruits and vegetables in resealable bags.  If you do not have access to quality fresh seafood, buy precooked frozen shrimp or canned fish, such as tuna, salmon, or sardines.  Buy small amounts of raw or cooked vegetables, salads, or olives from the deli or salad bar at your store.  Stock your pantry so you always have certain foods on hand, such as olive oil, canned tuna, canned tomatoes, rice, pasta, and beans. Cooking   Cook foods with extra-virgin olive oil instead of using butter or other vegetable oils.  Have meat as a side dish, and have vegetables or grains as your main dish. This means having meat in small portions or adding small amounts of meat to foods like pasta or stew.  Use beans or vegetables instead of meat in common dishes like chili or lasagna.  Experiment with different cooking methods. Try roasting or broiling vegetables instead of steaming or sauteing them.  Add frozen vegetables to soups, stews, pasta, or rice.  Add nuts or seeds for added healthy fat at each meal.  You can add these to yogurt, salads, or vegetable dishes.  Marinate fish or vegetables using olive oil, lemon juice, garlic, and fresh herbs. Meal planning   Plan to eat 1 vegetarian meal one day each week. Try to work up to 2 vegetarian meals, if possible.  Eat seafood 2 or more times a week.  Have healthy snacks readily available, such as:  Vegetable sticks with hummus.  Greek yogurt.  Fruit and nut trail mix.  Eat balanced meals throughout the week. This includes:  Fruit: 2-3 servings a day  Vegetables: 4-5 servings a day  Low-fat dairy: 2 servings a day  Fish, poultry, or lean meat: 1 serving a day  Beans and legumes: 2 or  more servings a week  Nuts and seeds: 1-2 servings a day  Whole grains: 6-8 servings a day  Extra-virgin olive oil: 3-4 servings a day  Limit red meat and sweets to only a few servings a month What are my food choices?  Mediterranean diet  Recommended  Grains: Whole-grain pasta. Brown rice. Bulgar wheat. Polenta. Couscous. Whole-wheat bread. [place].  Vegetables: Artichokes. Beets. Broccoli. Cabbage. Carrots. Eggplant. Green beans. Chard. Kale. Spinach. Onions. Leeks. Peas. Squash. Tomatoes. Peppers. Radishes.  Fruits: Apples. Apricots. Avocado. Berries. Bananas. Cherries. Dates. Figs. Grapes. Lemons. Melon. Oranges. Peaches. Plums. Pomegranate.  Meats and other protein foods: Beans. Almonds. Sunflower seeds. Pine nuts. Peanuts. [place]. Salmon. Scallops. Shrimp. [place]. Tilapia. Clams. Oysters. Eggs.  Dairy: Low-fat milk. Cheese. Greek yogurt.  Beverages: Water. Red wine. Herbal tea.  Fats and oils: Extra virgin olive oil. Avocado oil. Grape seed oil.  Sweets and desserts: [place] yogurt with honey. Baked apples. Poached pears. Trail mix.  Seasoning and other foods: Basil. Cilantro. Coriander. Cumin. Mint. Parsley. Sage. Rosemary. Tarragon. Garlic. Oregano. Thyme. Pepper. Balsalmic vinegar. Tahini. Hummus. Tomato sauce. Olives. Mushrooms.  Limit these  Grains: Prepackaged pasta or rice dishes. Prepackaged cereal with added sugar.  Vegetables: Deep fried potatoes (french fries).  Fruits: Fruit canned in syrup.  Meats and other protein foods: Beef. Pork. Lamb. Poultry with skin. Hot dogs. Berniece Salines.  Dairy: Ice cream. Sour cream. Whole milk.  Beverages: Juice. Sugar-sweetened soft drinks. Beer. Liquor and spirits.  Fats and oils: Butter. Canola oil. Vegetable oil. Beef fat (tallow). Lard.  Sweets and desserts: Cookies. Cakes. Pies. Candy.  Seasoning and other foods: Mayonnaise. Premade sauces and marinades.  The items listed may not be a complete list. Talk with your  dietitian about what dietary choices are right for you. Summary  The Mediterranean diet includes both food and lifestyle choices.  Eat a variety of fresh fruits and vegetables, beans, nuts, seeds, and whole grains.  Limit the amount of red meat and sweets that you eat.  Talk with your health care provider about whether it is safe for you to drink red wine in moderation. This means 1 glass a day for nonpregnant women and 2 glasses a day for men. A glass of wine equals 5 oz (150 mL). This information is not intended to replace advice given to you by your health care provider. Make sure you discuss any questions you have with your health care provider. Document Released: 03/28/2016 Document Revised: 04/30/2016 Document Reviewed: 03/28/2016 Elsevier Interactive Patient Education  2017 [employer].

## 2021-01-22 NOTE — Telephone Encounter (Signed)
Pt called and informed that bloodwork is normal

## 2021-01-22 NOTE — Addendum Note (Signed)
Addended by: Jake Seats on: 01/22/2021 10:14 AM   Modules accepted: Orders

## 2021-02-02 ENCOUNTER — Other Ambulatory Visit: Payer: Self-pay

## 2021-02-02 ENCOUNTER — Ambulatory Visit: Payer: BC Managed Care – PPO

## 2021-02-02 ENCOUNTER — Ambulatory Visit (INDEPENDENT_AMBULATORY_CARE_PROVIDER_SITE_OTHER): Payer: BC Managed Care – PPO | Admitting: Counselor

## 2021-02-02 ENCOUNTER — Encounter: Payer: Self-pay | Admitting: Counselor

## 2021-02-02 DIAGNOSIS — R413 Other amnesia: Secondary | ICD-10-CM

## 2021-02-02 DIAGNOSIS — F4321 Adjustment disorder with depressed mood: Secondary | ICD-10-CM | POA: Diagnosis not present

## 2021-02-02 DIAGNOSIS — G3184 Mild cognitive impairment, so stated: Secondary | ICD-10-CM

## 2021-02-02 DIAGNOSIS — F09 Unspecified mental disorder due to known physiological condition: Secondary | ICD-10-CM

## 2021-02-02 DIAGNOSIS — F439 Reaction to severe stress, unspecified: Secondary | ICD-10-CM | POA: Diagnosis not present

## 2021-02-02 NOTE — Progress Notes (Signed)
   Psychometrist Note   Cognitive testing was administered to Holly [name] by Lamar Benes, B.S. (Technician) under the supervision of Alphonzo Severance, Psy.D., ABN. [name] was able to tolerate all test procedures. Dr. Nicole Kindred met with the patient as needed to manage any emotional reactions to the testing procedures. Rest breaks were offered.    The battery of tests administered was selected by Dr. Nicole Kindred with consideration to the patient's current level of functioning, the nature of her symptoms, emotional and behavioral responses during the interview, level of literacy, observed level of motivation/effort, and the nature of the referral question. This battery was communicated to the psychometrist. Communication between Dr. Nicole Kindred and the psychometrist was ongoing throughout the evaluation and Dr. Nicole Kindred was immediately accessible at all times. Dr. Nicole Kindred provided supervision to the technician on the date of this service, to the extent necessary to assure the quality of all services provided.    [name] will return in approximately one week for an interactive feedback session with Dr. Nicole Kindred, at which time test performance, clinical impressions, and treatment recommendations will be reviewed in detail. The patient understands she can contact our office should she require our assistance before this time.   A total of 115 minutes of billable time were spent with Holly [name] by the technician, including test administration and scoring time. Billing for these services is reflected in Dr. Les Pou note.   This note reflects time spent with the psychometrician and does not include test scores, clinical history, or any interpretations made by Dr. Nicole Kindred. The full report will follow in a separate note.

## 2021-02-02 NOTE — Progress Notes (Signed)
[facility]  Patient Name: Holly [name] MRN: [id number] Date of Birth: [birth year] Age: 66 y.o. Education: 12 years  Measurement properties of test scores: IQ, Index, and Standard Scores (SS): Mean = 100; Standard Deviation = 15 Scaled Scores (Ss): Mean = 10; Standard Deviation = 3 Z scores (Z): Mean = 0; Standard Deviation = 1 T scores (T); Mean = 50; Standard Deviation = 10  TEST SCORES:    Note: This summary of test scores accompanies the interpretive report and should not be interpreted by unqualified individuals or in isolation without reference to the report. Test scores are relative to age, gender, and educational history as available and appropriate.   Performance Validity        The Dot Counting Test: Raw Descriptor      E-Score 14 Within Expectation  "A" Random Letter Test Errors 0 Within Expectation      Embedded Measures: Raw Descriptor      RBANS Effort Index: 0 Within Expectation      WAIS-IV Reliable Digit Span 9 Within Expectation      WAIS-IV Reliable Digit Span Revised 13 Within Expectation      Expected Functioning        Wide Range Achievement Test (Word Reading): Standard/Scaled Score Percentile       Word Reading 60 <1      Reynolds Intellectual Screening Test Standard/T-score Percentile      Guess What 29 2      Odd Item Out 56 73  RIST Index 90 25      Cognitive Testing        RBANS, Form : Standard/Scaled Score Percentile  Total Score 76 5  Immediate Memory 65 1      List Learning 4 2      Story Memory 4 2  Visuospatial/Constructional 66 1      Figure Copy   (14) 4 2      Judgment of Line Orientation   (10) --- 3-9  Language 90 25      Picture Naming --- 51-75      Semantic Fluency 6 9  Attention 91 27      Digit Span 10 50      Coding 7 16  Delayed Memory 92 30      List Recall   (2) --- 3-9      List Recognition   (19) --- 26-50      Story Recall   (5) 5 5      Figure Recall   (9) 7 16       Wechsler Adult Intelligence Scale - IV: Standard/Scaled Score Percentile  Working Memory Index 80 9      Digit Span 8 25          Digit Span Forward 11 63          Digit Span Backward 7 16          Digit Span Sequencing 7 16      Arithmetic 5 5  Processing Speed Index 74 4      Symbol Search 4 2      Coding 6 9      Neuropsychological Assessment Battery (Language Module): T-score Percentile      Naming   (25) 29 2      Verbal Fluency: T-score Percentile      Controlled Oral Word Association (F-A-S) 34 5      Semantic Fluency (Animals) 59 82  Trail Making Test: T-Score Percentile      Part A 44 27      Part B 49 46      Modified [place] Card Sorting Test [facility]): Standard/T-Score Percentile      Number of Categories Correct 43 25      Number of Perseverative Errors 66 95      Number of Total Errors 46 34      Percent Perseverative Errors 64 92  Executive Function Composite 107 68      Rating Scales         Raw Score Descriptor  Patient Health Questionnaire - 9 13 Moderate  GAD-7 6 Mild    Virgel Haro V. Nicole Kindred PsyD, [place] Clinical Neuropsychologist

## 2021-02-02 NOTE — Progress Notes (Signed)
[facility]  Patient Name: Holly [name] MRN: [id number] Date of Birth: Sep 07, 1955 Age: 65 y.o. Education: 12 years  Referral Circumstances and Background Information  Holly [name] is a 65 y.o., right-hand dominant, widowed (husband passed 4 years ago) woman with a history of HTN, HLD, depression, vitamin D deficiency, back issues, and memory problems. She was seen recently by Dr. Delice Lesch with our outpatient neurology practice who noted mild memory loss over the past 2 to 3 months. From Dr. Amparo Bristol documentation, the patient's symptoms sound quite minimal and involve misplacing things, forgetting to turn the oven off once, and that is about it. Incidentally, she moved in to help care give for her mother 5 months ago who has "moderate stage dementia," which may be an additional source of stress. She presents for neuropsychological evaluation in the service of diagnostic clarification.   On interview, the patient reported that she has noticed memory and thinking problems over the past year that have worsened over time and have been most noticeable over the past 2 or 3 months. The first changes she noticed were misplacing things and that was the only change she noticed. She was feeling stressed and anxious at that time, she had lost her husband and father in close succession in the preceding years, so she thought that it might be stress-related. At present, she is having the same issues. She a left a piece of toast in the oven and she forgot to pay a bill, although both of these happened only one time. She doesn't notice much else in the way of day-to-day symptoms and denied repeating herself, forgetting things, or the like. Her son has said she repeats herself but not in a way that suggests forgetting (she will say "Yeah ok" repeatedly, which she has always done). 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 She acknowledged having word finding problems but denied any issues with judgment and problem solving or decision making. There are no delusions or hallucinations.   With respect to mood, the patient acknowledged that she has a great deal of stress. She lost her father and husband several years ago and moved in with her mother. She stated that sometimes, she gets "over anxiety" and then she cannot think. For instance tomorrow is her anniversary with her husband and she has had a hard time focusing all week. She stated that if things do not go right, she gets "really upset," although she had a hard time specifying what being upset feels like or looks like. It sounds like it involves irritation a fair amount of the time and not wanting to be around others. She acknowledged feeling sad and lonely, because her whole life has changed since her husband passed. She denied feeling helpless, hopeless, or worthless and said that she does feel good about the future. She stated that "sometimes" she will get tearful, specifically when she is thinking about her husband. She largely had a hard time identifying emotions it seemed and talking about them in detail. She was going to counseling but had to stop when her father got sick and never went back. She stated that she sleeps well, although she only gets 5 hours of sleep a night because she has to get up at 4 during the week. She feels that her energy is good. She stated that her appetite is up, she has gained  15lbs in the past 2 years.   The patient is fully independent. She recently moved in with her mother to take care of her. 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 She organizes and manages her own medications. She makes her own medical appointments and her mother's medical appointments. She  denied that anybody has commented on cognitive problems at work.  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   Posterior vitreous detachment of 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   Caregiver burden 06/12/2020   Stressful life event affecting family 06/12/2020   Left arm pain 12/27/2019   Left cervical radiculopathy 12/27/2019   Paresthesia 12/27/2019   Memory change 12/27/2019   Diverticulosis 10/18/2019   Family history of colon cancer 08/23/2019   S/P hysterectomy 08/21/2018   Chronic left shoulder pain 08/21/2018   Need for shingles vaccine 08/21/2018   Low back pain with sciatica 08/21/2018   Need for influenza vaccination 05/11/2018   Screen for colon cancer 08/20/2017   Polyarthralgia 08/20/2017   Hematuria 08/20/2017   High risk medication use 11/11/2016   Osteoporosis 10/02/2016   Routine general medical examination at a health care facility 08/15/2016   Former smoker 08/15/2016   Estrogen deficiency 08/15/2016   Menopausal state 08/08/2014   Constipation 02/08/2013   Hyperlipidemia 06/23/2012   Vitamin D deficiency 06/23/2012    Review of Neuroimaging and Relevant Medical History: MRI brain scheduled but has not yet been obtained. I do not see any neuroimaging available for review. There is mention of a head injury with LOC sustained as a child although I do not see mention that she required medical attention or had any sequelae. On interview she stated that she was unconscious for "a couple of days" but denied having any intracranial injuries or complications. When I questioned that because it would be highly atypical to be unconscious that long without such injuries, she stated  that she was "in and out" for a few days. She recalls waking up in a "bubble type thing," perhaps an oxygen tent, in the hospital. She thinks she also had chicken pox and a number of other issues and was in the hospital "quite a while." She denied noticing any persisting sequelae although she did have headaches for a while.   Current Outpatient Medications  Medication Sig Dispense Refill   alendronate (FOSAMAX) 70 MG tablet TAKE 1 TABLET BY MOUTH EVERY WEEK WITH A FULL GLASS OF WATER AND ON AN EMPTY STOMACH 12 tablet 4   benzonatate (TESSALON) 100 MG capsule Take 1 capsule (100 mg total) by mouth 3 (three) times daily as needed for cough. 30 capsule 0   calcium carbonate (OS-CAL) 600 MG TABS tablet Take 600 mg by mouth daily with breakfast.      cholecalciferol (VITAMIN D3) 25 MCG (1000 UNIT) tablet Take 2 tablets (2,000 Units total) by mouth daily. 180 tablet 3   FLUoxetine (PROZAC) 10 MG capsule TAKE 1 CAPSULE(10 MG) BY MOUTH DAILY 90 capsule 0   fluticasone (FLONASE) 50 MCG/ACT nasal spray SHAKE LIQUID AND USE 1 SPRAY  IN EACH NOSTRIL TWICE DAILY 48 g 0   Ginkgo-Ginseng-Gotu Kol-Lecith (GINKGO BILOBA PLUS-GINSENG) 50-150-250-50 MG TABS Take 1 tablet by mouth daily. 30 tablet 2   loratadine (CLARITIN) 10 MG tablet Take 10 mg by mouth daily.      olmesartan-hydrochlorothiazide (BENICAR HCT) 40-12.5 MG tablet Take 1 tablet by mouth daily. 90 tablet 3   rosuvastatin (CRESTOR) 5 MG tablet TAKE 1 TABLET BY MOUTH EVERY NIGHT AT BEDTIME 90 tablet 0   No current facility-administered medications for this visit.    Family History  Problem Relation Age of Onset   Hyperlipidemia Mother    Hypertension Mother    Diabetes Mother    Dementia Mother    Hyperlipidemia Father    Hypertension Father    Diabetes Father    Cancer Father        Stomach   Stomach cancer Father    Stroke Brother        during surgery   Dementia Brother    Seizures Brother    Colon cancer Brother 46       Twin   Heart  disease Neg Hx    Esophageal cancer Neg Hx    Rectal cancer Neg Hx    There is a family history of dementia. Her mother has the condition, she is 17 and they noticed it around 67. Her father also developed dementia around the same time but he passed from cancer. The patient denied that any of her 4 siblings have dementia. She has a twin brother who had a stroke and severe head injury in his 66s and lives in a skilled nursing facility as a result. There is no  family history of psychiatric illness.  Psychosocial History  Developmental, Educational and Employment History: The patient was born in [place] but grew up in the [place] area. The patient denied any history of abuse or neglect. She stated that she did "good" in school, but also that she got mostly C's or B's. When it was discussed further, she acknowledged "struggling" in reading and spelling and stated that she did fail a few classes. She never had to repeat a grade, however, "back then they just passed you on." She was never diagnosed with a learning problem. 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   Psychiatric History: The patient was in counseling after losing her husband, she stopped when her father became ill and did not go back. She estimated it was about 3 months that she was there. She has been on Prozac for about 2 years, she started it related to stress after her father passed. She had stopped it but she got back on it recently. She thinks it is helpful.   Substance Use History: The patient drinks in moderation, perhaps 2 drinks a week. She does not use nicotine, she used to be a smoker and quit the year her husband got sick. She does not use illicit substances.   Relationship History and Living Cimcumstances: The patient was married for 34 years to her  husband, he passed 4 years in August. She has two children from that marriage, none of them live with her. She has five grandchildren.   Mental Status and Behavioral Observations  Sensorium/Arousal: The patient's level of arousal was awake and alert. Hearing and vision were adequate for  testing purposes. Orientation: The patient was generally oriented to person, place, time, and situation.  Appearance: Dressed in appropriate, casual clothing with reasonable grooming and hygiene.  Behavior: The patient was pleasant and appropriate. She presented as somewhat anxious and a bit tremulous on certain tets.  Speech/language: Speech was normal in rate, rhythm, volume and prosody.  Gait/Posture: The patients gait was normal, narrow based, with reasonable arm swing Movement: No overt signs/symptoms of movement disorder noted, did appear to have physiologic tremor at times  Social Comportment: Appropriate Mood: Stressed Affect: Mildly dysphoric and anxious Thought process/content: Thought process was logical, linear, and goal-directed. Thought content was appropriate to the topics discussed.  Safety: The patient denied any thoughts of harming themselves or others when asked directly.  Insight: Fair  Test Procedures  [employer] Achievement Test - 4   Word Reading Spelling Wechsler Adult Intelligence Scale - IV  Digit Span  Arithmetic  Symbol Search  Coding Repeatable Battery for the Assessment of Neuropsychological Status (Form A) A Random Letter Test The Dot Counting Test Controlled Oral Word Association (F-A-S) Semantic Fluency (Animals) Trail Making Test A & B Modified [place] Card Sorting Test Patient Health Questionnaire - 9  GAD-7  Plan  Holly [name] was seen for a psychiatric diagnostic evaluation and neuropsychological testing. She is a 65 year old, right-hand dominant, woman with a lot of loss in the past 4 years and changes in memory and thinking over the past 1 year. The  symptoms that she is noticing are quite minimal and she is still working, nobody else has commented on concerning cognitive changes. She scored a 22/30 on the MoCA, which is in fact normal using race and education adjusted cutpoints and she reports having a history of learning problems that may be pulling down her score. Full and complete note with impressions, recommendations, and interpretation of test data to follow.   Holly Simas Nicole Kindred, PsyD, [facility]  Informed Consent  Risks and benefits of the evaluation were discussed with the patient prior to all testing procedures. I conducted a clinical interview and neuropsychological testing (at least two tests) with Holly [name] and Lamar Benes, B.S. (Technician) administered additional test procedures. The patient was able to tolerate the testing procedures and the patient (and/or family if applicable) is likely to benefit from further follow up to receive the diagnosis and treatment recommendations, which will be rendered at the next encounter.

## 2021-02-05 ENCOUNTER — Telehealth: Payer: Self-pay | Admitting: Neurology

## 2021-02-05 ENCOUNTER — Other Ambulatory Visit: Payer: Self-pay

## 2021-02-05 ENCOUNTER — Other Ambulatory Visit: Payer: BC Managed Care – PPO

## 2021-02-05 ENCOUNTER — Ambulatory Visit
Admission: RE | Admit: 2021-02-05 | Discharge: 2021-02-05 | Disposition: A | Discharge status: Home / Self Care | Payer: BC Managed Care – PPO | Source: Ambulatory Visit | Attending: Neurology | Admitting: Neurology

## 2021-02-05 DIAGNOSIS — R413 Other amnesia: Secondary | ICD-10-CM

## 2021-02-05 NOTE — Telephone Encounter (Signed)
Authorization [id number] for MRI scan of brain without contrast  sent to scan

## 2021-02-06 NOTE — Progress Notes (Signed)
[facility]  Patient Name: Holly [name] MRN: [id number] Date of Birth: 1956-05-26 Age: 65 y.o. Education: 12 years  Clinical Impressions  Holly [name] is a 65 y.o., right-hand dominant, widowed (husband passed 4 years ago) woman with a history of HTN, HLD, depression, vitamin D deficiency and memory problems. She has noticed her memory problems over about the past year but they are most appreciable over the past 2 to 3 months. The day-to-day symptoms she reported were quite minimal and sound like normal cognitive errors to me (e.g., forgetting to pay the bills and burning toast on one occasion, misplacing things, minor difficulties remembering names). She denied any changes in her functioning and no one else has commented cognitive problems. She has an MRI pending. She had a normal neurological exam.    Neuropsychological testing shows findings suggestive of a learning disorder with low scores on measures of English-related academic abilities. Word reading fell at a 2.4th grade level and spelling fell at a 3.7th grade level, which tempers findings significantly for her cognitive test performance. She also demonstrated significant discrepancy between the verbal and visual portions of the RIST, a measure that is known to be relatively insensitive to acquired impairment. These findings all substantially limit conclusions about acquired cognitive impairment that can be drawn from low test scores (particularly on verbally mediated measures). On cognitive testing, she demonstrated extremely low performance on measures of verbal learning and scattered low scores with respect to delayed free recall. Nevertheless, her overall delayed recall was average at an index level, and that is viewed as the most reliable indicator. She had low scores on measures of phonemic fluency and select measures of processing speed that may be related to her learning difficulties.  Extremely low performance was also noted on measures of visuospatial/constructional abilities, the significance of which is not entirely clear. She did do adequately on several challenging measures of executive function that are themselves visually mediated. She reported mild levels of anxiety and moderate levels of depression, including little interest or pleasure in doing things nearly every day over the past two weeks.   Holly [name] is thus manifesting test findings that support the impression of a learning disorder and low scores on measures of visuospatial/constructional abilities that are not as easily explained. She has no red flag signs/symptoms such as RBD, visual hallucinations, or the like and her neurological exam did not show any signs of Parkinsonism. MRI of the brain will be helpful, although she has no known history of stroke and visuospatial functioning is largely a "cortical" ability. One possibility is that she has a nonspecific developmental condition affecting more than just spelling and reading. It is possible that she has some acquired impairment although that is not clear at this time. She also likely has a depressive disorder, although we will go with a diagnosis of adjustment disorder at this time because she has had a lot of change as of late and it's not clear she meets criteria for MDD. In terms of her day-to-day complaints, I think that the patient's level of life stress is likely contributing significantly to her memory problems as is her insufficient sleep.   Diagnostic Impressions: Learning Disorder Adjustment disorder, unspecified  Recommendations to be discussed with patient  Your performance and presentation on assessment were consistent with difficulties reading and spelling that corroborate the history you provided, that you had a hard time in school. I think that the magnitude of findings in the context  of your clinical history suggests that you do have a formal learning  disorder affecting reading and spelling. You had low scores in several areas that may be related to your learning difficulties but you also had low scores on measures of visuospatial and constructional function that are not as easily explained. I think it is more likely that these reflect test artifact than anything concerning, but the possibility that they are picking up on some underlying issue cannot be entirely ruled out.   In terms of your day-to-day cognitive problems, I think that your grief, caregiver burnout, stress, and insufficient sleep are all likely playing a role.   You reported a moderate level of depressive symptoms. Depression can affect cognitive functioning in several ways. For one, there are neurobiological changes in depression that can contribute to attention, concentration, and memory problems. These changes are so common they are part of the criteria we use to diagnose depressive disorders. Depression can also negatively impact your own appraisal of your cognitive abilities, leading you to feel like you are performing more poorly than is objectively warranted. The recommended treatments for depression are psychotherapy and medication.   In terms of sleep, most individuals need around 8 hours of sleep per night, and you reported that you are only getting 5. There is little that is as disruptive to brain functioning as insufficient sleep, so I would recommend that you increase the amount of sleep you are getting.   If you are having a hard time sleeping, then consider trying some of the following sleep hygiene recommendations. They may not work at once and may take effort, but the effort you spend is likely to be rewarded with better sleep eventually:  Stick to a sleep schedule of the same bedtime and wake time even on the weekends, which can help to regulate your body's internal clock so that you fall asleep and stay asleep.  Practice a relaxing bedtime ritual (conducted away from  bright lights) which will help separate your sleep from stimulating activities and prepare your body to fall asleep when you go to bed.  Avoid naps, especially in the afternoon.  Evaluate your room and create conditions that will promote sleep such as keeping it cool (between 60 - 67 degrees), quiet, and free from any lights. Consider using blackout curtains, a "white noise" generator, or fan that will help mask any noises that might prevent you from going to sleep or awaken you during the night.  Sleep on a comfortable mattress and pillows.  Avoid bright light in the evening and excessive use of portable electronic devices right before bed that may contain light frequencies that can contribute to sleep problems.  Avoid alcohol, cigarettes, or heavy meals in the evening. If you must eat, consume a light snack 45 minutes before bed.  Use your bed only for sleep to strengthen the association between your bed and sleep.  If you can't go to sleep within 30 minutes, go into another room and do something relaxing until you feel tired. Then, come back and try to go to sleep again for 30 minutes and repeat until sleep is achieved.  Some people find over the counter melatonin to be helpful for sleep, which you could discuss with a pharmacist or prescribing provider.   Caregiver burn out refers to increased stress, mood problems, and other difficulties that are seen frequently in individuals who care for others. Caregiver burnout can lead to depression. It is also self-defeating, because it can interfere with  a caregiver's ability to care for others. There is research that mortality in spouses who provide care to a demented loved one may be higher than for the individual with dementia themselves. For all these reasons, I recommend that you to actively manage your stress level and take as good care of yourself as you do of your loved one. Strategies that may help include:  Splitting caregiving responsibilities  amongst multiple family members. Getting paid supports so that you can take some time to yourself. Aggressively treating depression and anxiety that are understandable reactions to the stress of caregiving. This may include counseling or antidepressant medication depending on your symptoms and treatment of choice.  Even something simple such as scheduling small periods of time every day where you can engage in a desired hobby or pleasurable activity can be extremely helpful.   Test Findings  Test scores are summarized in additional documentation associated with this encounter. Test scores are relative to age, gender, and educational history as available and appropriate. There were no concerns about performance validity as all findings fell within normal expectations.   General Intellectual Functioning/Achievement:  Performance on achievement related indicators of academic skills generated low scores. Her single word reading was extremely low and her spelling was unusually low, equivalent to the 2.4th and 3.7th grade levels, respectively. These measures tend to be resistant to acquired cerebral impairment and as such likely reflect premorbid difficulties. Performance on the RIST index was toward the low end of the average range but there was a significant discrepancy between her verbal and visual subtest performance. Performance on the more verbally mediated measure was extremely low and the visually oriented indicator was toward the uppermost aspect of the average range. These findings suggest the presence of a learning disorder, in the context of her clinical history, and significantly temper expectations for her test performance.   Attention and Processing Efficiency: Indications of attention and working memory fell at a low average level on the Working Memory Index of the WAIS-IV. Performance was average on digit repetition forward whereas digit repetition backward and digit resequencing in ascending  order were low average. Mental solving of arithmetical word problems was unusually low, which may to some extent reflect her learning problems.   With respect to processing speed, findings were variable, with an overall unusually low score on the WAIS-IV Processing Speed Index. Performance on one timed number-symbol coding procedure was low average but she scored in the unusually low range on another similar measure. Performance on a task emphasizing efficient visual matching and efficient visual scanning was extremely low. This could to some extent reflect her history of learning difficulties.   Language: Performance on language measures was notable for low visual object confrontation naming, although my sense is that this reflects limited premorbid knowledge. Many of the items she missed were low frequency words that she did not get even when provided with phonemic cues (e.g., asparagus). She scored in the unusually low range on a measure of associative verbal fluency, which could be related to phonologic knowledge deficits. She scored in the high average range with respect to animal fluency.   Visuospatial Function: The patient's performance on measures of visuospatiual and constructional abilities was extremely low, although there were some test taking behaviors of note. On figure copy, her drawing was sloppy and she seemed to rush despite being instructed to slow down. She made an error, then scribbled it out, then made another error, then went back to her original drawing and all  of this likely adversely affected her score. Her performance was also low on judgment of angular line orientations, with the score falling in the unusually low range.   Learning and Memory: Performance on measures of learning and memory was notable for low levels of initial encoding on verbal measures and weak delayed free recall but she performed at an average level overall on the Delayed Memory Index, which is  incompatible with a storage deficit. The profile is likely significantly impacted by her history of learning difficulties, any issues beyond that are unclear.   In the verbal realm, Ms. Burdine learned 4, 5, 5, and 6 words of a 10-item word list across four learning trials, which is extremely low. She retained 2 words on long delayed recall, which is unusually low although her recognition discriminability for the words was good and she scored in the average range. Memory for a short story was similar with extremely low immediate recall and unusually low delayed free recall.   In the visual realm, delayed recall of a modestly complex geometric figure stimulus was low average. Interestingly, this appeared more organized than her initial copy, although she did leave out some details.   Executive Functions: Performance on executive indicators was reasonably good, although she did have an isolated unusually low score when generating words on the basis of the letters F-A-S, which may be related to phonologic knowledge deficits. Alternating sequencing of numbers and letters of the alphabet was average. She performed toward the upper end of the average range on the Executive Function Composite of the [place] Test. Of note, this is a visually mediated measure. Her categories score was low average but her perseverative errors score was outstanding and in the superior range.   Rating Scale(s): Ms. Bazzano reported moderate levels of depressive symptoms and mild levels of anxiety symptoms. While many of the symptoms she reported were somatic, she did report little interest or pleasure in doing things nearly every day.   Viviano Simas Nicole Kindred, PsyD, ABN Clinical Neuropsychologist  Coding and Compliance  Billing below reflects technician time, my direct face-to-face time with the patient, time spent in test administration, and time spent in professional activities including but not limited to:  neuropsychological test interpretation, integration of neuropsychological test data with clinical history, report preparation, treatment planning, care coordination, and review of diagnostically pertinent medical history or studies.   Services associated with this encounter: Clinical Interview [phone]) plus 155 minutes (96132/96133; Neuropsychological Evaluation by Professional)  25 minutes (96136/96137; Test Administration by Professional) 115 minutes (96138/96139; Neuropsychological Testing by Technician)

## 2021-02-08 ENCOUNTER — Telehealth: Payer: Self-pay | Admitting: Medical

## 2021-02-08 ENCOUNTER — Other Ambulatory Visit: Payer: Self-pay

## 2021-02-08 MED ORDER — VALACYCLOVIR HCL 1 G PO TABS
2000.0000 mg | ORAL_TABLET | Freq: Two times a day (BID) | ORAL | 2 refills | Status: DC
Start: 2021-02-08 — End: 2021-11-16

## 2021-02-08 NOTE — Telephone Encounter (Signed)
Medication has been sent.  

## 2021-02-08 NOTE — Telephone Encounter (Signed)
Is it ok to send the medication for patient?

## 2021-02-08 NOTE — Telephone Encounter (Signed)
Pt called for refills of Valtrex. Please send to [facility] on [address].

## 2021-02-09 ENCOUNTER — Encounter: Payer: Self-pay | Admitting: Counselor

## 2021-02-09 ENCOUNTER — Other Ambulatory Visit: Payer: Self-pay | Admitting: Medical

## 2021-02-09 ENCOUNTER — Other Ambulatory Visit: Payer: Self-pay

## 2021-02-09 ENCOUNTER — Ambulatory Visit (INDEPENDENT_AMBULATORY_CARE_PROVIDER_SITE_OTHER): Payer: BC Managed Care – PPO | Admitting: Counselor

## 2021-02-09 DIAGNOSIS — F4320 Adjustment disorder, unspecified: Secondary | ICD-10-CM

## 2021-02-09 NOTE — Progress Notes (Signed)
NEUROPSYCHOLOGY FEEDBACK NOTE [facility]  Feedback Note: I met with Holly [name] to review the findings resulting from her neuropsychological evaluation. Since the last appointment, she has been about the same. She is "ready to get this over with" and would like to know what is wrong. Time was spent reviewing the impressions and recommendations that are detailed in the evaluation report. We discussed impression of learning disorder, which probably accounts for most of her low test scores. She does have extremely low performance on measures of visuospatial functioning as well, of unclear significance. We discussed sleep, depression/stress and the like as potentially contributing. Other topics of discussion as reflected in the patient instructions. I took time to explain the findings and answer all the patient's questions. I encouraged Holly [name] to contact me should she have any further questions or if further follow up is desired.   Current Medications and Medical History   Current Outpatient Medications  Medication Sig Dispense Refill   alendronate (FOSAMAX) 70 MG tablet TAKE 1 TABLET BY MOUTH EVERY WEEK WITH A FULL GLASS OF WATER AND ON AN EMPTY STOMACH 12 tablet 4   benzonatate (TESSALON) 100 MG capsule Take 1 capsule (100 mg total) by mouth 3 (three) times daily as needed for cough. 30 capsule 0   calcium carbonate (OS-CAL) 600 MG TABS tablet Take 600 mg by mouth daily with breakfast.      cholecalciferol (VITAMIN D3) 25 MCG (1000 UNIT) tablet Take 2 tablets (2,000 Units total) by mouth daily. 180 tablet 3   FLUoxetine (PROZAC) 10 MG capsule TAKE 1 CAPSULE(10 MG) BY MOUTH DAILY 90 capsule 0   fluticasone (FLONASE) 50 MCG/ACT nasal spray SHAKE LIQUID AND USE 1 SPRAY IN EACH NOSTRIL TWICE DAILY 48 g 0   Ginkgo-Ginseng-Gotu Kol-Lecith (GINKGO BILOBA PLUS-GINSENG) 50-150-250-50 MG TABS Take 1 tablet by mouth daily. 30 tablet 2   loratadine (CLARITIN) 10 MG tablet Take 10 mg by mouth  daily.      olmesartan-hydrochlorothiazide (BENICAR HCT) 40-12.5 MG tablet Take 1 tablet by mouth daily. 90 tablet 3   rosuvastatin (CRESTOR) 5 MG tablet TAKE 1 TABLET BY MOUTH EVERY NIGHT AT BEDTIME 90 tablet 0   valACYclovir (VALTREX) 1000 MG tablet Take 2 tablets (2,000 mg total) by mouth 2 (two) times daily. 30 tablet 2   No current facility-administered medications for this visit.    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   Posterior vitreous detachment of 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   Caregiver burden 06/12/2020   Stressful life event affecting family 06/12/2020   Left arm pain 12/27/2019   Left cervical radiculopathy 12/27/2019   Paresthesia 12/27/2019   Memory change 12/27/2019   Diverticulosis 10/18/2019   Family history of colon cancer 08/23/2019   S/P hysterectomy 08/21/2018   Chronic left shoulder pain 08/21/2018   Need for shingles vaccine 08/21/2018   Low back pain with sciatica 08/21/2018   Need for influenza vaccination 05/11/2018   Screen for colon cancer 08/20/2017   Polyarthralgia 08/20/2017   Hematuria 08/20/2017   High risk medication use 11/11/2016   Osteoporosis 10/02/2016   Routine general medical examination at a health care facility 08/15/2016   Former smoker 08/15/2016   Estrogen deficiency 08/15/2016   Menopausal  state 08/08/2014   Constipation 02/08/2013   Hyperlipidemia 06/23/2012   Vitamin D deficiency 06/23/2012    Mental Status and Behavioral Observations  Holly [name] presented on time to the present encounter and was alert and generally oriented. Speech was normal in rate, rhythm, volume, and prosody. Self-reported mood was "Fine, ready to get this over with"  and affect was neutral. Thought process was logical and goal oriented and thought content was appropriate to the topics discussed. There were no safety concerns identified at today's encounter, such as thoughts of harming self or others.   Plan  Feedback provided regarding the patient's neuropsychological evaluation. She presented as relieved and said that she felt better after the appointment. I made a referral to [facility] for psychotherapy, which she is interested in. Also suggested she discuss medication titration with Mr. Tysinger, she is on a very low dose. She will also look into getting more help with her mother for caregiver burnout.  Holly [name] was encouraged to contact me if any questions arise or if further follow up is desired.   Viviano Simas Nicole Kindred, PsyD, ABN Clinical Neuropsychologist  Service(s) Provided at This Encounter: 30 minutes [phone]; Psychotherapy with patient/family)

## 2021-02-09 NOTE — Patient Instructions (Addendum)
Your performance and presentation on assessment were consistent with difficulties reading and spelling that corroborate the history you provided, that you had a hard time in school. I think that the magnitude of findings in the context of your clinical history suggests that you do have a formal learning disorder affecting reading and spelling. You had low scores in several areas that may be related to your learning difficulties but you also had low scores on measures of visuospatial and constructional function that are not as easily explained. I think it is more likely that these reflect test artifact than anything concerning, but the possibility that they are picking up on some underlying issue cannot be entirely ruled out.   In terms of your day-to-day cognitive problems, I think that your grief, caregiver burnout, stress, and insufficient sleep are all likely playing a role.   You reported a moderate level of depressive symptoms. Depression can affect cognitive functioning in several ways. For one, there are neurobiological changes in depression that can contribute to attention, concentration, and memory problems. These changes are so common they are part of the criteria we use to diagnose depressive disorders. Depression can also negatively impact your own appraisal of your cognitive abilities, leading you to feel like you are performing more poorly than is objectively warranted. The recommended treatments for depression are psychotherapy and medication.  I made a referral for [facility] for psychotherapy for you. They will be in touch.   In terms of sleep, most individuals need around 8 hours of sleep per night, and you reported that you are only getting 5. There is little that is as disruptive to brain functioning as insufficient sleep, so I would recommend that you increase the amount of sleep you are getting.   If you are having a hard time sleeping, then consider trying some of  the following sleep hygiene recommendations. They may not work at once and may take effort, but the effort you spend is likely to be rewarded with better sleep eventually:   Stick to a sleep schedule of the same bedtime and wake time even on the weekends, which can help to regulate your body's internal clock so that you fall asleep and stay asleep. Practice a relaxing bedtime ritual (conducted away from bright lights) which will help separate your sleep from stimulating activities and prepare your body to fall asleep when you go to bed. Avoid naps, especially in the afternoon. Evaluate your room and create conditions that will promote sleep such as keeping it cool (between 60 - 67 degrees), quiet, and free from any lights. Consider using blackout curtains, a "white noise" generator, or fan that will help mask any noises that might prevent you from going to sleep or awaken you during the night. Sleep on a comfortable mattress and pillows. Avoid bright light in the evening and excessive use of portable electronic devices right before bed that may contain light frequencies that can contribute to sleep problems. Avoid alcohol, cigarettes, or heavy meals in the evening. If you must eat, consume a light snack 45 minutes before bed. Use your bed only for sleep to strengthen the association between your bed and sleep. If you can't go to sleep within 30 minutes, go into another room and do something relaxing until you feel tired. Then, come back and try to go to sleep again for 30 minutes and repeat until sleep is achieved. Some people find over the counter melatonin to be helpful for sleep, which you could  discuss with a pharmacist or prescribing provider.   Caregiver burn out refers to increased stress, mood problems, and other difficulties that are seen frequently in individuals who care for others. Caregiver burnout can lead to depression. It is also self-defeating, because it can interfere with a  caregiver's ability to care for others. There is research that mortality in spouses who provide care to a demented loved one may be higher than for the individual with dementia themselves. For all these reasons, I recommend that you to actively manage your stress level and take as good care of yourself as you do of your loved one. Strategies that may help include:   Splitting caregiving responsibilities amongst multiple family members. Getting paid supports so that you can take some time to yourself. Aggressively treating depression and anxiety that are understandable reactions to the stress of caregiving. This may include counseling or antidepressant medication depending on your symptoms and treatment of choice. Even something simple such as scheduling small periods of time every day where you can engage in a desired hobby or pleasurable activity can be extremely helpful.

## 2021-02-16 ENCOUNTER — Encounter: Payer: Self-pay | Admitting: Physician Assistant

## 2021-02-16 ENCOUNTER — Ambulatory Visit: Payer: BC Managed Care – PPO | Admitting: Physician Assistant

## 2021-02-16 ENCOUNTER — Other Ambulatory Visit: Payer: Self-pay

## 2021-02-16 VITALS — BP 122/75 | HR 78 | Ht 64.0 in | Wt 160.0 lb

## 2021-02-16 DIAGNOSIS — F4320 Adjustment disorder, unspecified: Secondary | ICD-10-CM | POA: Diagnosis not present

## 2021-02-16 DIAGNOSIS — R413 Other amnesia: Secondary | ICD-10-CM | POA: Diagnosis not present

## 2021-02-16 NOTE — Patient Instructions (Addendum)
It was a pleasure to see you today at our office.   Recommendations:   Importance of regular daily schedule with inclusion of crossword puzzles to maintain brain function.  Continue to monitor mood with Behavioral Medicine Say active exercising  at least 30 minutes at least 3 times a week.  Naps should be scheduled and should be no longer than 60 minutes and should not occur after 2 PM.  Mediterranean diet is recommended   RECOMMENDATIONS FOR ALL PATIENTS WITH MEMORY PROBLEMS: 1. Continue to exercise (Recommend 30 minutes of walking everyday, or 3 hours every week) 2. Increase social interactions - continue going to [place] and enjoy social gatherings with friends and family 3. Eat healthy, avoid fried foods and eat more fruits and vegetables 4. Maintain adequate blood pressure, blood sugar, and blood cholesterol level. Reducing the risk of stroke and cardiovascular disease also helps promoting better memory. 5. Avoid stressful situations. Live a simple life and avoid aggravations. Organize your time and prepare for the next day in anticipation. 6. Sleep well, avoid any interruptions of sleep and avoid any distractions in the bedroom that may interfere with adequate sleep quality 7. Avoid sugar, avoid sweets as there is a strong link between excessive sugar intake, diabetes, and cognitive impairment We discussed the Mediterranean diet, which has been shown to help patients reduce the risk of progressive memory disorders and reduces cardiovascular risk. This includes eating fish, eat fruits and green leafy vegetables, nuts like almonds and hazelnuts, walnuts, and also use olive oil. Avoid fast foods and fried foods as much as possible. Avoid sweets and sugar as sugar use has been linked to worsening of memory function.  There is always a concern of gradual progression of memory problems. If this is the case, then we may need to adjust level of care according to patient needs. Support, both to the  patient and caregiver, should then be put into place.       Mediterranean Diet A Mediterranean diet refers to food and lifestyle choices that are based on the traditions of countries located on the [employer]. This way of eating has been shown to help prevent certain conditions and improve outcomes for people who have chronic diseases, like kidney disease and heart disease. What are tips for following this plan? Lifestyle  Cook and eat meals together with your family, when possible. Drink enough fluid to keep your urine clear or pale yellow. Be physically active every day. This includes: Aerobic exercise like running or swimming. Leisure activities like gardening, walking, or housework. Get 7-8 hours of sleep each night. If recommended by your health care provider, drink red wine in moderation. This means 1 glass a day for nonpregnant women and 2 glasses a day for men. A glass of wine equals 5 oz (150 mL). Reading food labels  Check the serving size of packaged foods. For foods such as rice and pasta, the serving size refers to the amount of cooked product, not dry. Check the total fat in packaged foods. Avoid foods that have saturated fat or trans fats. Check the ingredients list for added sugars, such as corn syrup. Shopping  At the grocery store, buy most of your food from the areas near the walls of the store. This includes: Fresh fruits and vegetables (produce). Grains, beans, nuts, and seeds. Some of these may be available in unpackaged forms or large amounts (in bulk). Fresh seafood. Poultry and eggs. Low-fat dairy products. Buy whole ingredients instead of prepackaged foods. Buy  fresh fruits and vegetables in-season from local farmers markets. Buy frozen fruits and vegetables in resealable bags. If you do not have access to quality fresh seafood, buy precooked frozen shrimp or canned fish, such as tuna, salmon, or sardines. Buy small amounts of raw or cooked vegetables,  salads, or olives from the deli or salad bar at your store. Stock your pantry so you always have certain foods on hand, such as olive oil, canned tuna, canned tomatoes, rice, pasta, and beans. Cooking  Cook foods with extra-virgin olive oil instead of using butter or other vegetable oils. Have meat as a side dish, and have vegetables or grains as your main dish. This means having meat in small portions or adding small amounts of meat to foods like pasta or stew. Use beans or vegetables instead of meat in common dishes like chili or lasagna. Experiment with different cooking methods. Try roasting or broiling vegetables instead of steaming or sauteing them. Add frozen vegetables to soups, stews, pasta, or rice. Add nuts or seeds for added healthy fat at each meal. You can add these to yogurt, salads, or vegetable dishes. Marinate fish or vegetables using olive oil, lemon juice, garlic, and fresh herbs. Meal planning  Plan to eat 1 vegetarian meal one day each week. Try to work up to 2 vegetarian meals, if possible. Eat seafood 2 or more times a week. Have healthy snacks readily available, such as: Vegetable sticks with hummus. Greek yogurt. Fruit and nut trail mix. Eat balanced meals throughout the week. This includes: Fruit: 2-3 servings a day Vegetables: 4-5 servings a day Low-fat dairy: 2 servings a day Fish, poultry, or lean meat: 1 serving a day Beans and legumes: 2 or more servings a week Nuts and seeds: 1-2 servings a day Whole grains: 6-8 servings a day Extra-virgin olive oil: 3-4 servings a day Limit red meat and sweets to only a few servings a month What are my food choices? Mediterranean diet Recommended Grains: Whole-grain pasta. Brown rice. Bulgar wheat. Polenta. Couscous. Whole-wheat bread. [place]. Vegetables: Artichokes. Beets. Broccoli. Cabbage. Carrots. Eggplant. Green beans. Chard. Kale. Spinach. Onions. Leeks. Peas. Squash. Tomatoes. Peppers.  Radishes. Fruits: Apples. Apricots. Avocado. Berries. Bananas. Cherries. Dates. Figs. Grapes. Lemons. Melon. Oranges. Peaches. Plums. Pomegranate. Meats and other protein foods: Beans. Almonds. Sunflower seeds. Pine nuts. Peanuts. [place]. Salmon. Scallops. Shrimp. [place]. Tilapia. Clams. Oysters. Eggs. Dairy: Low-fat milk. Cheese. Greek yogurt. Beverages: Water. Red wine. Herbal tea. Fats and oils: Extra virgin olive oil. Avocado oil. Grape seed oil. Sweets and desserts: [place] yogurt with honey. Baked apples. Poached pears. Trail mix. Seasoning and other foods: Basil. Cilantro. Coriander. Cumin. Mint. Parsley. Sage. Rosemary. Tarragon. Garlic. Oregano. Thyme. Pepper. Balsalmic vinegar. Tahini. Hummus. Tomato sauce. Olives. Mushrooms. Limit these Grains: Prepackaged pasta or rice dishes. Prepackaged cereal with added sugar. Vegetables: Deep fried potatoes (french fries). Fruits: Fruit canned in syrup. Meats and other protein foods: Beef. Pork. Lamb. Poultry with skin. Hot dogs. [place] Salines. Dairy: Ice cream. Sour cream. Whole milk. Beverages: Juice. Sugar-sweetened soft drinks. Beer. Liquor and spirits. Fats and oils: Butter. Canola oil. Vegetable oil. Beef fat (tallow). Lard. Sweets and desserts: Cookies. Cakes. Pies. Candy. Seasoning and other foods: Mayonnaise. Premade sauces and marinades. The items listed may not be a complete list. Talk with your dietitian about what dietary choices are right for you. Summary The Mediterranean diet includes both food and lifestyle choices. Eat a variety of fresh fruits and vegetables, beans, nuts, seeds, and whole grains. Limit the amount  of red meat and sweets that you eat. Talk with your health care provider about whether it is safe for you to drink red wine in moderation. This means 1 glass a day for nonpregnant women and 2 glasses a day for men. A glass of wine equals 5 oz (150 mL). This information is not intended to replace advice given to you by your health  care provider. Make sure you discuss any questions you have with your health care provider. Document Released: 03/28/2016 Document Revised: 04/30/2016 Document Reviewed: 03/28/2016 Elsevier Interactive Patient Education  2017 [employer].
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
## 2021-02-20 ENCOUNTER — Encounter (INDEPENDENT_AMBULATORY_CARE_PROVIDER_SITE_OTHER): Payer: BC Managed Care – PPO | Admitting: Ophthalmology

## 2021-02-21 ENCOUNTER — Other Ambulatory Visit: Payer: Self-pay | Admitting: Medical

## 2021-03-14 ENCOUNTER — Other Ambulatory Visit: Payer: Self-pay | Admitting: Medical

## 2021-03-16 LAB — HM MAMMOGRAPHY

## 2021-03-22 ENCOUNTER — Telehealth: Payer: Self-pay | Admitting: Medical

## 2021-03-22 ENCOUNTER — Encounter (INDEPENDENT_AMBULATORY_CARE_PROVIDER_SITE_OTHER): Payer: BC Managed Care – PPO | Admitting: Ophthalmology

## 2021-03-22 NOTE — Telephone Encounter (Signed)
I am happy to report that her mammogram was normal, no worrisome findings.

## 2021-03-23 ENCOUNTER — Encounter: Payer: Self-pay | Admitting: Internal Medicine

## 2021-03-23 NOTE — Telephone Encounter (Signed)
Left detailed message on pt's vm

## 2021-04-04 ENCOUNTER — Other Ambulatory Visit: Payer: Self-pay | Admitting: Medical

## 2021-05-26 ENCOUNTER — Other Ambulatory Visit: Payer: Self-pay | Admitting: Medical

## 2021-05-28 ENCOUNTER — Other Ambulatory Visit: Payer: Self-pay | Admitting: Medical

## 2021-06-05 ENCOUNTER — Ambulatory Visit: Payer: BC Managed Care – PPO | Admitting: Medical

## 2021-06-05 ENCOUNTER — Other Ambulatory Visit: Payer: Self-pay

## 2021-06-05 VITALS — BP 120/80 | HR 79 | Wt 165.0 lb

## 2021-06-05 DIAGNOSIS — L608 Other nail disorders: Secondary | ICD-10-CM | POA: Insufficient documentation

## 2021-06-05 DIAGNOSIS — Z23 Encounter for immunization: Secondary | ICD-10-CM | POA: Diagnosis not present

## 2021-06-05 DIAGNOSIS — L600 Ingrowing nail: Secondary | ICD-10-CM | POA: Insufficient documentation

## 2021-06-05 NOTE — Progress Notes (Signed)
Subjective:  Holly [name] is a 65 y.o. female who presents for Chief Complaint  Patient presents with   ingrown toe nail    Both big toes, x 6 months      If for ingrowing toenail.  She notes that when she goes to the pedicure place they comment on her toenails possibly ingrowing and feel like she should see a foot doctor.  She has a little tenderness of her left great toe but no redness no swelling no pus drainage.  No fever.  She has never had a procedure for ingrown toenail.  No other complaint. No other aggravating or relieving factors.    No other c/o.  The following portions of the patient's history were reviewed and updated as appropriate: allergies, current medications, past family history, past medical history, past social history, past surgical history and problem list.  ROS Otherwise as in subjective above  Objective: BP 120/80   Pulse 79   Wt 165 lb (74.8 kg)   BMI 28.32 kg/m   General appearance: alert, no distress, well developed, well nourished Left great toenail with slight ingrowing of the medial nail bed, but no specific toenail ingrowing to the point of causing swelling redness pus or infection today.  She does have several toenails that are quite significantly curved make it difficult for trimming Feet with normal sensation and strength.  Left foot somewhat cool to touch but 1+ pulses of both feet pedal pulses     Assessment: Encounter Diagnoses  Name Primary?   Ingrown toenail of left foot Yes   Toenail deformity    Needs flu shot      Plan: The only nail that is slightly ingrowing is the left great toenail medial surface.  She does have several toenails that are quite rounded with her curvature that could present a problem with trimming.  We discussed that given her anatomy I will refer to podiatry for further evaluation and discussion of treatment options.  Counseled on the influenza virus vaccine.  Vaccine information sheet given.  Influenza  vaccine given after consent obtained.   Rayshawn was seen today for ingrown toe nail.  Diagnoses and all orders for this visit:  Ingrown toenail of left foot -     Ambulatory referral to Podiatry  Toenail deformity -     Ambulatory referral to Podiatry  Needs flu shot -     Flu Vaccine QUAD 89mo+IM (Fluarix, Fluzone & Alfiuria Quad PF)   Follow up: pending referral

## 2021-06-05 NOTE — Addendum Note (Signed)
Addended by: Carlena Hurl on: 06/05/2021 12:34 PM   Modules accepted: Level of Service

## 2021-06-15 ENCOUNTER — Ambulatory Visit: Payer: BC Managed Care – PPO | Admitting: Podiatry

## 2021-06-18 ENCOUNTER — Other Ambulatory Visit: Payer: Self-pay | Admitting: Medical

## 2021-06-29 ENCOUNTER — Encounter: Payer: Self-pay | Admitting: Podiatry

## 2021-06-29 ENCOUNTER — Ambulatory Visit: Payer: BC Managed Care – PPO | Admitting: Podiatry

## 2021-06-29 ENCOUNTER — Other Ambulatory Visit: Payer: Self-pay

## 2021-06-29 DIAGNOSIS — L600 Ingrowing nail: Secondary | ICD-10-CM | POA: Diagnosis not present

## 2021-06-29 NOTE — Progress Notes (Signed)
Subjective:  Patient ID: Holly [name], female    DOB: 1956-05-22,  MRN: [id number]  Chief Complaint  Patient presents with   Nail Problem    Possible ingrown in both hallux nails     65 y.o. female presents with the above complaint.  Patient presents with bilateral medial border ingrown.  Patient states it painful to touch.  Painful when ambulating.  Pain scale is 5 out of 10.  She states that she has not had a removed in the past she has not seen anyone else prior to seeing me.  She denies any other acute treatment options.  She went to the nail salon the told her they might be digging into causing her pain.   Review of Systems: Negative except as noted in the HPI. Denies N/V/F/Ch.  Past Medical History:  Diagnosis Date   Chronic constipation    enrolled in a constipation study with [facility] 01/2013, on Plecanatide 6mg  daily   Diverticulosis 01/2013   on colonoscopy   Former smoker 2018   H/O echocardiogram 03/2014   normal, Dr. 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 gets once to twice yearly   Hyperlipidemia    Hypertension    controlled off medication 2019   Menopausal disorder    Microscopic hematuria 04/2013   urology eval, no worrisome causes.  recheck only with frank hematuria.   Normal cardiac stress test 03/23/14   Dr. Tollie Eth   S/P hysterectomy    due to fibroids   Sleep difficulties    due to menopause   SOB (shortness of breath)    cardiac and PFT eval 03/2014   Vitamin D deficiency    Wears glasses     Current Outpatient Medications:    alendronate (FOSAMAX) 70 MG tablet, TAKE 1 TABLET BY MOUTH EVERY WEEK WITH A FULL GLASS OF WATER AND ON AN EMPTY STOMACH, Disp: 12 tablet, Rfl: 4   calcium carbonate (OS-CAL) 600 MG TABS tablet, Take 600 mg by mouth daily with breakfast. , Disp: , Rfl:    cholecalciferol (VITAMIN D3) 25 MCG (1000 UNIT) tablet, Take 2 tablets (2,000 Units total) by mouth daily., Disp: 180 tablet,  Rfl: 3   FLUoxetine (PROZAC) 10 MG capsule, TAKE 1 CAPSULE(10 MG) BY MOUTH DAILY, Disp: 90 capsule, Rfl: 0   fluticasone (FLONASE) 50 MCG/ACT nasal spray, SHAKE LIQUID AND USE 1 SPRAY IN EACH NOSTRIL TWICE DAILY, Disp: 48 g, Rfl: 0   Ginkgo-Ginseng-Gotu Kol-Lecith (GINKGO BILOBA PLUS-GINSENG) 50-150-250-50 MG TABS, Take 1 tablet by mouth daily., Disp: 30 tablet, Rfl: 2   loratadine (CLARITIN) 10 MG tablet, Take 10 mg by mouth daily. , Disp: , Rfl:    olmesartan-hydrochlorothiazide (BENICAR HCT) 40-12.5 MG tablet, Take 1 tablet by mouth daily., Disp: 90 tablet, Rfl: 3   rosuvastatin (CRESTOR) 5 MG tablet, TAKE 1 TABLET BY MOUTH EVERY NIGHT AT BEDTIME, Disp: 90 tablet, Rfl: 0   valACYclovir (VALTREX) 1000 MG tablet, Take 2 tablets (2,000 mg total) by mouth 2 (two) times daily., Disp: 30 tablet, Rfl: 2  Social History   Tobacco Use  Smoking Status Former   Packs/day: 0.30   Years: 17.00   Pack years: 5.10   Types: Cigarettes   Quit date: 07/16/2016   Years since quitting: 4.9  Smokeless Tobacco Never    Allergies  Allergen Reactions   Aspirin     Upsets her stomach taking daily   Pravachol [Pravastatin Sodium]  Leg aches   Penicillins Rash   Objective:  There were no vitals filed for this visit. There is no height or weight on file to calculate BMI. Constitutional Well developed. Well nourished.  Vascular Dorsalis pedis pulses palpable bilaterally. Posterior tibial pulses palpable bilaterally. Capillary refill normal to all digits.  No cyanosis or clubbing noted. Pedal hair growth normal.  Neurologic Normal speech. Oriented to person, place, and time. Epicritic sensation to light touch grossly present bilaterally.  Dermatologic Painful ingrowing nail at medial nail borders of the hallux nail bilaterally. No other open wounds. No skin lesions.  Orthopedic: Normal joint ROM without pain or crepitus bilaterally. No visible deformities. No bony tenderness.   Radiographs:  None Assessment:   1. Ingrown toenail of right foot   2. Ingrown left big toenail    Plan:  Patient was evaluated and treated and all questions answered.  Ingrown Nail, bilaterally -Patient elects to proceed with minor surgery to remove ingrown toenail removal today. Consent reviewed and signed by patient. -Ingrown nail excised. See procedure note. -Educated on post-procedure care including soaking. Written instructions provided and reviewed. -Patient to follow up in 2 weeks for nail check.  Procedure: Excision of Ingrown Toenail Location: Bilateral 1st toe medial nail borders. Anesthesia: Lidocaine 1% plain; 1.5 mL and Marcaine 0.5% plain; 1.5 mL, digital block. Skin Prep: Betadine. Dressing: Silvadene; telfa; dry, sterile, compression dressing. Technique: Following skin prep, the toe was exsanguinated and a tourniquet was secured at the base of the toe. The affected nail border was freed, split with a nail splitter, and excised. Chemical matrixectomy was then performed with phenol and irrigated out with alcohol. The tourniquet was then removed and sterile dressing applied. Disposition: Patient tolerated procedure well. Patient to return in 2 weeks for follow-up.   No follow-ups on file.

## 2021-08-05 ENCOUNTER — Telehealth (HOSPITAL_COMMUNITY): Payer: Self-pay | Admitting: Emergency Medicine

## 2021-08-05 ENCOUNTER — Other Ambulatory Visit: Payer: Self-pay

## 2021-08-05 ENCOUNTER — Encounter (HOSPITAL_COMMUNITY): Payer: Self-pay

## 2021-08-05 ENCOUNTER — Ambulatory Visit (HOSPITAL_COMMUNITY)
Admission: EM | Admit: 2021-08-05 | Discharge: 2021-08-05 | Disposition: A | Discharge status: Home / Self Care | Payer: BC Managed Care – PPO | Attending: Family Medicine | Admitting: Family Medicine

## 2021-08-05 DIAGNOSIS — J3089 Other allergic rhinitis: Secondary | ICD-10-CM

## 2021-08-05 MED ORDER — TRIAMCINOLONE ACETONIDE 40 MG/ML IJ SUSP
40.0000 mg | Freq: Once | INTRAMUSCULAR | Status: AC
  Administered 2021-08-05: 11:00:00 40 mg via INTRAMUSCULAR

## 2021-08-05 MED ORDER — BENZONATATE 100 MG PO CAPS: 100.0000 mg | ORAL_CAPSULE | Freq: Three times a day (TID) | ORAL | 0 refills | Status: DC

## 2021-08-05 MED ORDER — TRIAMCINOLONE ACETONIDE 40 MG/ML IJ SUSP
INTRAMUSCULAR | Status: AC
  Filled 2021-08-05: qty 1

## 2021-08-05 NOTE — Discharge Instructions (Addendum)
Continue your flonase and allergy pill. Take benzonatate 100 mg 1 every 8 hours as needed for cough.

## 2021-08-05 NOTE — ED Provider Notes (Addendum)
[facility]    CSN: [id number] Arrival date & time: 08/05/21  1003      History   Chief Complaint Chief Complaint  Patient presents with   Headache   Cough    HPI Holly [name] is a 65 y.o. female.    Headache Associated symptoms: cough   Cough Associated symptoms: headaches   Here with a h/o sneezing and increased congestion since 12/12, began when someone was near her with some perfume one (pt drives a schoolbus). Then over the next few days she has had her nasal mucus become a little thicker and more pronounced. Is coughing some, and would like a new rx of the tessalon perles. No f/c. No v/d. No malaise. Has had some h/a in her sinus area this AM.  Does have a h/o allergies, and takes an allergy pill and flonase daily. To me states she has sufficient flonase.  Home covid test was negative  PMH: htn, bp today is good. No h/o DM  Past Medical History:  Diagnosis Date   Chronic constipation    enrolled in a constipation study with [facility] 01/2013, on Plecanatide 6mg  daily   Diverticulosis 01/2013   on colonoscopy   Former smoker 2018   H/O echocardiogram 03/2014   normal, Dr. 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 gets once to twice yearly   Hyperlipidemia    Hypertension    controlled off medication 2019   Menopausal disorder    Microscopic hematuria 04/2013   urology eval, no worrisome causes.  recheck only with frank hematuria.   Normal cardiac stress test 03/23/14   Dr. 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   Lattice degeneration, 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   Posterior vitreous detachment of 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   Caregiver burden 06/12/2020   Stressful life event affecting family 06/12/2020   Left arm pain 12/27/2019   Left cervical radiculopathy 12/27/2019   Paresthesia 12/27/2019   Memory change 12/27/2019   Diverticulosis 10/18/2019   Family history of colon cancer 08/23/2019   S/P hysterectomy 08/21/2018   Chronic left shoulder pain 08/21/2018   Need for shingles vaccine 08/21/2018   Low back pain with sciatica 08/21/2018   Need for influenza vaccination 05/11/2018   Screen for colon cancer 08/20/2017   Polyarthralgia 08/20/2017   Hematuria 08/20/2017   High risk medication use 11/11/2016   Osteoporosis 10/02/2016   Routine general medical examination at a health care facility 08/15/2016   Former smoker 08/15/2016   Estrogen deficiency 08/15/2016   Menopausal state 08/08/2014   Constipation 02/08/2013   Hyperlipidemia 06/23/2012   Vitamin D deficiency 06/23/2012    Past Surgical History:  Procedure Laterality Date   COLONOSCOPY  01/2013   external hemorrohids, severe melanosis, diverticulosis; Dr. Deatra Ina.  prior colonoscopy Dr. Collene Mares.   ECTOPIC  [facility]   still has 1 ovary; hx/o uterine fibroids    OB History   No obstetric history on file.      Home Medications    Prior to Admission medications   Medication Sig Start Date End Date Taking? Authorizing Provider  benzonatate (TESSALON) 100 MG capsule Take 1 capsule (100 mg total) by mouth every 8 (eight) hours. 08/05/21  Yes Barrett Henle, MD  alendronate (FOSAMAX) 70 MG tablet TAKE 1 TABLET BY MOUTH EVERY WEEK WITH A FULL GLASS OF WATER AND ON AN EMPTY STOMACH 02/12/21   Tysinger, Camelia Eng, PA-C  calcium carbonate (OS-CAL) 600  MG TABS tablet Take 600 mg by mouth daily with breakfast.     [provider]  cholecalciferol (VITAMIN D3) 25 MCG (1000 UNIT) tablet Take 2 tablets (2,000 Units total) by mouth daily. 10/12/20   Tysinger, Camelia Eng, PA-C  FLUoxetine (PROZAC) 10 MG capsule TAKE 1 CAPSULE(10 MG) BY MOUTH DAILY 04/05/21   Tysinger, Camelia Eng, PA-C  fluticasone (FLONASE) 50 MCG/ACT nasal spray SHAKE LIQUID AND USE 1 SPRAY IN EACH NOSTRIL TWICE DAILY 06/18/21   Tysinger, Camelia Eng, PA-C  Ginkgo-Ginseng-Gotu Kol-Lecith (GINKGO BILOBA PLUS-GINSENG) 50-150-250-50 MG TABS Take 1 tablet by mouth daily. 06/12/20   Tysinger, Camelia Eng, PA-C  loratadine (CLARITIN) 10 MG tablet Take 10 mg by mouth daily.     [provider]  olmesartan-hydrochlorothiazide (BENICAR HCT) 40-12.5 MG tablet Take 1 tablet by mouth daily. 10/12/20   Tysinger, Camelia Eng, PA-C  rosuvastatin (CRESTOR) 5 MG tablet TAKE 1 TABLET BY MOUTH EVERY NIGHT AT BEDTIME 05/28/21   Tysinger, Camelia Eng, PA-C  valACYclovir (VALTREX) 1000 MG tablet Take 2 tablets (2,000 mg total) by mouth 2 (two) times daily. 02/08/21   Tysinger, Camelia Eng, PA-C    Family History Family History  Problem Relation Age of Onset   Hyperlipidemia Mother    Hypertension Mother    Diabetes Mother    Dementia Mother    Hyperlipidemia Father    Hypertension Father    Diabetes Father    Cancer Father        Stomach   Stomach cancer Father    Stroke Brother        during surgery   Dementia Brother    Seizures Brother    Colon cancer Brother 12       Twin   Heart disease Neg Hx    Esophageal cancer Neg Hx    Rectal cancer Neg Hx     Social History Social History   Tobacco Use   Smoking status: Former    Packs/day: 0.30    Years: 17.00    Pack years: 5.10    Types: Cigarettes    Quit date: 07/16/2016    Years since quitting: 5.0   Smokeless tobacco: Never  Vaping Use   Vaping Use: Never used  Substance Use Topics   Alcohol use: Yes    Comment: Social   Drug use: No      Allergies   Aspirin, Pravachol [pravastatin sodium], and Penicillins   Review of Systems Review of Systems  Respiratory:  Positive for cough.   Neurological:  Positive for headaches.    Physical Exam Triage Vital Signs ED Triage Vitals  Enc Vitals Group     BP 08/05/21 1022 135/82     Pulse Rate 08/05/21 1022 77     Resp 08/05/21 1022 18     Temp  08/05/21 1022 98.5 F (36.9 C)     Temp Source 08/05/21 1022 Oral     SpO2 08/05/21 1022 97 %     Weight --      Height --      Head Circumference --      Peak Flow --      Pain Score 08/05/21 1020 4     Pain Loc --      Pain Edu? --      Excl. in [place]? --    No data found.  Updated Vital Signs BP 135/82 (BP Location: Left Arm)    Pulse 77    Temp 98.5 F (36.9 C) (Oral)    Resp 18    SpO2 97%   Visual Acuity Right Eye Distance:   Left Eye Distance:   Bilateral Distance:    Right Eye Near:   Left Eye Near:    Bilateral Near:     Physical Exam Vitals reviewed.  Constitutional:      General: She is not in acute distress.    Appearance: She is not toxic-appearing.  HENT:     Head: Atraumatic.     Right Ear: Tympanic membrane and ear canal normal.     Left Ear: Tympanic membrane and ear canal normal.     Nose: Congestion present.     Mouth/Throat:     Mouth: Mucous membranes are moist.     Pharynx: Oropharyngeal exudate (clear mucus draining) present. No posterior oropharyngeal erythema.  Eyes:     Extraocular Movements: Extraocular movements intact.     Conjunctiva/sclera: Conjunctivae normal.     Pupils: Pupils are equal, round, and reactive to light.  Cardiovascular:     Rate and Rhythm: Normal rate and regular rhythm.     Heart sounds: No murmur heard. Pulmonary:     Effort: Pulmonary effort is normal.     Breath sounds: Normal breath sounds. No wheezing, rhonchi or rales.  Musculoskeletal:     Cervical back: Neck supple.  Lymphadenopathy:     Cervical: No cervical adenopathy.  Skin:    Capillary  Refill: Capillary refill takes less than 2 seconds.     Coloration: Skin is not jaundiced or pale.  Neurological:     General: No focal deficit present.     Mental Status: She is alert and oriented to person, place, and time.  Psychiatric:        Behavior: Behavior normal.     UC Treatments / Results  Labs (all labs ordered are listed, but only abnormal results are displayed) Labs Reviewed - No data to display  EKG   Radiology No results found.  Procedures Procedures (including critical care time)  Medications Ordered in UC Medications  triamcinolone acetonide (KENALOG-40) injection 40 mg (40 mg Intramuscular Given 08/05/21 1040)    Initial Impression / Assessment and Plan / UC Course  I have reviewed the triage vital signs and the nursing notes.  Pertinent labs & imaging results that were available during my care of the patient were reviewed by me and considered in my medical decision making (see chart for details).     Most likely allergic rhinitis, exacerbated by her exposure to the perfume. With no fever, will not do further testing swabs (and due to the length of symptoms) Final Clinical Impressions(s) / UC Diagnoses   Final diagnoses:  Non-seasonal allergic rhinitis due to other allergic trigger     Discharge Instructions      Continue your flonase  and allergy pill. Take benzonatate 100 mg 1 every 8 hours as needed for cough.     ED Prescriptions     Medication Sig Dispense Auth. Provider   benzonatate (TESSALON) 100 MG capsule Take 1 capsule (100 mg total) by mouth every 8 (eight) hours. 21 capsule Barrett Henle, MD      PDMP not reviewed this encounter.   Barrett Henle, MD 08/05/21 1034    Barrett Henle, MD 08/05/21 [phone]

## 2021-08-05 NOTE — ED Triage Notes (Signed)
Pt presents with a productive cough x 5 days.  States she has a headache and sinus pressure. States she did a COVID test that came out negative.   States she wants a refill on Flonase.

## 2021-08-15 ENCOUNTER — Ambulatory Visit: Payer: BC Managed Care – PPO | Admitting: Medical

## 2021-08-15 ENCOUNTER — Other Ambulatory Visit: Payer: Self-pay

## 2021-08-15 ENCOUNTER — Encounter: Payer: Self-pay | Admitting: Medical

## 2021-08-15 VITALS — BP 142/88 | HR 82 | Ht 64.0 in | Wt 168.2 lb

## 2021-08-15 DIAGNOSIS — Z23 Encounter for immunization: Secondary | ICD-10-CM | POA: Diagnosis not present

## 2021-08-15 DIAGNOSIS — M25562 Pain in left knee: Secondary | ICD-10-CM | POA: Diagnosis not present

## 2021-08-15 DIAGNOSIS — G8929 Other chronic pain: Secondary | ICD-10-CM

## 2021-08-15 NOTE — Patient Instructions (Signed)
Left knee pain-I suspect your symptoms are related to arthritis and wear and tear over the years  I do not see any sign to suggest a tear or ligament damage   Recommendations You can use ice water pack or bag of frozen peas 20 minutes on/20 minutes off for several days in a row when you have a flareup Rest when needed When you have a flareup avoid a lots of stairs and strenuous activity For pain you can use topical Voltaren gel or Capzaicin cream over-the-counter as needed once or twice daily You can use over-the-counter oral medication as needed for short periods of time such as 1 to 2 weeks at a time For example you could use Tylenol once or twice a day as needed You could also alternately use Aleve over-the-counter once or twice a day for 5 to 7 days at a time.  We want to avoid using anti-inflammatories like ibuprofen or Aleve for weeks and weeks due to potential damage to the kidneys Consider taking over-the-counter glucosamine chondroitin or over-the-counter fish oil daily as another way to help lubricate the joint and reduce pain Over the next month or 2 if none of these remedies help, or if the pain gets worse in the meantime, the next step would be x-ray and likely injection of steroid into the joint to give some comfort    Arthritis Arthritis means joint pain. It can also mean joint disease. A joint is a place where bones come together. There are more than 100 types of arthritis. What are the causes? This condition may be caused by: Wear and tear of a joint. This is the most common cause. A lot of acid in the blood, which leads to pain in the joint (gout). Pain and swelling (inflammation) in a joint. Infection of a joint. Injuries in the joint. A reaction to medicines (allergy). In some cases, the cause may not be known. What are the signs or symptoms? Symptoms of this condition include: Redness at a joint. Swelling at a joint. Stiffness at a joint. Warmth coming from  the joint. A fever. A feeling of being sick. How is this treated? This condition may be treated with: Treating the cause, if it is known. Rest. Raising (elevating) the joint. Putting cold or hot packs on the joint. Medicines to treat symptoms and reduce pain and swelling. Shots of medicines (cortisone) into the joint. You may also be told to make changes in your life, such as doing exercises and losing weight. Follow these instructions at home: Medicines Take over-the-counter and prescription medicines only as told by your doctor. Do not take aspirin for pain if your doctor says that you may have gout. Activity Rest your joint if your doctor tells you to. Avoid activities that make the pain worse. Exercise your joint regularly as told by your doctor. Try doing exercises like: Swimming. Water aerobics. Biking. Walking. Managing pain, stiffness, and swelling   If told, put ice on the affected area. Put ice in a plastic bag. Place a towel between your skin and the bag. Leave the ice on for 20 minutes, 2-3 times per day. If your joint is swollen, raise (elevate) it above the level of your heart if told by your doctor. If your joint feels stiff in the morning, try taking a warm shower. If told, put heat on the affected area. Do this as often as told by your doctor. Use the heat source that your doctor recommends, such as a moist heat pack  or a heating pad. If you have diabetes, do not apply heat without asking your doctor. To apply heat: Place a towel between your skin and the heat source. Leave the heat on for 20-30 minutes. Remove the heat if your skin turns bright red. This is very important if you are unable to feel pain, heat, or cold. You may have a greater risk of getting burned. General instructions Do not use any products that contain nicotine or tobacco, such as cigarettes, e-cigarettes, and chewing tobacco. If you need help quitting, ask your doctor. Keep all follow-up  visits as told by your doctor. This is important. Contact a doctor if: The pain gets worse. You have a fever. Get help right away if: You have very bad pain in your joint. You have swelling in your joint. Your joint is red. Many joints become painful and swollen. You have very bad back pain. Your leg is very weak. You cannot control your pee (urine) or poop (stool). Summary Arthritis means joint pain. It can also mean joint disease. A joint is a place where bones come together. The most common cause of this condition is wear and tear of a joint. Symptoms of this condition include redness, swelling, or stiffness of the joint. This condition is treated with rest, raising the joint, medicines, and putting cold or hot packs on the joint. Follow your doctor's instructions about medicines, activity, exercises, and other home care treatments. This information is not intended to replace advice given to you by your health care provider. Make sure you discuss any questions you have with your health care provider. Document Revised: 07/13/2018 Document Reviewed: 07/13/2018 Elsevier Patient Education  2022 [employer].

## 2021-08-15 NOTE — Progress Notes (Signed)
Subjective:  Holly [name] is a 65 y.o. female who presents for Chief Complaint  Patient presents with   Knee Pain    Left knee for 3 weeks comes and goes, is worse at the end of the day     Has some off and on pains in left knee in past but worse in last 3 weeks.   Rain seems to make it hurt.  Thinks it has had some swelling at times.   Using OTC cream for pain Voltaren, helps some, but next morning hurts again.   No recent injury, trauma or fall.  No pain in ankle or hip.   Using some tylenol from time to time as well.   No other aggravating or relieving factors.    She would like a covid booster today  No other c/o.  The following portions of the patient's history were reviewed and updated as appropriate: allergies, current medications, past family history, past medical history, past social history, past surgical history and problem list.  ROS Otherwise as in subjective above  Objective: BP (!) 142/88 (BP Location: Right Arm, Patient Position: Sitting)    Pulse 82    Ht 5\' 4"  (1.626 m)    Wt 168 lb 3.2 oz (76.3 kg)    SpO2 98%    BMI 28.87 kg/m   General appearance: alert, no distress, well developed, well nourished Left knee pain without obvious swelling or deformity Nontender to palpitation, no laxity, no discoloration or bruising She does have some pain with left knee extension but otherwise nontender Hips and ankles with normal range of motion without pain.  Rest of lower extremities unremarkable Normal lower extremity strength, sensation, pulses No LE edema     Assessment: Encounter Diagnoses  Name Primary?   Chronic pain of left knee Yes   Need for COVID-19 vaccine      Plan: Left knee pain  I suspect your symptoms are related to arthritis and wear and tear over the years  I do not see any sign to suggest a tear or ligament damage   Recommendations You can use ice water pack or bag of frozen peas 20 minutes on/20 minutes off for several days in a row when  you have a flareup Rest when needed When you have a flareup avoid a lots of stairs and strenuous activity For pain you can use topical Voltaren gel or Capzaicin cream over-the-counter as needed once or twice daily You can use over-the-counter oral medication as needed for short periods of time such as 1 to 2 weeks at a time For example you could use Tylenol once or twice a day as needed You could also alternately use Aleve over-the-counter once or twice a day for 5 to 7 days at a time.  We want to avoid using anti-inflammatories like ibuprofen or Aleve for weeks and weeks due to potential damage to the kidneys Consider taking over-the-counter glucosamine chondroitin or over-the-counter fish oil daily as another way to help lubricate the joint and reduce pain Over the next month or 2 if none of these remedies help, or if the pain gets worse in the meantime, the next step would be x-ray and likely injection of steroid into the joint to give some comfort  Counseled on the Covid virus vaccine.  Vaccine information sheet given.  Covid vaccine given after consent obtained.   Holly [name] was seen today for knee pain.  Diagnoses and all orders for this visit:  Chronic pain of left knee  Need for COVID-19 vaccine -     [employer] Vaccine Bivalent Booster    Follow up: prn

## 2021-08-21 IMAGING — MR MR HEAD W/O CM
10 series · 48 of 48 positions shown · non-contrast
Comparison: No pertinent prior exams available for comparison.

CLINICAL DATA: Memory loss. Additional history provided by scanning
technologist: Patient reports memory loss over the last few months.

EXAM:
MRI HEAD WITHOUT CONTRAST
TECHNIQUE: Multiplanar, multiecho pulse sequences of the brain and surrounding
structures were obtained without intravenous contrast.

[Series 2: T1 · sagittal · 5.0mm · 0.45mm/px · 2 of 21 slices shown]
[im 1/21]
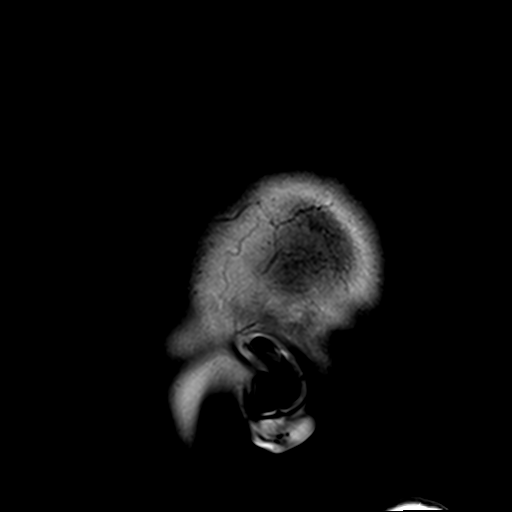
[im 21/21]
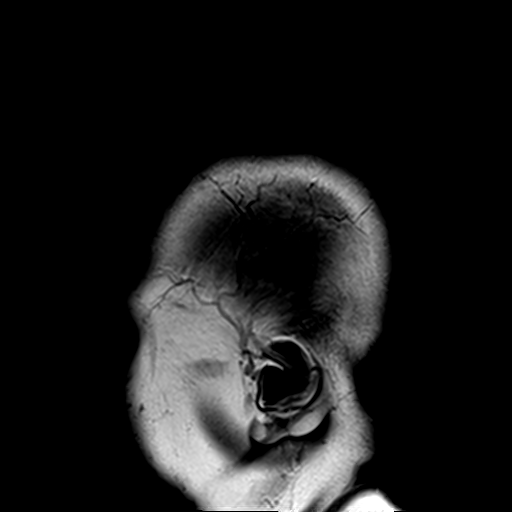

[Series 3: DWI · axial · 3.0mm · 1.80mm/px · z∈[-56,+90]mm · 9 of 99 slices shown (1 of 4)]
[im 1/99]
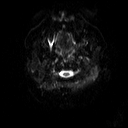
[im 13/99]
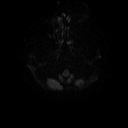
[im 25/99]
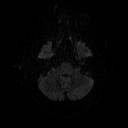
[im 37/99]
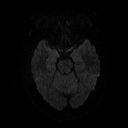
[im 50/99]
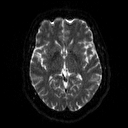
[im 62/99]
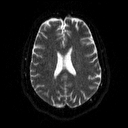
[im 74/99]
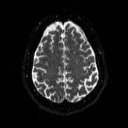
[im 86/99]
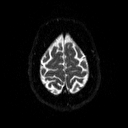
[im 99/99]
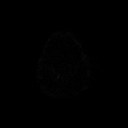

[Series 4: DWI · axial · 3.0mm · 1.80mm/px · z∈[-56,+90]mm · 4 of 48 slices shown (2 of 4)]
[im 1/48]
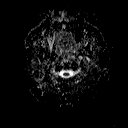
[im 16/48]
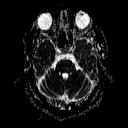
[im 32/48]
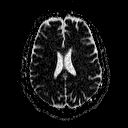
[im 48/48]
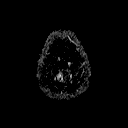

[Series 5: DWI · coronal · 5.0mm · 1.80mm/px · 7 of 74 slices shown (3 of 4)]
[im 1/74]
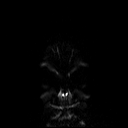
[im 13/74]
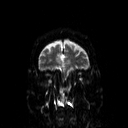
[im 25/74]
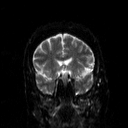
[im 37/74]
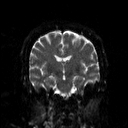
[im 49/74]
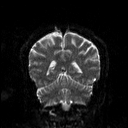
[im 61/74]
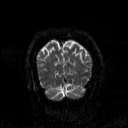
[im 74/74]
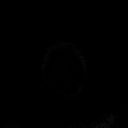

[Series 6: DWI · coronal · 5.0mm · 1.80mm/px · 3 of 37 slices shown (4 of 4)]
[im 1/37]
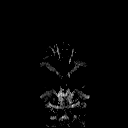
[im 19/37]
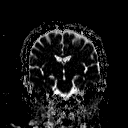
[im 37/37]
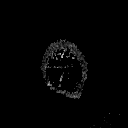

[Series 7: T2 · axial · 5.0mm · 0.60mm/px · z∈[-58,+89]mm · 2 of 22 slices shown (1 of 2)]
[im 1/22]
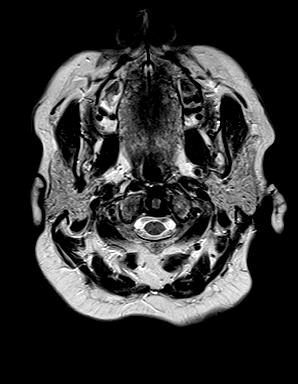
[im 22/22]
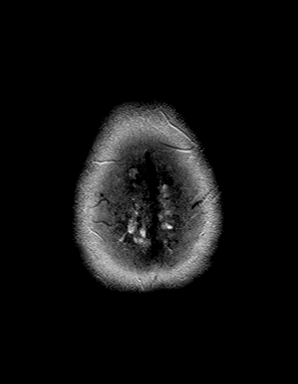

[Series 8: FLAIR · axial · 3.0mm · 0.45mm/px · z∈[-50,+84]mm · 3 of 30 slices shown]
[im 1/30]
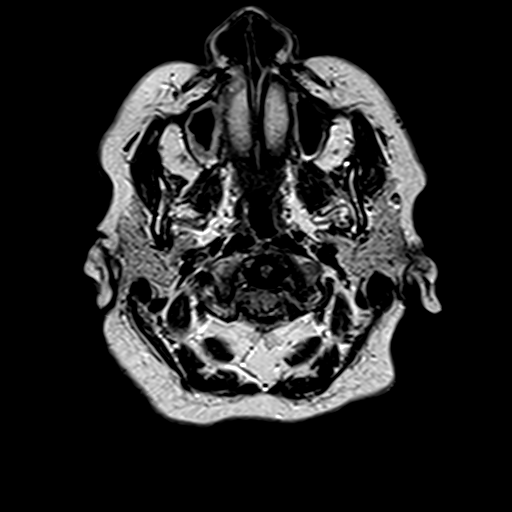
[im 15/30]
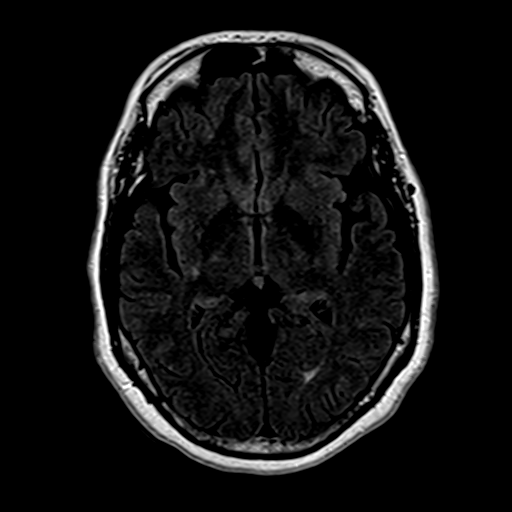
[im 30/30]
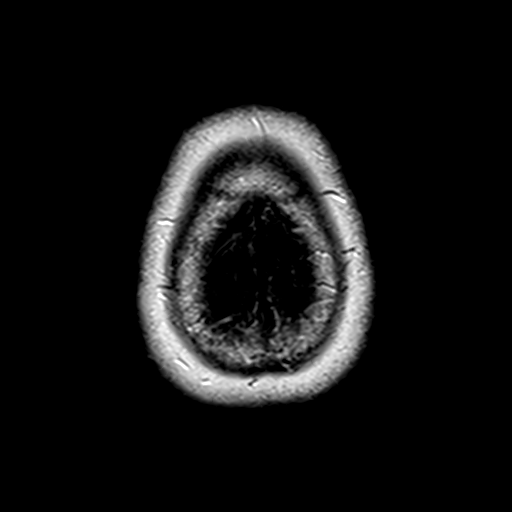

[Series 10: swi_images · axial · 4.0mm · 0.90mm/px · z∈[-53,+86]mm · 3 of 36 slices shown]
[im 1/36]
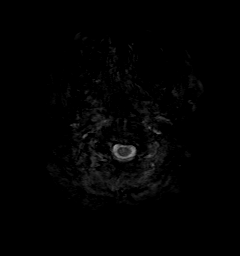
[im 18/36]
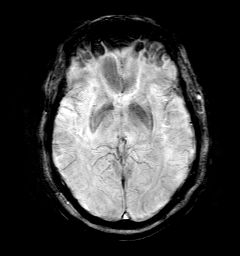
[im 36/36]
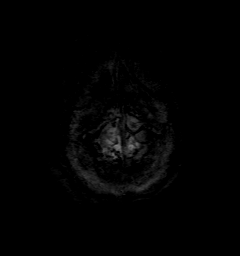

[Series 11: t1_mpr_tra · axial · 1.0mm · 0.71mm/px · z∈[-56,+87]mm · 13 of 144 slices shown]
[im 1/144]
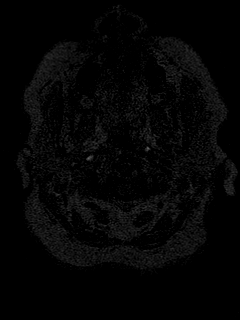
[im 12/144]
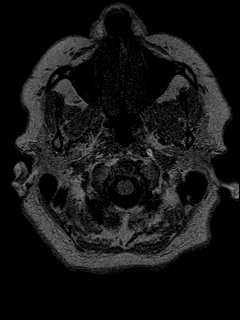
[im 24/144]
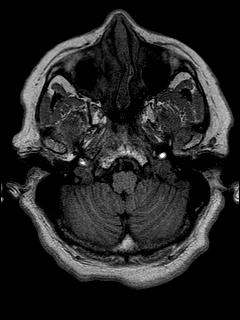
[im 36/144]
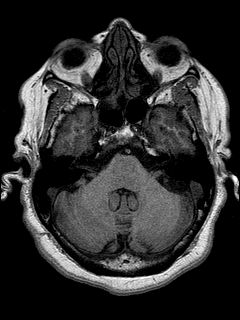
[im 48/144]
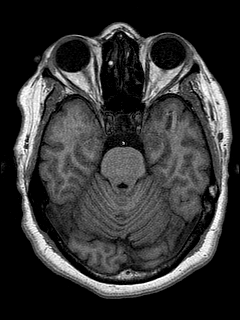
[im 60/144]
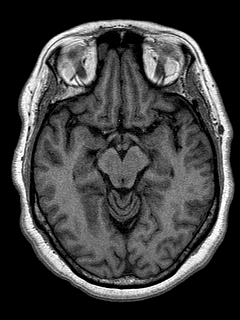
[im 72/144]
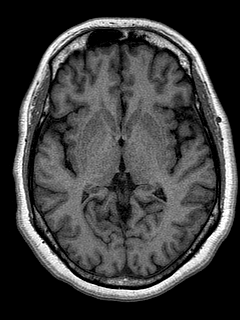
[im 84/144]
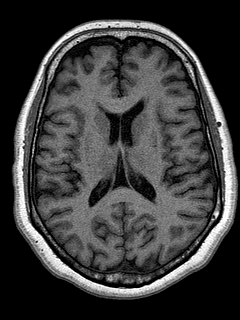
[im 96/144]
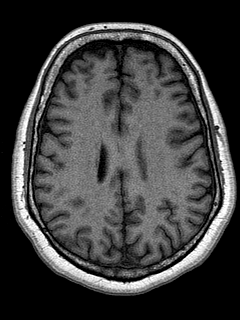
[im 108/144]
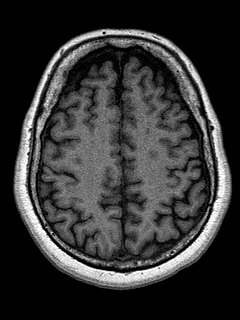
[im 120/144]
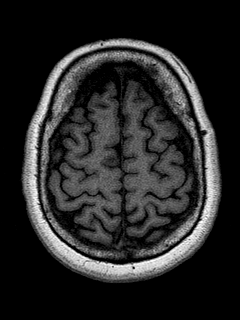
[im 132/144]
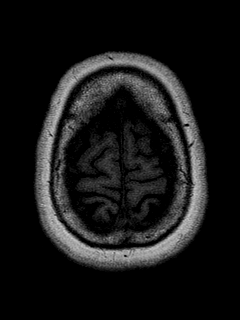
[im 144/144]
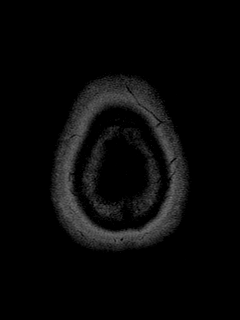

[Series 12: T2 · coronal · 5.0mm · 0.45mm/px · 2 of 25 slices shown (2 of 2)]
[im 1/25]
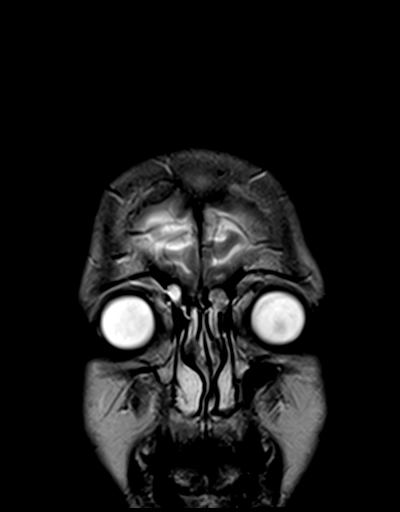
[im 25/25]
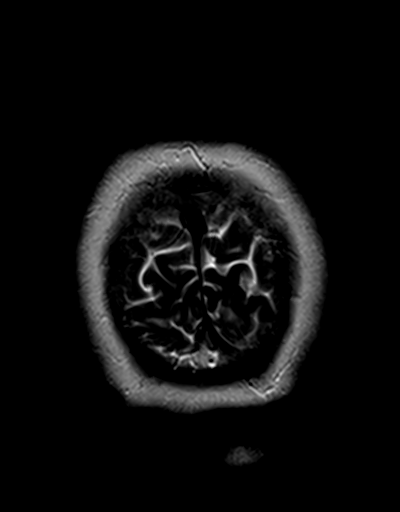

[48 of 48 positions shown; findings below may reference images not displayed]

FINDINGS: Brain:

Mild generalized cerebral and cerebellar atrophy. Cerebral atrophy
has no appreciable lobar predominance.

Mild multifocal T2/FLAIR hyperintensity within the cerebral white
matter, nonspecific but compatible with chronic small vessel
ischemic disease.

There is no acute infarct.

No evidence of intracranial mass.

No chronic intracranial blood products.

No extra-axial fluid collection.

No midline shift.

Vascular: Expected proximal arterial flow voids.

Skull and upper cervical spine: No focal marrow lesion.

Sinuses/Orbits: Visualized orbits show no acute finding. Prior right
lens replacement. Moderate right maxillary sinus mucosal thickening.
Trace left maxillary sinus mucosal thickening. Mild to moderate
bilateral frontoethmoidal sinus mucosal thickening. Subcentimeter
round T1 hyperintense focus within the right ethmoid sinus,
nonspecific but possibly reflecting a small proteinaceous mucous
retention cyst. Mild bilateral sphenoid sinus mucosal thickening.
IMPRESSION: No evidence of acute intracranial abnormality.

Mild generalized cerebral and cerebellar atrophy.

Minimal chronic small-vessel ischemic changes within the cerebral
white matter.

Paranasal sinus disease, as described.

## 2021-09-21 ENCOUNTER — Other Ambulatory Visit: Payer: Self-pay | Admitting: Medical

## 2021-10-12 ENCOUNTER — Other Ambulatory Visit: Payer: Self-pay | Admitting: Medical

## 2021-10-26 ENCOUNTER — Other Ambulatory Visit: Payer: Self-pay | Admitting: Medical

## 2021-10-26 NOTE — Telephone Encounter (Signed)
Lvm for pt to call back to find out if she needed before her appt 11/15/21. [place] ?

## 2021-11-15 ENCOUNTER — Encounter: Payer: Self-pay | Admitting: Medical

## 2021-11-15 ENCOUNTER — Ambulatory Visit: Payer: BC Managed Care – PPO | Admitting: Medical

## 2021-11-15 VITALS — BP 130/82 | HR 68 | Ht 64.0 in | Wt 156.0 lb

## 2021-11-15 DIAGNOSIS — Z79899 Other long term (current) drug therapy: Secondary | ICD-10-CM

## 2021-11-15 DIAGNOSIS — K5790 Diverticulosis of intestine, part unspecified, without perforation or abscess without bleeding: Secondary | ICD-10-CM | POA: Diagnosis not present

## 2021-11-15 DIAGNOSIS — Z0000 Encounter for general adult medical examination without abnormal findings: Secondary | ICD-10-CM | POA: Diagnosis not present

## 2021-11-15 DIAGNOSIS — M2550 Pain in unspecified joint: Secondary | ICD-10-CM

## 2021-11-15 DIAGNOSIS — Z800 Family history of malignant neoplasm of digestive organs: Secondary | ICD-10-CM

## 2021-11-15 DIAGNOSIS — M810 Age-related osteoporosis without current pathological fracture: Secondary | ICD-10-CM

## 2021-11-15 DIAGNOSIS — E785 Hyperlipidemia, unspecified: Secondary | ICD-10-CM

## 2021-11-15 DIAGNOSIS — E2839 Other primary ovarian failure: Secondary | ICD-10-CM | POA: Diagnosis not present

## 2021-11-15 DIAGNOSIS — E559 Vitamin D deficiency, unspecified: Secondary | ICD-10-CM

## 2021-11-15 DIAGNOSIS — Z87891 Personal history of nicotine dependence: Secondary | ICD-10-CM

## 2021-11-15 DIAGNOSIS — Z789 Other specified health status: Secondary | ICD-10-CM

## 2021-11-15 DIAGNOSIS — Z90710 Acquired absence of both cervix and uterus: Secondary | ICD-10-CM

## 2021-11-15 LAB — POCT URINALYSIS DIP (PROADVANTAGE DEVICE)
Bilirubin, UA: NEGATIVE
Blood, UA: NEGATIVE
Glucose, UA: NEGATIVE mg/dL
Ketones, POC UA: NEGATIVE mg/dL
Leukocytes, UA: NEGATIVE
Nitrite, UA: NEGATIVE
Protein Ur, POC: NEGATIVE mg/dL
Specific Gravity, Urine: 1.02
Urobilinogen, Ur: NEGATIVE
pH, UA: 6.5 (ref 5.0–8.0)

## 2021-11-15 NOTE — Progress Notes (Signed)
?Subjective:  ? ?HPI ? Holly [name] is a 66 y.o. female who presents for ?Chief Complaint  ?Patient presents with  ? fasting cpe  ?  Fasting cpe, had labs drawn this morning, no concerns, no breast or pelvic today  ? ? ?Patient Care Team: ?Cullin Dishman, Leward Quan as PCP - General (Family Medicine) ?Cameron Sprang, MD as Consulting Physician (Neurology) ?Elease Hashimoto (Neurology) ?Sees dentist ?Sees eye doctor ?Dr. Owens Loffler, GI ? ? ?Concerns: ?HTN -hypertension-compliant with medication without complaint.  Home BPs normal.  No chest pain no palpitations no edema ? ?Hyperlipidemia-compliant with medication without complaint ? ?Overall been doing well.  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   ? ?Vitamin D deficiency-she continues on supplement daily 2000 units daily ? ?Osteopenia-she continues on Fosamax ? ?Reviewed their medical, surgical, family, social, medication, and allergy history and updated chart as appropriate. ? ?Past Medical History:  ?Diagnosis Date  ? Chronic constipation   ? enrolled in a constipation study with [facility] 01/2013, on Plecanatide '6mg'$  daily  ? Diverticulosis 01/2013  ? on colonoscopy  ? Former smoker 2018  ? H/O echocardiogram 03/2014  ? normal, Dr. Tollie Eth  ? Herpetic lesion   ? 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 gets once to twice yearly  ? Hyperlipidemia   ? Hypertension   ? controlled off medication 2019  ? Menopausal disorder   ? Microscopic hematuria 04/2013  ? urology eval, no worrisome causes.  recheck only with frank hematuria.  ? Normal cardiac stress test 03/23/14  ? Dr. Tollie Eth  ? S/P hysterectomy   ? due to fibroids  ? Sleep difficulties   ? due to menopause  ? SOB (shortness of breath)   ? cardiac and PFT eval 03/2014  ? Vitamin D deficiency   ? Wears glasses   ? ? ?Family History  ?Problem Relation Age of Onset  ? Hyperlipidemia Mother   ? Hypertension Mother   ? Diabetes Mother   ? Dementia Mother   ? Hyperlipidemia Father    ? Hypertension Father   ? Diabetes Father   ? Cancer Father   ?     Stomach  ? Stomach cancer Father   ? Stroke Brother   ?     during surgery  ? Dementia Brother   ? Seizures Brother   ? Colon cancer Brother 28  ?     Twin  ? Heart disease Neg Hx   ? Esophageal cancer Neg Hx   ? Rectal cancer Neg Hx   ? ? ? ?Current Outpatient Medications:  ?  alendronate (FOSAMAX) 70 MG tablet, TAKE 1 TABLET BY MOUTH EVERY WEEK WITH A FULL GLASS OF WATER AND ON AN EMPTY STOMACH, Disp: 12 tablet, Rfl: 4 ?  calcium carbonate (OS-CAL) 600 MG TABS tablet, Take 600 mg by mouth daily with breakfast. , Disp: , Rfl:  ?  cholecalciferol (VITAMIN D3) 25 MCG (1000 UNIT) tablet, Take 2 tablets (2,000 Units total) by mouth daily., Disp: 180 tablet, Rfl: 3 ?  FLUoxetine (PROZAC) 10 MG capsule, TAKE 1 CAPSULE(10 MG) BY MOUTH DAILY, Disp: 90 capsule, Rfl: 0 ?  fluticasone (FLONASE) 50 MCG/ACT nasal spray, SHAKE LIQUID AND USE 1 SPRAY IN EACH NOSTRIL TWICE DAILY, Disp: 48 g, Rfl: 0 ?  Ginkgo-Ginseng-Gotu Kol-Lecith (GINKGO BILOBA PLUS-GINSENG) 50-150-250-50 MG TABS, Take 1 tablet by mouth daily., Disp: 30 tablet, Rfl: 2 ?  loratadine (CLARITIN) 10 MG tablet, Take 10  mg by mouth daily. , Disp: , Rfl:  ?  olmesartan-hydrochlorothiazide (BENICAR HCT) 40-12.5 MG tablet, Take 1 tablet by mouth daily., Disp: 90 tablet, Rfl: 3 ?  rosuvastatin (CRESTOR) 5 MG tablet, TAKE 1 TABLET BY MOUTH EVERY NIGHT AT BEDTIME, Disp: 90 tablet, Rfl: 0 ?  valACYclovir (VALTREX) 1000 MG tablet, Take 2 tablets (2,000 mg total) by mouth 2 (two) times daily., Disp: 30 tablet, Rfl: 2 ? ?Allergies  ?Allergen Reactions  ? Aspirin   ?  Upsets her stomach taking daily  ? Pravachol [Pravastatin Sodium]   ?  Leg aches  ? Penicillins Rash  ? ? ? ?Review of Systems ?Constitutional: -fever, -chills, -sweats, -unexpected weight change, -decreased appetite, -fatigue ?Allergy: -sneezing, -itching, -congestion ?Dermatology: -changing moles, --rash, -lumps ?ENT: -runny nose, -ear pain,  -sore throat, -hoarseness, -sinus pain, -teeth pain, - ringing in ears, -hearing loss, -nosebleeds ?Cardiology: -chest pain, -palpitations, -swelling, -difficulty breathing when lying flat, -waking up short of breath ?Respiratory: -cough, -shortness of breath, -difficulty breathing with exercise or exertion, -wheezing, -coughing up blood ?Gastroenterology: -abdominal pain, -nausea, -vomiting, -diarrhea, -constipation, -blood in stool, -changes in bowel movement, -difficulty swallowing or eating ?Hematology: -bleeding, -bruising  ?Musculoskeletal: -joint aches, -muscle aches, -joint swelling, -back pain, -neck pain, -cramping, -changes in gait ?Ophthalmology: denies vision changes, eye redness, itching, discharge ?Urology: -burning with urination, -difficulty urinating, -blood in urine, -urinary frequency, -urgency, -incontinence ?Neurology: -headache, -weakness, -tingling, -numbness, -memory at times a concern, -falls, -dizziness ?Psychology: -depressed mood, -agitation, -sleep problems ?Breast/gyn: -breast tendnerss, -discharge, -lumps, -vaginal discharge,- irregular periods, -heavy periods ? ?   ? ?Objective:  ?BP 130/82   Pulse 68   Ht '5\' 4"'$  (1.626 m)   Wt 156 lb (70.8 kg)   BMI 26.78 kg/m?  ? ?BP Readings from Last 3 Encounters:  ?11/15/21 130/82  ?08/15/21 (!) 142/88  ?08/05/21 135/82  ? ?Wt Readings from Last 3 Encounters:  ?11/15/21 156 lb (70.8 kg)  ?08/15/21 168 lb 3.2 oz (76.3 kg)  ?06/05/21 165 lb (74.8 kg)  ? ? ? ?General appearance: alert, no distress, WD/WN, African American female ?Skin: right lateral upper face just lateral to eyelid with raised round brown 47m diameter papular lesion unchanged for years. ?HEENT: normocephalic, conjunctiva/corneas normal, sclerae anicteric, PERRLA, EOMi, nares patent, no discharge or erythema, pharynx normal ?Oral cavity: MMM, tongue normal, teeth normal ?Neck: supple, no lymphadenopathy, no thyromegaly, no masses, normal ROM, no bruits ?Chest: non tender, normal  shape and expansion ?Heart: RRR, normal S1, S2, no murmurs ?Lungs: CTA bilaterally, no wheezes, rhonchi, or rales ?Abdomen: +bs, soft, non tender, non distended, no masses, no hepatomegaly, no splenomegaly, no bruits ?Back: non tender, normal ROM, no scoliosis ?Musculoskeletal: upper extremities non tender, no obvious deformity, normal ROM throughout, lower extremities non tender, no obvious deformity, normal ROM throughout ?Extremities: no edema, no cyanosis, no clubbing ?Pulses: 2+ symmetric, upper and lower extremities, normal cap refill ?Neurological: alert, oriented x 3, CN2-12 intact, strength normal upper extremities and lower extremities, sensation normal throughout, DTRs 2+ throughout, no cerebellar signs, gait normal ?Psychiatric: normal affect, behavior normal, pleasant  ?Breast/gyn/rectal - deferred at her request today ? ? ? ?Assessment and Plan :  ? ?Encounter Diagnoses  ?Name Primary?  ? Routine general medical examination at a health care facility Yes  ? Diverticulosis   ? Estrogen deficiency   ? Family history of colon cancer   ? High risk medication use   ? Hyperlipidemia, unspecified hyperlipidemia type   ? Osteoporosis, unspecified osteoporosis type, unspecified  pathological fracture presence   ? Vitamin D deficiency   ? S/P hysterectomy   ? Polyarthralgia   ? Statin intolerance   ? Former smoker   ? ? ?Today you had a preventative care visit or wellness visit.    Topics today may have included healthy lifestyle, diet, exercise, preventative care, vaccinations, sick and well care, proper use of emergency dept and after hours care, as well as other concerns.   ? ? ?Recommendations: ?Continue to return yearly for your annual wellness and preventative care visits.  This gives [place] a chance to discuss healthy lifestyle, exercise, vaccinations, review your chart record, and perform screenings where appropriate. ? ?I recommend you see your eye doctor yearly for routine vision care. ? ?I recommend you see  your dentist yearly for routine dental care including hygiene visits twice yearly. ? ? ?Vaccination recommendations were reviewed ?Immunization History  ?Administered Date(s) Administered  ? Influenza,inj

## 2021-11-15 NOTE — Patient Instructions (Signed)
?Today you had a preventative care visit or wellness visit.    Topics today may have included healthy lifestyle, diet, exercise, preventative care, vaccinations, sick and well care, proper use of emergency dept and after hours care, as well as other concerns.   ? ? ?Recommendations: ?Continue to return yearly for your annual wellness and preventative care visits.  This gives [place] a chance to discuss healthy lifestyle, exercise, vaccinations, review your chart record, and perform screenings where appropriate. ? ?I recommend you see your eye doctor yearly for routine vision care. ? ?I recommend you see your dentist yearly for routine dental care including hygiene visits twice yearly. ? ? ?Vaccination recommendations were reviewed ?Immunization History  ?Administered Date(s) Administered  ? Influenza,inj,Quad PF,6+ Mos 08/15/2016, 05/13/2017, 05/11/2018, 04/17/2019, 04/17/2019, 06/05/2021  ? Influenza-Unspecified 06/03/2020  ? PFIZER(Purple Top)SARS-COV-2 Vaccination 11/06/2019, 02/12/2020, 05/15/2020  ? Pension scheme manager 67yr & up 08/15/2021  ? Pneumococcal Polysaccharide-23 06/23/2012  ? Tdap 08/20/2003, 06/29/2013  ? Zoster Recombinat (Shingrix) 08/28/2017, 08/21/2018  ? ? ? ? ?Screening for cancer: ?Breast cancer screening: ?You should perform a self breast exam monthly.   ?We reviewed recommendations for regular mammograms and breast cancer screening. ?Mammogram from 02/2021 reviewed, normal ? ?Colon cancer screening:  ?Reviewed 11/2019 colonoscopy with Dr. JArdis Hughs polyps included Tubular Adenoma, repeat 5 years  ? ?You have had a hysterectomy and no longer need pap smear test.    You should have a breast and pelvic exams periodically.  You are due for this ? ?Skin cancer screening: ?Check your skin regularly for new changes, growing lesions, or other lesions of concern ?Come in for evaluation if you have skin lesions of concern. ? ?Lung cancer screening: ?If you have a greater than 30 pack  year history of tobacco use, then you qualify for lung cancer screening with a chest CT scan ? ?We currently don't have screenings for other cancers besides breast, cervical, colon, and lung cancers.  If you have a strong family history of cancer or have other cancer screening concerns, please let me know.  ? ? ?Bone health: ?Get at least 150 minutes of aerobic exercise weekly ?Get weight bearing exercise at least once weekly ?Osteopenia with improvement in bone density on 08/2019 exam.  Plan to repeat bone density test 08/2021.   ? ?Fosamax started 07/2017.  Continue current medication and recheck bone density 08/2021. ? ? ?Please call to schedule your bone density test.  ? ?[facility] ?[phone]?[address] ?[place] [id number]? ? ?Heart health: ?Get at least 150 minutes of aerobic exercise weekly ?Limit alcohol ?It is important to maintain a healthy blood pressure and healthy cholesterol numbers ? ? ? ?Advanced Directives: ?I recommend you consider completing a [place] Living Will.   These documents respect your wishes and help alleviate burdens on your loved ones if you were to become terminally ill or be in a position to need those documents enforced.   ? ?You can complete Advanced Directives yourself, have them notarized, then have copies made for our office, for you and for anybody you feel should have them in safe keeping. ? ?Or, you can have an attorney prepare these documents.   If you haven't updated your Last Will and Testament in a while, it may be worthwhile having an attorney prepare these documents together and save on some costs.    ? ? ? ?Separate significant issues discussed: ?Hypertension-blood  pressure is at goal today, continue current medication ? ?Hyperlipidemia-fasting labs today, continue statin ? ?Vitamin D deficiency-updated labs today, continue supplement 2000 units daily ? ?

## 2021-11-16 ENCOUNTER — Other Ambulatory Visit: Payer: Self-pay | Admitting: Medical

## 2021-11-16 DIAGNOSIS — Z79899 Other long term (current) drug therapy: Secondary | ICD-10-CM

## 2021-11-16 DIAGNOSIS — M810 Age-related osteoporosis without current pathological fracture: Secondary | ICD-10-CM

## 2021-11-16 DIAGNOSIS — E2839 Other primary ovarian failure: Secondary | ICD-10-CM

## 2021-11-16 DIAGNOSIS — Z0000 Encounter for general adult medical examination without abnormal findings: Secondary | ICD-10-CM

## 2021-11-16 MED ORDER — VALACYCLOVIR HCL 1 G PO TABS: 2000.0000 mg | ORAL_TABLET | Freq: Two times a day (BID) | ORAL | 2 refills | Status: DC

## 2021-11-16 MED ORDER — FLUTICASONE PROPIONATE 50 MCG/ACT NA SUSP: 1.0000 | Freq: Every day | NASAL | 3 refills | Status: DC

## 2021-11-16 MED ORDER — FLUOXETINE HCL 10 MG PO CAPS: ORAL_CAPSULE | ORAL | 3 refills | Status: DC

## 2021-11-16 MED ORDER — OLMESARTAN MEDOXOMIL-HCTZ 40-12.5 MG PO TABS: 1.0000 | ORAL_TABLET | Freq: Every day | ORAL | 3 refills | Status: DC

## 2021-11-19 ENCOUNTER — Other Ambulatory Visit: Payer: Self-pay | Admitting: Medical

## 2021-11-19 MED ORDER — VITAMIN D 50 MCG (2000 UT) PO CAPS: 1.0000 | ORAL_CAPSULE | Freq: Every day | ORAL | 3 refills | Status: DC

## 2021-11-19 MED ORDER — ROSUVASTATIN CALCIUM 10 MG PO TABS: 10.0000 mg | ORAL_TABLET | Freq: Every day | ORAL | 3 refills | Status: DC

## 2021-11-20 LAB — COMPREHENSIVE METABOLIC PANEL
ALT: 7 IU/L (ref 0–32)
AST: 19 IU/L (ref 0–40)
Albumin/Globulin Ratio: 1.7 (ref 1.2–2.2)
Albumin: 4.5 g/dL (ref 3.8–4.8)
Alkaline Phosphatase: 71 IU/L (ref 44–121)
BUN/Creatinine Ratio: 17 (ref 12–28)
BUN: 12 mg/dL (ref 8–27)
Bilirubin Total: <0.2 mg/dL (ref 0.0–1.2)
CO2: 24 mmol/L (ref 20–29)
Calcium: 10 mg/dL (ref 8.7–10.3)
Chloride: 105 mmol/L (ref 96–106)
Creatinine, Ser: 0.72 mg/dL (ref 0.57–1.00)
Globulin, Total: 2.7 g/dL (ref 1.5–4.5)
Glucose: 89 mg/dL (ref 70–99)
Potassium: 4.9 mmol/L (ref 3.5–5.2)
Sodium: 146 mmol/L — ABNORMAL HIGH (ref 134–144)
Total Protein: 7.2 g/dL (ref 6.0–8.5)
eGFR: 94 mL/min/{1.73_m2} (ref >59)

## 2021-11-20 LAB — CBC
Hematocrit: 41.7 % (ref 34.0–46.6)
Hemoglobin: 13.6 g/dL (ref 11.1–15.9)
MCH: 28.7 pg (ref 26.6–33.0)
MCHC: 32.6 g/dL (ref 31.5–35.7)
MCV: 88 fL (ref 79–97)
Platelets: 293 10*3/uL (ref 150–450)
RBC: 4.74 x10E6/uL (ref 3.77–5.28)
RDW: 13.2 % (ref 11.7–15.4)
WBC: 7.7 10*3/uL (ref 3.4–10.8)

## 2021-11-20 LAB — LIPID PANEL
Chol/HDL Ratio: 3.8 ratio (ref 0.0–4.4)
Cholesterol, Total: 188 mg/dL (ref 100–199)
HDL: 50 mg/dL (ref >39)
LDL Chol Calc (NIH): 125 mg/dL — ABNORMAL HIGH (ref 0–99)
Triglycerides: 72 mg/dL (ref 0–149)
VLDL Cholesterol Cal: 13 mg/dL (ref 5–40)

## 2021-11-20 LAB — VITAMIN D 25 HYDROXY (VIT D DEFICIENCY, FRACTURES): Vit D, 25-Hydroxy: 48.1 ng/mL (ref 30.0–100.0)

## 2021-11-22 ENCOUNTER — Encounter: Payer: Self-pay | Admitting: Family Medicine

## 2021-11-22 ENCOUNTER — Ambulatory Visit: Payer: BC Managed Care – PPO | Admitting: Family Medicine

## 2021-11-22 VITALS — BP 120/72 | HR 72 | Ht 63.0 in | Wt 154.2 lb

## 2021-11-22 DIAGNOSIS — M7918 Myalgia, other site: Secondary | ICD-10-CM
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
## 2022-01-21 ENCOUNTER — Ambulatory Visit: Payer: BC Managed Care – PPO | Admitting: Medical

## 2022-01-21 VITALS — BP 120/80 | HR 66 | Temp 97.9°F | Wt 159.0 lb

## 2022-01-21 DIAGNOSIS — K439 Ventral hernia without obstruction or gangrene: Secondary | ICD-10-CM | POA: Diagnosis not present

## 2022-01-21 DIAGNOSIS — R1010 Upper abdominal pain, unspecified: Secondary | ICD-10-CM | POA: Diagnosis not present
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
## 2022-01-23 LAB — BASIC METABOLIC PANEL
BUN/Creatinine Ratio: 20 (ref 12–28)
BUN: 11 mg/dL (ref 8–27)
CO2: 22 mmol/L (ref 20–29)
Calcium: 9 mg/dL (ref 8.7–10.3)
Chloride: 103 mmol/L (ref 96–106)
Creatinine, Ser: 0.54 mg/dL — ABNORMAL LOW (ref 0.57–1.00)
Glucose: 87 mg/dL (ref 70–99)
Potassium: 4 mmol/L (ref 3.5–5.2)
Sodium: 141 mmol/L (ref 134–144)
eGFR: 103 mL/min/{1.73_m2} (ref >59)

## 2022-02-13 ENCOUNTER — Ambulatory Visit
Admission: RE | Admit: 2022-02-13 | Discharge: 2022-02-13 | Disposition: A | Discharge status: Home / Self Care | Payer: BC Managed Care – PPO | Source: Ambulatory Visit | Attending: Medical | Admitting: Medical

## 2022-02-13 ENCOUNTER — Other Ambulatory Visit: Payer: Self-pay | Admitting: Medical

## 2022-02-13 DIAGNOSIS — R1010 Upper abdominal pain, unspecified: Secondary | ICD-10-CM

## 2022-02-13 DIAGNOSIS — K439 Ventral hernia without obstruction or gangrene: Secondary | ICD-10-CM

## 2022-02-13 MED ORDER — IOPAMIDOL (ISOVUE-300) INJECTION 61%
100.0000 mL | Freq: Once | INTRAVENOUS | Status: AC | PRN
  Administered 2022-02-13: 100 mL via INTRAVENOUS

## 2022-02-15 ENCOUNTER — Other Ambulatory Visit: Payer: Self-pay | Admitting: Internal Medicine

## 2022-02-15 DIAGNOSIS — K469 Unspecified abdominal hernia without obstruction or gangrene: Secondary | ICD-10-CM

## 2022-03-28 LAB — HM MAMMOGRAPHY

## 2022-04-02 ENCOUNTER — Encounter: Payer: Self-pay | Admitting: Internal Medicine

## 2022-04-18 ENCOUNTER — Encounter (HOSPITAL_COMMUNITY): Payer: Self-pay

## 2022-04-18 ENCOUNTER — Ambulatory Visit (HOSPITAL_COMMUNITY)
Admission: EM | Admit: 2022-04-18 | Discharge: 2022-04-18 | Disposition: A | Discharge status: Home / Self Care | Payer: BC Managed Care – PPO | Attending: Sports Medicine | Admitting: Sports Medicine

## 2022-04-18 DIAGNOSIS — R519 Headache, unspecified: Secondary | ICD-10-CM | POA: Diagnosis not present

## 2022-04-18 MED ORDER — KETOROLAC TROMETHAMINE 30 MG/ML IJ SOLN
30.0000 mg | Freq: Once | INTRAMUSCULAR | Status: AC
Start: 2022-04-18 — End: 2022-04-18
  Administered 2022-04-18: 30 mg via INTRAMUSCULAR

## 2022-04-18 MED ORDER — KETOROLAC TROMETHAMINE 30 MG/ML IJ SOLN
INTRAMUSCULAR | Status: AC
  Filled 2022-04-18: qty 1

## 2022-04-18 MED ORDER — DM-GUAIFENESIN ER 30-600 MG PO TB12: 1.0000 | ORAL_TABLET | Freq: Two times a day (BID) | ORAL | Status: DC

## 2022-04-18 MED ORDER — GUAIFENESIN ER 600 MG PO TB12: 600.0000 mg | ORAL_TABLET | Freq: Two times a day (BID) | ORAL | 0 refills | Status: DC

## 2022-04-18 MED ORDER — METHYLPREDNISOLONE 4 MG PO TBPK: ORAL_TABLET | ORAL | 0 refills | Status: DC

## 2022-04-18 NOTE — ED Triage Notes (Signed)
Pt c/o dry cough with frontal headaches and pressure behind her eyes since Sunday. States using her Flonase and allergy meds with no relief.
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
## 2022-04-18 NOTE — Discharge Instructions (Addendum)
You likely have a viral respiratory infection causing your sinus pressure and headache.  Today you received a Toradol shot for pain relief.  Avoid any other NSAIDs such as ibuprofen, Motrin or Aleve today.  You may take Tylenol if you have further pain today.  I have prescribed a steroid taper to your pharmacy, take as directed on the box.  This may help some of your facial pain. I also recommend picking up a decongestant at the drugstore.  If your symptoms worsen or do not improve over the next 5 days recommend evaluation at your primary care office or back at urgent care.

## 2022-04-26 ENCOUNTER — Other Ambulatory Visit: Payer: Self-pay | Admitting: Medical

## 2022-05-07 ENCOUNTER — Ambulatory Visit
Admission: RE | Admit: 2022-05-07 | Discharge: 2022-05-07 | Disposition: A | Discharge status: Home / Self Care | Payer: BC Managed Care – PPO | Source: Ambulatory Visit | Attending: Medical | Admitting: Medical

## 2022-05-07 DIAGNOSIS — E2839 Other primary ovarian failure: Secondary | ICD-10-CM

## 2022-05-07 DIAGNOSIS — Z0000 Encounter for general adult medical examination without abnormal findings: Secondary | ICD-10-CM

## 2022-05-07 DIAGNOSIS — M810 Age-related osteoporosis without current pathological fracture: Secondary | ICD-10-CM

## 2022-05-07 DIAGNOSIS — Z79899 Other long term (current) drug therapy: Secondary | ICD-10-CM

## 2022-05-08 ENCOUNTER — Other Ambulatory Visit: Payer: BC Managed Care – PPO

## 2022-06-11 ENCOUNTER — Other Ambulatory Visit (INDEPENDENT_AMBULATORY_CARE_PROVIDER_SITE_OTHER): Payer: BC Managed Care – PPO

## 2022-06-11 DIAGNOSIS — Z23 Encounter for immunization: Secondary | ICD-10-CM | POA: Diagnosis not present

## 2022-06-13 ENCOUNTER — Encounter: Payer: Self-pay | Admitting: Medical

## 2022-06-13 ENCOUNTER — Ambulatory Visit: Payer: BC Managed Care – PPO | Admitting: Medical

## 2022-06-13 VITALS — BP 132/84 | HR 76 | Temp 98.6°F | Wt 164.8 lb

## 2022-06-13 DIAGNOSIS — T24202A Burn of second degree of unspecified site of left lower limb, except ankle and foot, initial encounter: Secondary | ICD-10-CM

## 2022-06-13 DIAGNOSIS — Z23 Encounter for immunization: Secondary | ICD-10-CM | POA: Diagnosis not present
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
## 2022-06-27 ENCOUNTER — Other Ambulatory Visit: Payer: BC Managed Care – PPO

## 2022-08-13 ENCOUNTER — Ambulatory Visit: Payer: BC Managed Care – PPO | Admitting: Podiatry

## 2022-08-20 ENCOUNTER — Ambulatory Visit: Payer: BC Managed Care – PPO | Admitting: Podiatry

## 2022-08-20 DIAGNOSIS — L600 Ingrowing nail: Secondary | ICD-10-CM | POA: Diagnosis not present

## 2022-08-20 MED ORDER — NEOMYCIN-POLYMYXIN-HC 3.5-10000-1 OT SUSP: OTIC | 0 refills | Status: DC

## 2022-08-20 NOTE — Patient Instructions (Signed)

## 2022-08-20 NOTE — Progress Notes (Signed)
  Subjective:  Patient ID: Olegario Messier, female    DOB: 12-11-1955,  MRN: [id number]  Chief Complaint  Patient presents with   Ingrown Toenail    Bilateral great ingrown toenail, left > right -patient request first available , she needed an appointment asap -     67 y.o. female presents with the above complaint. History confirmed with patient.  The right side is doing okay the left side is worse  Objective:  Physical Exam: warm, good capillary refill, no trophic changes or ulcerative lesions, normal DP and PT pulses, normal sensory exam, and painful ingrown nail medial border of hallux.  Assessment:   1. Ingrown left big toenail      Plan:  Patient was evaluated and treated and all questions answered.    Ingrown Nail, left -Patient elects to proceed with minor surgery to remove ingrown toenail today. Consent reviewed and signed by patient. -Ingrown nail excised. See procedure note. -Educated on post-procedure care including soaking. Written instructions provided and reviewed. -Rx for Cortisporin sent to pharmacy. -Advised on signs and symptoms of infection developing.  We discussed that the phenol likely will create some redness and edema and tenderness around the nailbed as long as it is localized this is to be expected.  Will return as needed if any infection signs develop  Procedure: Excision of Ingrown Toenail Location: Left 1st toe medial nail borders. Anesthesia: Lidocaine 1% plain; 1.5 mL and Marcaine 0.5% plain; 1.5 mL, digital block. Skin Prep: Betadine. Dressing: Silvadene; telfa; dry, sterile, compression dressing. Technique: Following skin prep, the toe was exsanguinated and a tourniquet was secured at the base of the toe. The affected nail border was freed, split with a nail splitter, and excised. Chemical matrixectomy was then performed with phenol and irrigated out with alcohol. The tourniquet was then removed and sterile dressing applied. Disposition: Patient  tolerated procedure well.    Return if symptoms worsen or fail to improve.

## 2022-10-02 ENCOUNTER — Other Ambulatory Visit: Payer: Self-pay | Admitting: Medical

## 2022-11-25 ENCOUNTER — Other Ambulatory Visit: Payer: Self-pay | Admitting: Medical

## 2022-11-25 NOTE — Telephone Encounter (Signed)
Left message for pt to call back to schedule a visit.  

## 2022-12-20 ENCOUNTER — Other Ambulatory Visit: Payer: Self-pay | Admitting: Medical

## 2023-02-24 ENCOUNTER — Other Ambulatory Visit: Payer: Self-pay | Admitting: Medical

## 2023-02-24 NOTE — Telephone Encounter (Signed)
Pt is scheduled next week for a appt

## 2023-03-03 ENCOUNTER — Encounter: Payer: Self-pay | Admitting: Medical

## 2023-03-03 ENCOUNTER — Ambulatory Visit (INDEPENDENT_AMBULATORY_CARE_PROVIDER_SITE_OTHER): Payer: BC Managed Care – PPO | Admitting: Medical

## 2023-03-03 VITALS — BP 110/64 | HR 84 | Ht 64.0 in | Wt 168.6 lb

## 2023-03-03 DIAGNOSIS — E559 Vitamin D deficiency, unspecified: Secondary | ICD-10-CM | POA: Diagnosis not present

## 2023-03-03 DIAGNOSIS — Z0000 Encounter for general adult medical examination without abnormal findings: Secondary | ICD-10-CM

## 2023-03-03 DIAGNOSIS — M810 Age-related osteoporosis without current pathological fracture: Secondary | ICD-10-CM

## 2023-03-03 DIAGNOSIS — M542 Cervicalgia: Secondary | ICD-10-CM | POA: Insufficient documentation

## 2023-03-03 DIAGNOSIS — E785 Hyperlipidemia, unspecified: Secondary | ICD-10-CM | POA: Diagnosis not present

## 2023-03-03 DIAGNOSIS — Z1231 Encounter for screening mammogram for malignant neoplasm of breast: Secondary | ICD-10-CM

## 2023-03-03 DIAGNOSIS — Z789 Other specified health status: Secondary | ICD-10-CM

## 2023-03-03 DIAGNOSIS — R413 Other amnesia: Secondary | ICD-10-CM | POA: Insufficient documentation

## 2023-03-03 DIAGNOSIS — M2550 Pain in unspecified joint: Secondary | ICD-10-CM | POA: Insufficient documentation

## 2023-03-03 LAB — POCT URINALYSIS DIP (PROADVANTAGE DEVICE)
Bilirubin, UA: NEGATIVE
Blood, UA: NEGATIVE
Glucose, UA: NEGATIVE mg/dL
Ketones, POC UA: NEGATIVE mg/dL
Leukocytes, UA: NEGATIVE
Nitrite, UA: NEGATIVE
Protein Ur, POC: NEGATIVE mg/dL
Specific Gravity, Urine: 1.005
Urobilinogen, Ur: NEGATIVE
pH, UA: 6.5 (ref 5.0–8.0)

## 2023-03-03 NOTE — Progress Notes (Signed)
Subjective:   HPI  Holly [name] is a 67 y.o. female who presents for Chief Complaint  Patient presents with   Annual Exam    Nonfasting cpe, body aches, having issues focusing and would like a Brain MRI and neck    Patient Care Team: Christene Pounds, Cleda Mccreedy as PCP - General (Family Medicine) Van Clines, MD as Consulting Physician (Neurology) Gwynneth Munson Corrie Dandy (Neurology) Sees dentist Sees eye doctor Dr. Rob Bunting, GI   Concerns She has concerns about lack of focus at times, sometimes forgets things.  Her mother has dementia and she is her caregiver.  She is curious if she needs an updated head scan.  She is still working 2 different jobs in addition to caring for her mother.  HTN -hypertension-compliant with medication without complaint.  Home BPs normal.  No chest pain no palpitations no edema  Hyperlipidemia-compliant with medication without complaint  Overall been doing well.  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    Vitamin D deficiency-she continues on supplement daily 2000 units daily  Osteoporosis - she continues on Fosamax.  Her bones feel achy at times  Reviewed their medical, surgical, family, social, medication, and allergy history and updated chart as appropriate.  Past Medical History:  Diagnosis Date   Chronic constipation    enrolled in a constipation study with [facility] 01/2013, on Plecanatide 6mg  daily   Diverticulosis 01/2013   on colonoscopy   Former smoker 2018   H/O echocardiogram 03/2014   normal, Dr. 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 gets once to twice yearly   Hyperlipidemia    Hypertension    controlled off medication 2019   Menopausal disorder    Microscopic hematuria 04/2013   urology eval, no worrisome causes.  recheck only with frank hematuria.   Normal cardiac stress test 03/23/14   Dr. Viann Fish   S/P hysterectomy    due to fibroids   Sleep difficulties     due to menopause   SOB (shortness of breath)    cardiac and PFT eval 03/2014   Vitamin D deficiency    Wears glasses     Family History  Problem Relation Age of Onset   Hyperlipidemia Mother    Hypertension Mother    Diabetes Mother    Dementia Mother    Hyperlipidemia Father    Hypertension Father    Diabetes Father    Cancer Father        Stomach   Stomach cancer Father    Stroke Brother        during surgery   Dementia Brother    Seizures Brother    Colon cancer Brother 86       Twin   Heart disease Neg Hx    Esophageal cancer Neg Hx    Rectal cancer Neg Hx      Current Outpatient Medications:    acetaminophen (TYLENOL) 500 MG tablet, Take 1,000 mg by mouth every 6 (six) hours as needed., Disp: , Rfl:    alendronate (FOSAMAX) 70 MG tablet, TAKE 1 TABLET BY MOUTH EVERY WEEK WITH A FULL GLASS OF WATER AND ON AN EMPTY STOMACH, Disp: 12 tablet, Rfl: 1   calcium carbonate (OS-CAL) 600 MG TABS tablet, Take 600 mg by mouth daily with breakfast. , Disp: , Rfl:    Cholecalciferol (VITAMIN D) 50 MCG (2000 UT) CAPS, Take 1 capsule (2,000 Units total)  by mouth daily., Disp: 90 capsule, Rfl: 3   FLUoxetine (PROZAC) 10 MG capsule, TAKE 1 CAPSULE(10 MG) BY MOUTH DAILY, Disp: 90 capsule, Rfl: 3   fluticasone (FLONASE) 50 MCG/ACT nasal spray, Place 1 spray into both nostrils daily., Disp: 48 g, Rfl: 3   Ginkgo-Ginseng-Gotu Kol-Lecith (GINKGO BILOBA PLUS-GINSENG) 50-150-250-50 MG TABS, Take 1 tablet by mouth daily., Disp: 30 tablet, Rfl: 2   loratadine (CLARITIN) 10 MG tablet, Take 10 mg by mouth daily. , Disp: , Rfl:    olmesartan-hydrochlorothiazide (BENICAR HCT) 40-12.5 MG tablet, Take 1 tablet by mouth daily., Disp: 90 tablet, Rfl: 3   rosuvastatin (CRESTOR) 10 MG tablet, TAKE 1 TABLET(10 MG) BY MOUTH DAILY, Disp: 30 tablet, Rfl: 5   valACYclovir (VALTREX) 1000 MG tablet, TAKE 2 TABLETS(2000 MG) BY MOUTH TWICE DAILY, Disp: 30 tablet, Rfl: 2  Allergies  Allergen Reactions    Aspirin     Upsets her stomach taking daily   Pravachol [Pravastatin Sodium]     Leg aches   Penicillins Rash   Review of Systems  Constitutional:  Negative for chills, fever, malaise/fatigue and weight loss.  HENT:  Negative for congestion, ear pain, hearing loss, sore throat and tinnitus.   Eyes:  Negative for blurred vision, pain and redness.  Respiratory:  Negative for cough, hemoptysis and shortness of breath.   Cardiovascular:  Negative for chest pain, palpitations, orthopnea, claudication and leg swelling.  Gastrointestinal:  Negative for abdominal pain, blood in stool, constipation, diarrhea, nausea and vomiting.  Genitourinary:  Negative for dysuria, flank pain, frequency, hematuria and urgency.  Musculoskeletal:  Positive for joint pain and neck pain. Negative for falls and myalgias.  Skin:  Negative for itching and rash.  Neurological:  Negative for dizziness, tingling, speech change, weakness and headaches.  Endo/Heme/Allergies:  Negative for polydipsia. Does not bruise/bleed easily.  Psychiatric/Behavioral:  Positive for memory loss. Negative for depression. The patient is not nervous/anxious and does not have insomnia.      Objective:  BP 110/64   Pulse 84   Ht 5\' 4"  (1.626 m)   Wt 168 lb 9.6 oz (76.5 kg)   BMI 28.94 kg/m   BP Readings from Last 3 Encounters:  03/03/23 110/64  06/13/22 132/84  04/18/22 137/85   Wt Readings from Last 3 Encounters:  03/03/23 168 lb 9.6 oz (76.5 kg)  06/13/22 164 lb 12.8 oz (74.8 kg)  01/21/22 159 lb (72.1 kg)     General appearance: alert, no distress, WD/WN, African American female Skin: right lateral upper face just lateral to eyelid with raised round brown 6mm diameter papular lesion unchanged for years. HEENT: normocephalic, conjunctiva/corneas normal, sclerae anicteric, PERRLA, EOMi, nares patent, no discharge or erythema, pharynx normal Oral cavity: MMM, tongue normal, teeth normal Neck: supple, no lymphadenopathy, no  thyromegaly, no masses, normal ROM, no bruits Chest: non tender, normal shape and expansion Heart: RRR, normal S1, S2, no murmurs Lungs: CTA bilaterally, no wheezes, rhonchi, or rales Abdomen: +bs, soft, non tender, non distended, no masses, no hepatomegaly, no splenomegaly, no bruits Back: non tender, normal ROM, no scoliosis Musculoskeletal: upper extremities non tender, no obvious deformity, normal ROM throughout, lower extremities non tender, no obvious deformity, normal ROM throughout Extremities: no edema, no cyanosis, no clubbing Pulses: 2+ symmetric, upper and lower extremities, normal cap refill Neurological: alert, oriented x 3, CN2-12 intact, strength normal upper extremities and lower extremities, sensation normal throughout, DTRs 2+ throughout, no cerebellar signs, gait normal Psychiatric: normal affect, behavior normal, pleasant  Breast/gyn/rectal -  deferred at her request today    Assessment and Plan :   Encounter Diagnoses  Name Primary?   Encounter for health maintenance examination in adult Yes   Vitamin D deficiency    Hyperlipidemia, unspecified hyperlipidemia type    Osteoporosis, unspecified osteoporosis type, unspecified pathological fracture presence    Memory change    Encounter for screening mammogram for malignant neoplasm of breast    Statin intolerance    Neck pain    Arthralgia, unspecified joint     Today you had a preventative care visit or wellness visit.    Topics today may have included healthy lifestyle, diet, exercise, preventative care, vaccinations, sick and well care, proper use of emergency dept and after hours care, as well as other concerns.     Recommendations: Continue to return yearly for your annual wellness and preventative care visits.  This gives [place] a chance to discuss healthy lifestyle, exercise, vaccinations, review your chart record, and perform screenings where appropriate.  I recommend you see your eye doctor yearly for  routine vision care.  I recommend you see your dentist yearly for routine dental care including hygiene visits twice yearly.   Vaccination recommendations were reviewed Immunization History  Administered Date(s) Administered   Influenza,inj,Quad PF,6+ Mos 08/15/2016, 05/13/2017, 05/11/2018, 04/17/2019, 04/17/2019, 06/05/2021, 06/11/2022   Influenza-Unspecified 06/03/2020   PFIZER(Purple Top)SARS-COV-2 Vaccination 11/06/2019, 02/12/2020, 05/15/2020   Pfizer Covid-19 Vaccine Bivalent Booster 80yrs & up 08/15/2021   Pneumococcal Polysaccharide-23 06/23/2012   Td 06/13/2022   Tdap 08/20/2003, 06/29/2013   Zoster Recombinant(Shingrix) 08/28/2017, 08/21/2018    Advised pneumococcal 20 Prevnar 20 vaccine later this year when she turns 67.  Screening for cancer: Breast cancer screening: You should perform a self breast exam monthly.   We reviewed recommendations for regular mammograms and breast cancer screening.  Colon cancer screening:  Reviewed 11/2019 colonoscopy with Dr. Christella Hartigan, polyps included Tubular Adenoma, repeat 5 years   You have had a hysterectomy and no longer need pap smear test.    You should have a breast and pelvic exams periodically.  You are due for this  Skin cancer screening: Check your skin regularly for new changes, growing lesions, or other lesions of concern Come in for evaluation if you have skin lesions of concern.  Lung cancer screening: If you have a greater than 30 pack year history of tobacco use, then you qualify for lung cancer screening with a chest CT scan  We currently don't have screenings for other cancers besides breast, cervical, colon, and lung cancers.  If you have a strong family history of cancer or have other cancer screening concerns, please let me know.    Bone health: Get at least 150 minutes of aerobic exercise weekly Get weight bearing exercise at least once weekly Osteopenia with improvement in bone density on 08/2019 exam.  Plan to  repeat bone density test 08/2021.    Fosamax started 07/2017.  Continue current medication and recheck bone density 08/2021.   Please call to schedule your bone density test.   [facility]  [phone] [address] [place], [place] [address]   Heart health: Get at least 150 minutes of aerobic exercise weekly Limit alcohol It is important to maintain a healthy blood pressure and healthy cholesterol numbers    Advanced Directives: I recommend you consider completing a Health Care Power of Attorney and Living Will.   These documents respect your wishes and help alleviate burdens on your loved  ones if you were to become terminally ill or be in a position to need those documents enforced.    You can complete Advanced Directives yourself, have them notarized, then have copies made for our office, for you and for anybody you feel should have them in safe keeping.  Or, you can have an attorney prepare these documents.   If you haven't updated your Last Will and Testament in a while, it may be worthwhile having an attorney prepare these documents together and save on some costs.       Separate significant issues discussed: Memory change, prior atrophy and small vessel ischemic change on MRI brain 2022.  On statin currently.  MMSE 24/29 today.  Labs today, consider neurology consult.  Consider carotid ultrasound.  Arthralgias/joint aches in general without obvious abnormality on exam.  Labs today.  Consider other evaluation  Osteoporosis-compliant with Fosamax.  I reviewed her 2023 bone density test.  Continue with weightbearing and aerobic exercise.  Hypertension-blood pressure is at goal today, continue current medication  Hyperlipidemia-fasting labs today, continue statin  Vitamin D deficiency-updated labs today, continue supplement 2000 units daily    Josiah was seen today for annual exam.  Diagnoses and all orders for this  visit:  Encounter for health maintenance examination in adult -     Comprehensive metabolic panel -     CBC -     VITAMIN D 25 Hydroxy (Vit-D Deficiency, Fractures) -     Lipid panel -     POCT Urinalysis DIP (Proadvantage Device) -     RPR  Vitamin D deficiency -     VITAMIN D 25 Hydroxy (Vit-D Deficiency, Fractures)  Hyperlipidemia, unspecified hyperlipidemia type -     Lipid panel  Osteoporosis, unspecified osteoporosis type, unspecified pathological fracture presence -     VITAMIN D 25 Hydroxy (Vit-D Deficiency, Fractures)  Memory change -     RPR  Encounter for screening mammogram for malignant neoplasm of breast -     MM 3D SCREENING MAMMOGRAM BILATERAL BREAST  Statin intolerance  Neck pain  Arthralgia, unspecified joint   Follow-up pending labs, yearly for physical

## 2023-03-04 ENCOUNTER — Other Ambulatory Visit: Payer: Self-pay | Admitting: Medical

## 2023-03-04 DIAGNOSIS — R413 Other amnesia: Secondary | ICD-10-CM

## 2023-03-04 LAB — COMPREHENSIVE METABOLIC PANEL
ALT: 10 IU/L (ref 0–32)
AST: 13 IU/L (ref 0–40)
Albumin: 4.4 g/dL (ref 3.9–4.9)
Alkaline Phosphatase: 70 IU/L (ref 44–121)
BUN/Creatinine Ratio: 20 (ref 12–28)
BUN: 11 mg/dL (ref 8–27)
Bilirubin Total: 0.2 mg/dL (ref 0.0–1.2)
CO2: 25 mmol/L (ref 20–29)
Calcium: 9.2 mg/dL (ref 8.7–10.3)
Chloride: 102 mmol/L (ref 96–106)
Creatinine, Ser: 0.55 mg/dL — ABNORMAL LOW (ref 0.57–1.00)
Globulin, Total: 2.5 g/dL (ref 1.5–4.5)
Glucose: 89 mg/dL (ref 70–99)
Potassium: 4.6 mmol/L (ref 3.5–5.2)
Sodium: 142 mmol/L (ref 134–144)
Total Protein: 6.9 g/dL (ref 6.0–8.5)
eGFR: 102 mL/min/{1.73_m2} (ref >59)

## 2023-03-04 LAB — LIPID PANEL
Chol/HDL Ratio: 3 ratio (ref 0.0–4.4)
Cholesterol, Total: 140 mg/dL (ref 100–199)
HDL: 46 mg/dL (ref >39)
LDL Chol Calc (NIH): 75 mg/dL (ref 0–99)
Triglycerides: 101 mg/dL (ref 0–149)
VLDL Cholesterol Cal: 19 mg/dL (ref 5–40)

## 2023-03-04 LAB — CBC
Hematocrit: 39.5 % (ref 34.0–46.6)
Hemoglobin: 12.8 g/dL (ref 11.1–15.9)
MCH: 28.4 pg (ref 26.6–33.0)
MCHC: 32.4 g/dL (ref 31.5–35.7)
MCV: 88 fL (ref 79–97)
Platelets: 279 10*3/uL (ref 150–450)
RBC: 4.5 x10E6/uL (ref 3.77–5.28)
RDW: 13 % (ref 11.7–15.4)
WBC: 7.8 10*3/uL (ref 3.4–10.8)

## 2023-03-04 LAB — VITAMIN D 25 HYDROXY (VIT D DEFICIENCY, FRACTURES): Vit D, 25-Hydroxy: 37.6 ng/mL (ref 30.0–100.0)

## 2023-03-04 LAB — RPR: RPR Ser Ql: NONREACTIVE

## 2023-03-04 MED ORDER — ROSUVASTATIN CALCIUM 10 MG PO TABS: 10.0000 mg | ORAL_TABLET | Freq: Every day | ORAL | 3 refills | Status: DC

## 2023-03-04 MED ORDER — OLMESARTAN MEDOXOMIL-HCTZ 40-12.5 MG PO TABS: 1.0000 | ORAL_TABLET | Freq: Every day | ORAL | 3 refills | Status: DC

## 2023-03-04 MED ORDER — VALACYCLOVIR HCL 1 G PO TABS: ORAL_TABLET | ORAL | 2 refills | Status: DC

## 2023-03-04 MED ORDER — FLUTICASONE PROPIONATE 50 MCG/ACT NA SUSP: 1.0000 | Freq: Every day | NASAL | 3 refills | Status: DC

## 2023-03-04 MED ORDER — FLUOXETINE HCL 10 MG PO CAPS: ORAL_CAPSULE | ORAL | 3 refills | Status: DC

## 2023-03-04 MED ORDER — VITAMIN D 50 MCG (2000 UT) PO CAPS: 1.0000 | ORAL_CAPSULE | Freq: Every day | ORAL | 3 refills | Status: DC

## 2023-03-04 NOTE — Progress Notes (Signed)
Results sent through My Chart  Schedule 4 to 56-month med check follow-up

## 2023-03-14 ENCOUNTER — Encounter: Payer: Self-pay | Admitting: Physician Assistant

## 2023-03-14 ENCOUNTER — Ambulatory Visit: Payer: BC Managed Care – PPO | Admitting: Physician Assistant

## 2023-03-14 DIAGNOSIS — Z029 Encounter for administrative examinations, unspecified: Secondary | ICD-10-CM

## 2023-03-28 ENCOUNTER — Encounter: Payer: Self-pay | Admitting: Physician Assistant

## 2023-03-28 ENCOUNTER — Ambulatory Visit: Payer: BC Managed Care – PPO

## 2023-03-28 ENCOUNTER — Ambulatory Visit: Payer: BC Managed Care – PPO | Admitting: Physician Assistant

## 2023-03-28 VITALS — BP 122/80 | HR 92 | Resp 18 | Ht 63.5 in | Wt 165.0 lb

## 2023-03-28 DIAGNOSIS — R413 Other amnesia: Secondary | ICD-10-CM | POA: Diagnosis not present
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
## 2023-03-28 NOTE — Patient Instructions (Signed)
It was a pleasure to see you today at our office.   Recommendations:   Continue to monitor mood with Behavioral Medicine Mediterranean diet is recommended  Repeat neuropsych evaluation for clarity of diagnosis  6 month  RECOMMENDATIONS FOR ALL PATIENTS WITH MEMORY PROBLEMS: 1. Continue to exercise (Recommend 30 minutes of walking everyday, or 3 hours every week) 2. Increase social interactions - continue going to [place] and enjoy social gatherings with friends and family 3. Eat healthy, avoid fried foods and eat more fruits and vegetables 4. Maintain adequate blood pressure, blood sugar, and blood cholesterol level. Reducing the risk of stroke and cardiovascular disease also helps promoting better memory. 5. Avoid stressful situations. Live a simple life and avoid aggravations. Organize your time and prepare for the next day in anticipation. 6. Sleep well, avoid any interruptions of sleep and avoid any distractions in the bedroom that may interfere with adequate sleep quality 7. Avoid sugar, avoid sweets as there is a strong link between excessive sugar intake, diabetes, and cognitive impairment We discussed the Mediterranean diet, which has been shown to help patients reduce the risk of progressive memory disorders and reduces cardiovascular risk. This includes eating fish, eat fruits and green leafy vegetables, nuts like almonds and hazelnuts, walnuts, and also use olive oil. Avoid fast foods and fried foods as much as possible. Avoid sweets and sugar as sugar use has been linked to worsening of memory function.  There is always a concern of gradual progression of memory problems. If this is the case, then we may need to adjust level of care according to patient needs. Support, both to the patient and caregiver, should then be put into place.       Mediterranean Diet A Mediterranean diet refers to food and lifestyle choices that are based on the traditions of countries located on the  [employer]. This way of eating has been shown to help prevent certain conditions and improve outcomes for people who have chronic diseases, like kidney disease and heart disease. What are tips for following this plan? Lifestyle  Cook and eat meals together with your family, when possible. Drink enough fluid to keep your urine clear or pale yellow. Be physically active every day. This includes: Aerobic exercise like running or swimming. Leisure activities like gardening, walking, or housework. Get 7-8 hours of sleep each night. If recommended by your health care provider, drink red wine in moderation. This means 1 glass a day for nonpregnant women and 2 glasses a day for men. A glass of wine equals 5 oz (150 mL). Reading food labels  Check the serving size of packaged foods. For foods such as rice and pasta, the serving size refers to the amount of cooked product, not dry. Check the total fat in packaged foods. Avoid foods that have saturated fat or trans fats. Check the ingredients list for added sugars, such as corn syrup. Shopping  At the grocery store, buy most of your food from the areas near the walls of the store. This includes: Fresh fruits and vegetables (produce). Grains, beans, nuts, and seeds. Some of these may be available in unpackaged forms or large amounts (in bulk). Fresh seafood. Poultry and eggs. Low-fat dairy products. Buy whole ingredients instead of prepackaged foods. Buy fresh fruits and vegetables in-season from local farmers markets. Buy frozen fruits and vegetables in resealable bags. If you do not have access to quality fresh seafood, buy precooked frozen shrimp or canned fish, such as tuna, salmon, or sardines.  Buy small amounts of raw or cooked vegetables, salads, or olives from the deli or salad bar at your store. Stock your pantry so you always have certain foods on hand, such as olive oil, canned tuna, canned tomatoes, rice, pasta, and beans. Cooking   Cook foods with extra-virgin olive oil instead of using butter or other vegetable oils. Have meat as a side dish, and have vegetables or grains as your main dish. This means having meat in small portions or adding small amounts of meat to foods like pasta or stew. Use beans or vegetables instead of meat in common dishes like chili or lasagna. Experiment with different cooking methods. Try roasting or broiling vegetables instead of steaming or sauteing them. Add frozen vegetables to soups, stews, pasta, or rice. Add nuts or seeds for added healthy fat at each meal. You can add these to yogurt, salads, or vegetable dishes. Marinate fish or vegetables using olive oil, lemon juice, garlic, and fresh herbs. Meal planning  Plan to eat 1 vegetarian meal one day each week. Try to work up to 2 vegetarian meals, if possible. Eat seafood 2 or more times a week. Have healthy snacks readily available, such as: Vegetable sticks with hummus. Greek yogurt. Fruit and nut trail mix. Eat balanced meals throughout the week. This includes: Fruit: 2-3 servings a day Vegetables: 4-5 servings a day Low-fat dairy: 2 servings a day Fish, poultry, or lean meat: 1 serving a day Beans and legumes: 2 or more servings a week Nuts and seeds: 1-2 servings a day Whole grains: 6-8 servings a day Extra-virgin olive oil: 3-4 servings a day Limit red meat and sweets to only a few servings a month What are my food choices? Mediterranean diet Recommended Grains: Whole-grain pasta. Brown rice. Bulgar wheat. Polenta. Couscous. Whole-wheat bread. Orpah Cobb. Vegetables: Artichokes. Beets. Broccoli. Cabbage. Carrots. Eggplant. Green beans. Chard. Kale. Spinach. Onions. Leeks. Peas. Squash. Tomatoes. Peppers. Radishes. Fruits: Apples. Apricots. Avocado. Berries. Bananas. Cherries. Dates. Figs. Grapes. Lemons. Melon. Oranges. Peaches. Plums. Pomegranate. Meats and other protein foods: Beans. Almonds. Sunflower seeds. Pine  nuts. Peanuts. Cod. Salmon. Scallops. Shrimp. Tuna. Tilapia. Clams. Oysters. Eggs. Dairy: Low-fat milk. Cheese. Greek yogurt. Beverages: Water. Red wine. Herbal tea. Fats and oils: Extra virgin olive oil. Avocado oil. Grape seed oil. Sweets and desserts: [place] yogurt with honey. Baked apples. Poached pears. Trail mix. Seasoning and other foods: Basil. Cilantro. Coriander. Cumin. Mint. Parsley. Sage. Rosemary. Tarragon. Garlic. Oregano. Thyme. Pepper. Balsalmic vinegar. Tahini. Hummus. Tomato sauce. Olives. Mushrooms. Limit these Grains: Prepackaged pasta or rice dishes. Prepackaged cereal with added sugar. Vegetables: Deep fried potatoes (french fries). Fruits: Fruit canned in syrup. Meats and other protein foods: Beef. Pork. Lamb. Poultry with skin. Hot dogs. Tomasa Blase. Dairy: Ice cream. Sour cream. Whole milk. Beverages: Juice. Sugar-sweetened soft drinks. Beer. Liquor and spirits. Fats and oils: Butter. Canola oil. Vegetable oil. Beef fat (tallow). Lard. Sweets and desserts: Cookies. Cakes. Pies. Candy. Seasoning and other foods: Mayonnaise. Premade sauces and marinades. The items listed may not be a complete list. Talk with your dietitian about what dietary choices are right for you. Summary The Mediterranean diet includes both food and lifestyle choices. Eat a variety of fresh fruits and vegetables, beans, nuts, seeds, and whole grains. Limit the amount of red meat and sweets that you eat. Talk with your health care provider about whether it is safe for you to drink red wine in moderation. This means 1 glass a day for nonpregnant women and 2 glasses a  day for men. A glass of wine equals 5 oz (150 mL). This information is not intended to replace advice given to you by your health care provider. Make sure you discuss any questions you have with your health care provider. Document Released: 03/28/2016 Document Revised: 04/30/2016 Document Reviewed: 03/28/2016 Elsevier Interactive Patient  Education  2017 [employer].

## 2023-04-04 ENCOUNTER — Encounter: Payer: BC Managed Care – PPO | Admitting: Medical

## 2023-04-08 LAB — HM MAMMOGRAPHY

## 2023-04-11 ENCOUNTER — Encounter: Payer: Self-pay | Admitting: Internal Medicine

## 2023-05-07 ENCOUNTER — Other Ambulatory Visit: Payer: BC Managed Care – PPO

## 2023-06-02 ENCOUNTER — Telehealth: Payer: Self-pay | Admitting: Medical

## 2023-06-02 NOTE — Telephone Encounter (Signed)
Pt called and asks if you can write her a letter explaining to her part time job that she can't do steps or lift heavy chairs due to her hernia. She says she will schedule an appointment if needed but with her two jobs it is hard to find a good time.

## 2023-06-06 ENCOUNTER — Ambulatory Visit: Payer: BC Managed Care – PPO | Admitting: Medical

## 2023-06-06 ENCOUNTER — Encounter: Payer: Self-pay | Admitting: Medical

## 2023-06-06 VITALS — BP 120/80 | HR 89 | Wt 165.0 lb

## 2023-06-06 DIAGNOSIS — Z7185 Encounter for immunization safety counseling: Secondary | ICD-10-CM | POA: Diagnosis not present

## 2023-06-06 DIAGNOSIS — R109 Unspecified abdominal pain: Secondary | ICD-10-CM | POA: Diagnosis not present

## 2023-06-06 DIAGNOSIS — Z23 Encounter for immunization: Secondary | ICD-10-CM | POA: Diagnosis not present

## 2023-06-06 NOTE — Progress Notes (Signed)
Subjective:  Holly [name] is a 67 y.o. female who presents for Chief Complaint  Patient presents with   Consult    Discuss hernia. 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 Has an appointment November 4th with general surgery     Here for c/o hernia.   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  She has recently called general surgery to schedule consult for 06/23/23.    Our office referred 01/2022 to general surgery due to findings last year on CT scan.  She works at [employer] and trying to [employer] or going up and down stairs for hours and hours aggravates her pain.  No change in stool or bowels, no fever, body aches or chills.  She would like a flu shot today.  No other aggravating or relieving factors.    No other c/o.  The following portions of the patient's history were reviewed and updated as appropriate: allergies, current medications, past family history, past medical history, past social history, past surgical history and problem list.  ROS Otherwise as in subjective above    Objective: BP 120/80   Pulse 89   Wt 165 lb (74.8 kg)   BMI 28.77 kg/m   General appearance: alert, no distress, well developed, well nourished Abdomen: +bs, soft, diastases recti noted and tender in the central abdomen supra umbilical area, otherwise non tender, non distended, no masses, no hepatomegaly, no splenomegaly Pulses: 2+ radial pulses, 2+ pedal pulses, normal cap refill Ext: no edema   CT abdomen 02/15/22: IMPRESSION: 1. 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 This is likely the etiology of the patient's reported hernia pain. 2. Additionally there is a small fat-containing umbilical hernia.    Assessment: Encounter Diagnoses  Name Primary?   Abdominal pain, unspecified abdominal  location Yes   Need for influenza vaccination    Vaccine counseling      Plan: Abdominal pain-we reviewed her CT scan from last year in June 2023.  Follow-up with surgeon as planned.  Work restriction note given today.  In general advise she go to the gym regularly and do weightbearing and aerobic exercise along with stretching daily.  Counseled on the influenza virus vaccine.  Vaccine information sheet given.  Influenza vaccine given after consent obtained.  Consider pneumococcal 20 and COVID-vaccine  Holly [name] was seen today for consult.  Diagnoses and all orders for this visit:  Abdominal pain, unspecified abdominal location  Need for influenza vaccination  Vaccine counseling   Follow up: With surgery consult

## 2023-06-06 NOTE — Addendum Note (Signed)
Addended by: Herminio Commons A on: 06/06/2023 09:12 AM   Modules accepted: Orders

## 2023-06-24 ENCOUNTER — Other Ambulatory Visit (INDEPENDENT_AMBULATORY_CARE_PROVIDER_SITE_OTHER): Payer: BC Managed Care – PPO

## 2023-06-24 DIAGNOSIS — Z23 Encounter for immunization: Secondary | ICD-10-CM

## 2023-07-01 ENCOUNTER — Encounter: Payer: Self-pay | Admitting: Medical

## 2023-07-01 ENCOUNTER — Ambulatory Visit (INDEPENDENT_AMBULATORY_CARE_PROVIDER_SITE_OTHER): Payer: BC Managed Care – PPO | Admitting: Medical

## 2023-07-01 VITALS — BP 134/78 | HR 80 | Ht 63.5 in | Wt 167.0 lb

## 2023-07-01 DIAGNOSIS — R252 Cramp and spasm: Secondary | ICD-10-CM | POA: Diagnosis not present

## 2023-07-01 DIAGNOSIS — M79604 Pain in right leg: Secondary | ICD-10-CM | POA: Diagnosis not present

## 2023-07-01 DIAGNOSIS — M79605 Pain in left leg: Secondary | ICD-10-CM | POA: Diagnosis not present

## 2023-07-01 DIAGNOSIS — R0989 Other specified symptoms and signs involving the circulatory and respiratory systems: Secondary | ICD-10-CM

## 2023-07-01 NOTE — Progress Notes (Signed)
Subjective:  Holly [name] is a 67 y.o. female who presents for Chief Complaint  Patient presents with   Leg cramping    Has been having off and on leg cramping. 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     Here for leg cramping.  For several months she gets cramps in her calves.  She does work as a Midwife and works a second job at [employer] so she is on her feet a lot of using her legs a lot.  She feels like she is drinking a good amount of water daily.  She is a former smoker, has not smoked in several years.  She denies pains in her thighs or weakness.  Just mainly cramps in the legs.  The cramps can be anytime of day and at night.  She is compliant with her medicines including olmesartan HCT and Crestor  She had this problem a few years ago and tried mustard daily and tonic water and did not particular like either  No other aggravating or relieving factors.    No other c/o.  Past Medical History:  Diagnosis Date   Chronic constipation    enrolled in a constipation study with [facility] 01/2013, on Plecanatide 6mg  daily   Diverticulosis 01/2013   on colonoscopy   Former smoker 2018   H/O echocardiogram 03/2014   normal, Dr. 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 gets once to twice yearly   Hyperlipidemia    Hypertension    controlled off medication 2019   Menopausal disorder    Microscopic hematuria 04/2013   urology eval, no worrisome causes.  recheck only with frank hematuria.   Normal cardiac stress test 03/23/14   Dr. Viann Fish   S/P hysterectomy    due to fibroids   Sleep difficulties    due to menopause   SOB (shortness of breath)    cardiac and PFT eval 03/2014   Vitamin D deficiency    Wears glasses    Current Outpatient Medications on File Prior to Visit  Medication Sig Dispense Refill   alendronate (FOSAMAX) 70 MG tablet TAKE 1 TABLET BY MOUTH EVERY WEEK WITH A FULL GLASS OF WATER AND ON AN  EMPTY STOMACH 12 tablet 1   calcium carbonate (OS-CAL) 600 MG TABS tablet Take 600 mg by mouth daily with breakfast.      Cholecalciferol (VITAMIN D) 50 MCG (2000 UT) CAPS Take 1 capsule (2,000 Units total) by mouth daily. 90 capsule 3   FLUoxetine (PROZAC) 10 MG capsule TAKE 1 CAPSULE(10 MG) BY MOUTH DAILY 90 capsule 3   fluticasone (FLONASE) 50 MCG/ACT nasal spray Place 1 spray into both nostrils daily. 48 g 3   loratadine (CLARITIN) 10 MG tablet Take 10 mg by mouth daily.      olmesartan-hydrochlorothiazide (BENICAR HCT) 40-12.5 MG tablet Take 1 tablet by mouth daily. 90 tablet 3   rosuvastatin (CRESTOR) 10 MG tablet Take 1 tablet (10 mg total) by mouth daily. 90 tablet 3   acetaminophen (TYLENOL) 500 MG tablet Take 1,000 mg by mouth every 6 (six) hours as needed. (Patient not taking: Reported on 07/01/2023)     valACYclovir (VALTREX) 1000 MG tablet 2 tablets BID x 2 days for flare up (Patient not taking: Reported on 07/01/2023) 30 tablet 2   No current facility-administered medications on file prior to visit.     The following portions of the  patient's history were reviewed and updated as appropriate: allergies, current medications, past family history, past medical history, past social history, past surgical history and problem list.  ROS Otherwise as in subjective above  Objective: BP 134/78   Pulse 80   Ht 5' 3.5" (1.613 m)   Wt 167 lb (75.8 kg)   BMI 29.12 kg/m   General appearance: alert, no distress, well developed, well nourished 1+ pedal pulses, cap refill seems slightly delayed on both feet Legs nontender and no obvious deformity, normal range of motion Ext: no edema Legs normal strength and sensation    Assessment: Encounter Diagnoses  Name Primary?   Leg cramp Yes   Pain in both lower extremities    Decreased pedal pulses      Plan: We discussed her concerns and possible evaluation.  Labs today.  We will update ABI.  She had a normal ABI in 2018.  We  discussed that some of her medicines could potentially play a role such as statin and hydrochlorothiazide.  Labs as below.  She also is on her feet quite a bit so relative rest will be helpful.  Compression hose is helpful.  Stretching is helpful.  [name] was seen today for leg cramping.  Diagnoses and all orders for this visit:  Leg cramp -     Magnesium -     Comprehensive metabolic panel -     VAS [place] ABI WITH/WO TBI; Future  Pain in both lower extremities -     Magnesium -     Comprehensive metabolic panel -     VAS [place] ABI WITH/WO TBI; Future  Decreased pedal pulses -     VAS [place] ABI WITH/WO TBI; Future    Follow up: pending labs and test

## 2023-07-02 ENCOUNTER — Ambulatory Visit (HOSPITAL_COMMUNITY)
Admission: RE | Admit: 2023-07-02 | Discharge: 2023-07-02 | Disposition: A | Discharge status: Home / Self Care | Payer: BC Managed Care – PPO | Source: Ambulatory Visit | Attending: Cardiology | Admitting: Cardiology

## 2023-07-02 DIAGNOSIS — M79605 Pain in left leg: Secondary | ICD-10-CM | POA: Insufficient documentation

## 2023-07-02 DIAGNOSIS — M79604 Pain in right leg: Secondary | ICD-10-CM

## 2023-07-02 DIAGNOSIS — R0989 Other specified symptoms and signs involving the circulatory and respiratory systems: Secondary | ICD-10-CM | POA: Diagnosis present

## 2023-07-02 DIAGNOSIS — R252 Cramp and spasm: Secondary | ICD-10-CM

## 2023-07-02 LAB — COMPREHENSIVE METABOLIC PANEL
ALT: 6 [IU]/L (ref 0–32)
AST: 15 [IU]/L (ref 0–40)
Albumin: 4.3 g/dL (ref 3.9–4.9)
Alkaline Phosphatase: 74 [IU]/L (ref 44–121)
BUN/Creatinine Ratio: 15 (ref 12–28)
BUN: 9 mg/dL (ref 8–27)
Bilirubin Total: 0.2 mg/dL (ref 0.0–1.2)
CO2: 25 mmol/L (ref 20–29)
Calcium: 8.9 mg/dL (ref 8.7–10.3)
Chloride: 107 mmol/L — ABNORMAL HIGH (ref 96–106)
Creatinine, Ser: 0.62 mg/dL (ref 0.57–1.00)
Globulin, Total: 2.6 g/dL (ref 1.5–4.5)
Glucose: 90 mg/dL (ref 70–99)
Potassium: 4.6 mmol/L (ref 3.5–5.2)
Sodium: 144 mmol/L (ref 134–144)
Total Protein: 6.9 g/dL (ref 6.0–8.5)
eGFR: 99 mL/min/{1.73_m2} (ref >59)

## 2023-07-02 LAB — MAGNESIUM: Magnesium: 2.4 mg/dL — ABNORMAL HIGH (ref 1.6–2.3)

## 2023-07-02 LAB — VAS US ABI WITH/WO TBI
Left ABI: 1.18
Right ABI: 1.26

## 2023-07-02 NOTE — Progress Notes (Signed)
Magnesium electrolytes are okay.  Nothing in the labs to suggest cramps  Continue plan for ABI blood flow screen.  Expect a phone call about scheduling  In the meantime consider flavored tonic water as we discussed or with splint of mustard daily  I want you to stop your Crestor for 2 weeks to see if that makes any difference as well

## 2023-07-02 NOTE — Progress Notes (Signed)
Leg blood flow screening thankfully is normal  As mentioned with the lab reply, consider doing some tonic water that is flavored 1 cup twice a day or tsp of mustard daily by mouth  Stop Crestor for 2 weeks and see if that helps as well  Let me know within 2 weeks how you are doing

## 2023-08-22 ENCOUNTER — Other Ambulatory Visit: Payer: Self-pay | Admitting: Medical

## 2023-09-29 ENCOUNTER — Encounter: Payer: Self-pay | Admitting: Physician Assistant

## 2023-10-06 ENCOUNTER — Ambulatory Visit: Payer: BC Managed Care – PPO | Admitting: Physician Assistant

## 2023-10-13 ENCOUNTER — Ambulatory Visit: Payer: BC Managed Care – PPO | Admitting: Physician Assistant

## 2023-12-30 ENCOUNTER — Ambulatory Visit: Payer: Self-pay | Admitting: Medical

## 2024-01-27 ENCOUNTER — Ambulatory Visit: Payer: Self-pay

## 2024-01-27 NOTE — Telephone Encounter (Signed)
 FYI Only or Action Required?: FYI only for provider  Patient was last seen in primary care on 07/01/2023 by Claudene Crystal, PA-C. Called Nurse Triage reporting Cough. Symptoms began several days ago. Interventions attempted: OTC medications: See note . Symptoms are: stable.  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 SYMPTOMS: "What symptoms are you having?" (e.g., none, cough, difficulty breathing, headache, dizziness, nausea)     ---- Congested    2. ONSET:  "When did the symptoms begin?"     -----------Yesterday      3. SOURCE OF EXPOSURE: "What happened?" (e.g., home fire, second-hand smoke)     ------------ Cleaning the oven. After cleaning the oven she turned it on. The oven produced a smoke and she accidentally inhaled a bit.    4. CO EXPOSURE:  "Were you exposed to carbon monoxide?"    - KNOWN  - high CO measured in air; coworker or family member diagnosed with CO poisoning.     - SUSPECTED - CO alarm sounded; exposure to car exhaust fumes, burning charcoal, burning kerosene, or other gas burning device.       ------ Denies    5. OTHERS: "Is anyone else having similar symptoms?"     -------------- Denies     6. TREATMENT: "What have you done so far  to treat this?" (e.g., open the windows, go outside)     ------------- Claritin, Flonase , and  OTC cough syrup. -- Minimal relief      Additional Information  --- Patient has an intermittent cough.  ---- Phelgm: Clear  Protocols used: Smoke and Fume Inhalation-A-AH

## 2024-01-27 NOTE — Telephone Encounter (Signed)
 2nd attempt - called patient, no answer. LVM.

## 2024-01-27 NOTE — Telephone Encounter (Signed)
 Copied from CRM [id number]. Topic: Clinical - Medical Advice >> Jan 27, 2024 12:24 PM Santiya F wrote: Reason for CRM: Patient is calling in because she says she was cleaning the oven and believes she inhaled the fumes and says it has caused some cough and congestion. Patient wanted to see her provider this week in the morning, but no appointments were available. Patient wants to know if she can get some recommendations on what she can take over the counter to help with the cough and congestion. Patient says it's not COVID, she knows it came from cleaning the oven as she has allergies that are easily irritated. Please advise.

## 2024-01-27 NOTE — Telephone Encounter (Signed)
 This RN attempted 1st attempt to triage patient. No answer. LVM. Will place in call backs.

## 2024-01-28 ENCOUNTER — Telehealth: Payer: Self-pay | Admitting: Physician Assistant

## 2024-01-28 ENCOUNTER — Telehealth: Payer: Self-pay

## 2024-01-28 DIAGNOSIS — R051 Acute cough: Secondary | ICD-10-CM

## 2024-01-28 DIAGNOSIS — B9689 Other specified bacterial agents as the cause of diseases classified elsewhere: Secondary | ICD-10-CM

## 2024-01-28 DIAGNOSIS — J019 Acute sinusitis, unspecified: Secondary | ICD-10-CM

## 2024-01-28 MED ORDER — BENZONATATE 100 MG PO CAPS: 100.0000 mg | ORAL_CAPSULE | Freq: Three times a day (TID) | ORAL | 0 refills | Status: DC | PRN

## 2024-01-28 MED ORDER — DOXYCYCLINE HYCLATE 100 MG PO TABS: 100.0000 mg | ORAL_TABLET | Freq: Two times a day (BID) | ORAL | 0 refills | Status: DC

## 2024-01-28 NOTE — Progress Notes (Signed)
 Virtual Visit Consent   Holly [name], you are scheduled for a virtual visit with a [facility] provider today. Just as with appointments in the office, your consent must be obtained to participate. Your consent will be active for this visit and any virtual visit you may have with one of our providers in the next 365 days. If you have a MyChart account, a copy of this consent can be sent to you electronically.  As this is a virtual visit, video technology does not allow for your provider to perform a traditional examination. This may limit your provider's ability to fully assess your condition. If your provider identifies any concerns that need to be evaluated in person or the need to arrange testing (such as labs, EKG, etc.), we will make arrangements to do so. Although advances in technology are sophisticated, we cannot ensure that it will always work on either your end or our end. If the connection with a video visit is poor, the visit may have to be switched to a telephone visit. With either a video or telephone visit, we are not always able to ensure that we have a secure connection.  By engaging in this virtual visit, you consent to the provision of healthcare and authorize for your insurance to be billed (if applicable) for the services provided during this visit. Depending on your insurance coverage, you may receive a charge related to this service.  I need to obtain your verbal consent now. Are you willing to proceed with your visit today? Anaisabel NAYRA COURY has provided verbal consent on 01/28/2024 for a virtual visit (video or telephone). Angelia Kelp, PA-C  Date: 01/28/2024 12:28 PM   Virtual Visit via Video Note   I, Angelia Kelp, connected with  Holly [name]  ([id number], [birth year]) on 01/28/24 at 12:15 PM EDT by a video-enabled telemedicine application and verified that I am speaking with the correct person using two identifiers.  Location: Patient: Virtual Visit  Location Patient: Home Provider: Virtual Visit Location Provider: Home Office   I discussed the limitations of evaluation and management by telemedicine and the availability of in person appointments. The patient expressed understanding and agreed to proceed.    History of Present Illness: Holly [name] is a 68 y.o. who identifies as a female who was assigned female at birth, and is being seen today for sough and sinus congestion.  HPI: Cough This is a new problem. The current episode started 1 to 4 weeks ago. The problem has been gradually worsening. The problem occurs constantly. The cough is Productive of sputum and productive of purulent sputum. Associated symptoms include headaches (sinus), nasal congestion (and sinus pressure), postnasal drip, rhinorrhea and a sore throat (started this morning). Pertinent negatives include no chest pain, chills, ear congestion, ear pain, fever, myalgias, shortness of breath or wheezing. The symptoms are aggravated by lying down. Treatments tried: Mucinex , takes Claritin and Flonase  daily through the  year. The treatment provided no relief. Her past medical history is significant for environmental allergies.     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   Posterior vitreous detachment of 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  Diverticulosis 10/18/2019   Family history of colon cancer 08/23/2019   S/P hysterectomy 08/21/2018   Screen for colon cancer 08/20/2017   Polyarthralgia 08/20/2017   High risk medication use 11/11/2016   Osteoporosis 10/02/2016   Routine general medical examination at a health care  facility 08/15/2016   Former smoker 08/15/2016   Estrogen deficiency 08/15/2016   Hyperlipidemia 06/23/2012   Vitamin D  deficiency 06/23/2012    Allergies:  Allergies  Allergen Reactions   Aspirin      Upsets her stomach taking daily   Pravachol  [Pravastatin  Sodium]     Leg aches   Penicillins Rash   Medications:  Current Outpatient Medications:    benzonatate  (TESSALON ) 100 MG capsule, Take 1-2 capsules (100-200 mg total) by mouth 3 (three) times daily as needed., Disp: 30 capsule, Rfl: 0   doxycycline (VIBRA-TABS) 100 MG tablet, Take 1 tablet (100 mg total) by mouth 2 (two) times daily., Disp: 20 tablet, Rfl: 0   acetaminophen  (TYLENOL ) 500 MG tablet, Take 1,000 mg by mouth every 6 (six) hours as needed. (Patient not taking: Reported on 07/01/2023), Disp: , Rfl:    alendronate  (FOSAMAX ) 70 MG tablet, TAKE 1 TABLET BY MOUTH EVERY WEEK WITH A FULL GLASS OF WATER AND ON AN EMPTY STOMACH, Disp: 12 tablet, Rfl: 1   calcium  carbonate (OS-CAL) 600 MG TABS tablet, Take 600 mg by mouth daily with breakfast. , Disp: , Rfl:    Cholecalciferol (VITAMIN D ) 50 MCG (2000 UT) CAPS, Take 1 capsule (2,000 Units total) by mouth daily., Disp: 90 capsule, Rfl: 3   FLUoxetine  (PROZAC ) 10 MG capsule, TAKE 1 CAPSULE(10 MG) BY MOUTH DAILY, Disp: 90 capsule, Rfl: 3   fluticasone  (FLONASE ) 50 MCG/ACT nasal spray, Place 1 spray into both nostrils daily., Disp: 48 g, Rfl: 3   loratadine (CLARITIN) 10 MG tablet, Take 10 mg by mouth daily. , Disp: , Rfl:    olmesartan -hydrochlorothiazide (BENICAR  HCT) 40-12.5 MG tablet, Take 1 tablet by mouth daily., Disp: 90 tablet, Rfl: 3   rosuvastatin  (CRESTOR ) 10 MG tablet, Take 1 tablet (10 mg total) by mouth daily., Disp: 90 tablet, Rfl: 3   valACYclovir  (VALTREX ) 1000 MG tablet, 2 tablets BID x 2 days for flare up (Patient not taking: Reported on 07/01/2023), Disp: 30 tablet, Rfl: 2  Observations/Objective: Patient is well-developed, well-nourished in no acute distress.   Resting comfortably at home.  Head is normocephalic, atraumatic.  No labored breathing.  Speech is clear and coherent with logical content.  Patient is alert and oriented at baseline.    Assessment and Plan: 1. Acute bacterial sinusitis (Primary) - doxycycline (VIBRA-TABS) 100 MG tablet; Take 1 tablet (100 mg total) by mouth 2 (two) times daily.  Dispense: 20 tablet; Refill: 0  2. Acute cough - benzonatate  (TESSALON ) 100 MG capsule; Take 1-2 capsules (100-200 mg total) by mouth 3 (three) times daily as needed.  Dispense: 30 capsule; Refill: 0  - Worsening symptoms that have not responded to OTC medications.  - Will give Doxycycline - Tessalon  for cough - Can continue Mucinex  - Continue allergy medications.  - Steam and humidifier can help - Stay well hydrated and get plenty of rest.  - Seek in person evaluation if no symptom improvement or if symptoms worsen   Follow Up Instructions: I discussed the assessment and treatment plan with the patient. The patient was provided an opportunity to ask questions and all were answered. The patient agreed with the plan and demonstrated an understanding of the instructions.  A  copy of instructions were sent to the patient via MyChart unless otherwise noted below.    The patient was advised to call back or seek an in-person evaluation if the symptoms worsen or if the condition fails to improve as anticipated.    Angelia Kelp, PA-C

## 2024-01-28 NOTE — Patient Instructions (Signed)
 Holly [name], thank you for joining Angelia Kelp, PA-C for today's virtual visit.  While this provider is not your primary care provider (PCP), if your PCP is located in our provider database this encounter information will be shared with them immediately following your visit.   A [facility] MyChart account gives you access to today's visit and all your visits, tests, and labs performed at [facility]  click here if you don't have a [facility] MyChart account or go to [email or web address]  Consent: (Patient) Holly [name] provided verbal consent for this virtual visit at the beginning of the encounter.  Current Medications:  Current Outpatient Medications:    benzonatate  (TESSALON ) 100 MG capsule, Take 1-2 capsules (100-200 mg total) by mouth 3 (three) times daily as needed., Disp: 30 capsule, Rfl: 0   doxycycline (VIBRA-TABS) 100 MG tablet, Take 1 tablet (100 mg total) by mouth 2 (two) times daily., Disp: 20 tablet, Rfl: 0   acetaminophen  (TYLENOL ) 500 MG tablet, Take 1,000 mg by mouth every 6 (six) hours as needed. (Patient not taking: Reported on 07/01/2023), Disp: , Rfl:    alendronate  (FOSAMAX ) 70 MG tablet, TAKE 1 TABLET BY MOUTH EVERY WEEK WITH A FULL GLASS OF WATER AND ON AN EMPTY STOMACH, Disp: 12 tablet, Rfl: 1   calcium  carbonate (OS-CAL) 600 MG TABS tablet, Take 600 mg by mouth daily with breakfast. , Disp: , Rfl:    Cholecalciferol (VITAMIN D ) 50 MCG (2000 UT) CAPS, Take 1 capsule (2,000 Units total) by mouth daily., Disp: 90 capsule, Rfl: 3   FLUoxetine  (PROZAC ) 10 MG capsule, TAKE 1 CAPSULE(10 MG) BY MOUTH DAILY, Disp: 90 capsule, Rfl: 3   fluticasone  (FLONASE ) 50 MCG/ACT nasal spray, Place 1 spray into both nostrils daily., Disp: 48 g, Rfl: 3   loratadine (CLARITIN) 10 MG tablet, Take 10 mg by mouth daily. , Disp: , Rfl:    olmesartan -hydrochlorothiazide (BENICAR  HCT) 40-12.5 MG tablet, Take 1 tablet by mouth daily., Disp: 90 tablet, Rfl: 3    rosuvastatin  (CRESTOR ) 10 MG tablet, Take 1 tablet (10 mg total) by mouth daily., Disp: 90 tablet, Rfl: 3   valACYclovir  (VALTREX ) 1000 MG tablet, 2 tablets BID x 2 days for flare up (Patient not taking: Reported on 07/01/2023), Disp: 30 tablet, Rfl: 2   Medications ordered in this encounter:  Meds ordered this encounter  Medications   doxycycline (VIBRA-TABS) 100 MG tablet    Sig: Take 1 tablet (100 mg total) by mouth 2 (two) times daily.    Dispense:  20 tablet    Refill:  0    Supervising Provider:   LAMPTEY, PHILIP O [[id number]]   benzonatate  (TESSALON ) 100 MG capsule    Sig: Take 1-2 capsules (100-200 mg total) by mouth 3 (three) times daily as needed.    Dispense:  30 capsule    Refill:  0    Supervising Provider:   Corine Dice [[id number]]     *If you need refills on other medications prior to your next appointment, please contact your pharmacy*  Follow-Up: Call back or seek an in-person evaluation if the symptoms worsen or if the condition fails to improve as anticipated.   Virtual Care [phone]  Other Instructions Sinus Infection, Adult A sinus infection, also called sinusitis, is inflammation of your sinuses. Sinuses are hollow spaces in the bones around your face. Your sinuses are located: Around your eyes. In the middle of your forehead. Behind your nose. In your cheekbones. Mucus normally  drains out of your sinuses. When your nasal tissues become inflamed or swollen, mucus can become trapped or blocked. This allows bacteria, viruses, and fungi to grow, which leads to infection. Most infections of the sinuses are caused by a virus. A sinus infection can develop quickly. It can last for up to 4 weeks (acute) or for more than 12 weeks (chronic). A sinus infection often develops after a cold. What are the causes? This condition is caused by anything that creates swelling in the sinuses or stops mucus from draining. This  includes: Allergies. Asthma. Infection from bacteria or viruses. Deformities or blockages in your nose or sinuses. Abnormal growths in the nose (nasal polyps). Pollutants, such as chemicals or irritants in the air. Infection from fungi. This is rare. What increases the risk? You are more likely to develop this condition if you: Have a weak body defense system (immune system). Do a lot of swimming or diving. Overuse nasal sprays. Smoke. What are the signs or symptoms? The main symptoms of this condition are pain and a feeling of pressure around the affected sinuses. Other symptoms include: Stuffy nose or congestion that makes it difficult to breathe through your nose. Thick yellow or greenish drainage from your nose. Tenderness, swelling, and warmth over the affected sinuses. A cough that may get worse at night. Decreased sense of smell and taste. Extra mucus that collects in the throat or the back of the nose (postnasal drip) causing a sore throat or bad breath. Tiredness (fatigue). Fever. How is this diagnosed? This condition is diagnosed based on: Your symptoms. Your medical history. A physical exam. Tests to find out if your condition is acute or chronic. This may include: Checking your nose for nasal polyps. Viewing your sinuses using a device that has a light (endoscope). Testing for allergies or bacteria. Imaging tests, such as an MRI or CT scan. In rare cases, a bone biopsy may be done to rule out more serious types of fungal sinus disease. How is this treated? Treatment for a sinus infection depends on the cause and whether your condition is chronic or acute. If caused by a virus, your symptoms should go away on their own within 10 days. You may be given medicines to relieve symptoms. They include: Medicines that shrink swollen nasal passages (decongestants). A spray that eases inflammation of the nostrils (topical intranasal corticosteroids). Rinses that help get rid  of thick mucus in your nose (nasal saline washes). Medicines that treat allergies (antihistamines). Over-the-counter pain relievers. If caused by bacteria, your health care provider may recommend waiting to see if your symptoms improve. Most bacterial infections will get better without antibiotic medicine. You may be given antibiotics if you have: A severe infection. A weak immune system. If caused by narrow nasal passages or nasal polyps, surgery may be needed. Follow these instructions at home: Medicines Take, use, or apply over-the-counter and prescription medicines only as told by your health care provider. These may include nasal sprays. If you were prescribed an antibiotic medicine, take it as told by your health care provider. Do not stop taking the antibiotic even if you start to feel better. Hydrate and humidify  Drink enough fluid to keep your urine pale yellow. Staying hydrated will help to thin your mucus. Use a cool mist humidifier to keep the humidity level in your home above 50%. Inhale steam for 10-15 minutes, 3-4 times a day, or as told by your health care provider. You can do this in the bathroom while  a hot shower is running. Limit your exposure to cool or dry air. Rest Rest as much as possible. Sleep with your head raised (elevated). Make sure you get enough sleep each night. General instructions  Apply a warm, moist washcloth to your face 3-4 times a day or as told by your health care provider. This will help with discomfort. Use nasal saline washes as often as told by your health care provider. Wash your hands often with soap and water to reduce your exposure to germs. If soap and water are not available, use hand sanitizer. Do not smoke. Avoid being around people who are smoking (secondhand smoke). Keep all follow-up visits. This is important. Contact a health care provider if: You have a fever. Your symptoms get worse. Your symptoms do not improve within 10  days. Get help right away if: You have a severe headache. You have persistent vomiting. You have severe pain or swelling around your face or eyes. You have vision problems. You develop confusion. Your neck is stiff. You have trouble breathing. These symptoms may be an emergency. Get help right away. Call 911. Do not wait to see if the symptoms will go away. Do not drive yourself to the hospital. Summary A sinus infection is soreness and inflammation of your sinuses. Sinuses are hollow spaces in the bones around your face. This condition is caused by nasal tissues that become inflamed or swollen. The swelling traps or blocks the flow of mucus. This allows bacteria, viruses, and fungi to grow, which leads to infection. If you were prescribed an antibiotic medicine, take it as told by your health care provider. Do not stop taking the antibiotic even if you start to feel better. Keep all follow-up visits. This is important. This information is not intended to replace advice given to you by your health care provider. Make sure you discuss any questions you have with your health care provider. Document Revised: 07/10/2021 Document Reviewed: 07/10/2021 Elsevier Patient Education  2024 [employer]   If you have been instructed to have an in-person evaluation today at a local Urgent Care facility, please use the link below. It will take you to a list of all of our available [facility], including address, phone number and hours of operation. Please do not delay care.  [facility]  If you or a family member do not have a primary care provider, use the link below to schedule a visit and establish care. When you choose a [facility] primary care physician or advanced practice provider, you gain a long-term partner in health. Find a Primary Care Provider  Learn more about [facility]'s in-office and virtual care options:  - Get Care Now

## 2024-01-28 NOTE — Progress Notes (Signed)
 The patient no-showed for appointment despite this provider sending direct link with no response and waiting for at least 10 minutes from appointment time for patient to join. They will be marked as a NS for this appointment/time.   Piedad Climes, PA-C

## 2024-02-13 ENCOUNTER — Ambulatory Visit: Payer: Self-pay

## 2024-02-13 ENCOUNTER — Institutional Professional Consult (permissible substitution): Payer: BC Managed Care – PPO | Admitting: Psychology

## 2024-02-27 ENCOUNTER — Encounter: Payer: BC Managed Care – PPO | Admitting: Psychology

## 2024-03-03 ENCOUNTER — Other Ambulatory Visit

## 2024-03-03 ENCOUNTER — Encounter: Payer: Self-pay | Admitting: Medical

## 2024-03-03 ENCOUNTER — Ambulatory Visit (INDEPENDENT_AMBULATORY_CARE_PROVIDER_SITE_OTHER): Payer: BC Managed Care – PPO | Admitting: Medical

## 2024-03-03 VITALS — BP 120/78 | HR 76 | Ht 63.5 in | Wt 161.1 lb

## 2024-03-03 DIAGNOSIS — E785 Hyperlipidemia, unspecified: Secondary | ICD-10-CM | POA: Diagnosis not present

## 2024-03-03 DIAGNOSIS — Z90710 Acquired absence of both cervix and uterus: Secondary | ICD-10-CM

## 2024-03-03 DIAGNOSIS — Z789 Other specified health status: Secondary | ICD-10-CM

## 2024-03-03 DIAGNOSIS — Z131 Encounter for screening for diabetes mellitus: Secondary | ICD-10-CM

## 2024-03-03 DIAGNOSIS — Z0000 Encounter for general adult medical examination without abnormal findings: Secondary | ICD-10-CM | POA: Diagnosis not present

## 2024-03-03 DIAGNOSIS — E559 Vitamin D deficiency, unspecified: Secondary | ICD-10-CM | POA: Diagnosis not present

## 2024-03-03 DIAGNOSIS — M810 Age-related osteoporosis without current pathological fracture: Secondary | ICD-10-CM | POA: Diagnosis not present

## 2024-03-03 DIAGNOSIS — I10 Essential (primary) hypertension: Secondary | ICD-10-CM

## 2024-03-03 NOTE — Progress Notes (Signed)
 [facility] has been updated with the immunizations we have. When patient goes to pharmacy, pharmacy can pull up [facility] registry and see what her immunizations are.

## 2024-03-03 NOTE — Progress Notes (Signed)
 Subjective:   HPI  Holly [name] is a 68 y.o. female who presents for Chief Complaint  Patient presents with   Medicare Wellness    Welcome to medicare visit. Not fasting. Would like discuss medication for weight loss    Patient Care Team: Cailyn Houdek, Alm RAMAN, PA-C as PCP - General (Family Medicine) Georjean Darice CHRISTELLA, MD as Consulting Physician (Neurology) Dina Sara E, PA-C (Neurology) Sees dentist Sees eye doctor Dr. Toribio Cedar, GI   Concerns Doing ok  Curious about medication to help with weight loss.  Several of her friends have done well with these.  She just restarted membership at [facility] and is doing weight bearing and aerobic exercise.  HTN -hypertension-compliant with medication without complaint.  Home BPs normal.  No chest pain no palpitations no edema  Hyperlipidemia-compliant with medication without complaint  Osteoporosis - she continues on Fosamax .     Reviewed their medical, surgical, family, social, medication, and allergy history and updated chart as appropriate.  Past Medical History:  Diagnosis Date   Chronic constipation    enrolled in a constipation study with [place] 01/2013, on Plecanatide 6mg  daily   Diverticulosis 01/2013   on colonoscopy   Former smoker 2018   H/O echocardiogram 03/2014   normal, Dr. Jacques Somerset   Herpetic lesion    hx/o herpetic lesion right hand and left ear? gets once to twice yearly   Hyperlipidemia    Hypertension    controlled off medication 2019   Menopausal disorder    Microscopic hematuria 04/2013   urology eval, no worrisome causes.  recheck only with frank hematuria.   Normal cardiac stress test 03/23/14   Dr. Jacques Somerset   S/P hysterectomy    due to fibroids   Sleep difficulties    due to menopause   SOB (shortness of breath)    cardiac and PFT eval 03/2014   Vitamin D  deficiency    Wears glasses     Family History  Problem Relation Age of Onset   Hyperlipidemia Mother    Hypertension Mother     Diabetes Mother    Dementia Mother    Hyperlipidemia Father    Hypertension Father    Diabetes Father    Cancer Father        Stomach   Stomach cancer Father    Stroke Brother        during surgery   Dementia Brother    Seizures Brother    Colon cancer Brother 93       Twin   Heart disease Neg Hx    Esophageal cancer Neg Hx    Rectal cancer Neg Hx      Current Outpatient Medications:    acetaminophen  (TYLENOL ) 500 MG tablet, Take 1,000 mg by mouth every 6 (six) hours as needed., Disp: , Rfl:    alendronate  (FOSAMAX ) 70 MG tablet, TAKE 1 TABLET BY MOUTH EVERY WEEK WITH A FULL GLASS OF WATER AND ON AN EMPTY STOMACH, Disp: 12 tablet, Rfl: 1   calcium  carbonate (OS-CAL) 600 MG TABS tablet, Take 600 mg by mouth daily with breakfast. , Disp: , Rfl:    Cholecalciferol (VITAMIN D ) 50 MCG (2000 UT) CAPS, Take 1 capsule (2,000 Units total) by mouth daily., Disp: 90 capsule, Rfl: 3   FLUoxetine  (PROZAC ) 10 MG capsule, TAKE 1 CAPSULE(10 MG) BY MOUTH DAILY, Disp: 90 capsule, Rfl: 3   fluticasone  (FLONASE ) 50 MCG/ACT nasal spray, Place 1 spray into both nostrils daily., Disp: 48 g, Rfl:  3   loratadine (CLARITIN) 10 MG tablet, Take 10 mg by mouth daily. , Disp: , Rfl:    olmesartan -hydrochlorothiazide (BENICAR  HCT) 40-12.5 MG tablet, Take 1 tablet by mouth daily., Disp: 90 tablet, Rfl: 3   rosuvastatin  (CRESTOR ) 10 MG tablet, Take 1 tablet (10 mg total) by mouth daily., Disp: 90 tablet, Rfl: 3   valACYclovir  (VALTREX ) 1000 MG tablet, 2 tablets BID x 2 days for flare up, Disp: 30 tablet, Rfl: 2  Allergies  Allergen Reactions   Aspirin      Upsets her stomach taking daily   Pravachol  [Pravastatin  Sodium]     Leg aches   Penicillins Rash   Review of Systems  Constitutional:  Negative for chills, fever, malaise/fatigue and weight loss.  HENT:  Negative for congestion, ear pain, hearing loss, sore throat and tinnitus.   Eyes:  Negative for blurred vision, pain and redness.  Respiratory:   Negative for cough, hemoptysis and shortness of breath.   Cardiovascular:  Negative for chest pain, palpitations, orthopnea, claudication and leg swelling.  Gastrointestinal:  Negative for abdominal pain, blood in stool, constipation, diarrhea, nausea and vomiting.  Genitourinary:  Negative for dysuria, flank pain, frequency, hematuria and urgency.  Musculoskeletal:  Negative for falls, joint pain and myalgias.  Skin:  Negative for itching and rash.  Neurological:  Negative for dizziness, tingling, speech change, weakness and headaches.  Endo/Heme/Allergies:  Negative for polydipsia. Does not bruise/bleed easily.  Psychiatric/Behavioral:  Negative for depression and memory loss. The patient is not nervous/anxious and does not have insomnia.       Objective:  BP 120/78   Pulse 76   Ht 5' 3.5 (1.613 m)   Wt 161 lb 1.6 oz (73.1 kg)   SpO2 97%   BMI 28.09 kg/m   BP Readings from Last 3 Encounters:  03/03/24 120/78  07/01/23 134/78  06/06/23 120/80   Wt Readings from Last 3 Encounters:  03/03/24 161 lb 1.6 oz (73.1 kg)  07/01/23 167 lb (75.8 kg)  06/06/23 165 lb (74.8 kg)     General appearance: alert, no distress, WD/WN, African American female Skin: right lateral upper face just lateral to eyelid with raised round brown 6mm diameter papular lesion unchanged for years.  Tattoo butterfly right lateral thigh HEENT: normocephalic, conjunctiva/corneas normal, sclerae anicteric, PERRLA, EOMi, nares patent, no discharge or erythema, pharynx normal Oral cavity: MMM, tongue normal, teeth normal Neck: supple, no lymphadenopathy, no thyromegaly, no masses, normal ROM, no bruits Chest: non tender, normal shape and expansion Heart: RRR, normal S1, S2, no murmurs Lungs: CTA bilaterally, no wheezes, rhonchi, or rales Abdomen: +bs, soft, non tender, non distended, no masses, no hepatomegaly, no splenomegaly, no bruits Back: non tender, normal ROM, no scoliosis Musculoskeletal: upper  extremities non tender, no obvious deformity, normal ROM throughout, lower extremities non tender, no obvious deformity, normal ROM throughout Extremities: no edema, no cyanosis, no clubbing Pulses: 2+ symmetric, upper and lower extremities, normal cap refill Neurological: alert, oriented x 3, CN2-12 intact, strength normal upper extremities and lower extremities, sensation normal throughout, DTRs 2+ throughout, no cerebellar signs, gait normal Psychiatric: normal affect, behavior normal, pleasant   Breast: nontender, no masses or lumps, no skin changes, no nipple discharge or inversion, no axillary lymphadenopathy Gyn: Normal external genitalia without lesions, vagina with normal mucosa, cervix without lesions, no cervical motion tenderness, no abnormal vaginal discharge.  adnexa not enlarged, nontender, no masses.   Exam chaperoned by nurse. Rectal: deferred     Assessment and Plan :  Encounter Diagnoses  Name Primary?   Welcome to Medicare preventive visit Yes   Osteoporosis, unspecified osteoporosis type, unspecified pathological fracture presence    Hyperlipidemia, unspecified hyperlipidemia type    Vitamin D  deficiency    Statin intolerance    S/P hysterectomy    Essential hypertension, benign    Screening for diabetes mellitus     Today you had a preventative care visit or wellness visit.    Topics today may have included healthy lifestyle, diet, exercise, preventative care, vaccinations, sick and well care, proper use of emergency dept and after hours care, as well as other concerns.     Recommendations: Continue to return yearly for your annual wellness and preventative care visits.  This gives us  a chance to discuss healthy lifestyle, exercise, vaccinations, review your chart record, and perform screenings where appropriate.  I recommend you see your eye doctor yearly for routine vision care.  I recommend you see your dentist yearly for routine dental care including  hygiene visits twice yearly.   Vaccination recommendations were reviewed Immunization History  Administered Date(s) Administered   Fluad Trivalent(High Dose 65+) 06/06/2023   Influenza,inj,Quad PF,6+ Mos 08/15/2016, 05/13/2017, 05/11/2018, 04/17/2019, 04/17/2019, 06/05/2021, 06/11/2022   Influenza-Unspecified 06/03/2020   PFIZER(Purple Top)SARS-COV-2 Vaccination 11/06/2019, 02/12/2020, 05/15/2020   PNEUMOCOCCAL CONJUGATE-20 06/24/2023   Pfizer Covid-19 Vaccine Bivalent Booster 69yrs & up 08/15/2021   Pneumococcal Polysaccharide-23 06/23/2012   Td 06/13/2022   Tdap 08/20/2003, 06/29/2013   Zoster Recombinant(Shingrix ) 08/28/2017, 08/21/2018     Screening for cancer: Breast cancer screening: You should perform a self breast exam monthly.   We reviewed recommendations for regular mammograms and breast cancer screening.  Colon cancer screening:  Reviewed 11/2019 colonoscopy with Dr. Teressa, polyps included Tubular Adenoma, repeat 2026  You have had a hysterectomy and no longer need pap smear test.      Skin cancer screening: Check your skin regularly for new changes, growing lesions, or other lesions of concern Come in for evaluation if you have skin lesions of concern.  Lung cancer screening: If you have a greater than 30 pack year history of tobacco use, then you qualify for lung cancer screening with a chest CT scan  We currently don't have screenings for other cancers besides breast, cervical, colon, and lung cancers.  If you have a strong family history of cancer or have other cancer screening concerns, please let me know.    Bone health: Get at least 150 minutes of aerobic exercise weekly Get weight bearing exercise at least once weekly Osteopenia with improvement in bone density on 08/2019 exam.  Plan to repeat bone density test 08/2021.    Fosamax  started 07/2017.   Discontinue Fosamax , and lets go ahead and you scheduled for updated bone density test.    Heart  health: Get at least 150 minutes of aerobic exercise weekly Limit alcohol It is important to maintain a healthy blood pressure and healthy cholesterol numbers  Consider CT coronary test    Advanced Directives: I recommend you consider completing a Health Care Power of Attorney and Living Will.   These documents respect your wishes and help alleviate burdens on your loved ones if you were to become terminally ill or be in a position to need those documents enforced.    You can complete Advanced Directives yourself, have them notarized, then have copies made for our office, for you and for anybody you feel should have them in safe keeping.  Or, you can have an attorney prepare these  documents.   If you haven't updated your Last Will and Testament in a while, it may be worthwhile having an attorney prepare these documents together and save on some costs.       Separate significant issues discussed: Osteoporosis-  I reviewed her 2023 bone density test.  Continue with weightbearing and aerobic exercise.  Hypertension-blood pressure is at goal today, continue current medication Olmesartan  HCT 40/12.5mg  daily  Hyperlipidemia-fasting labs today, continue statin Crestor  10mg  daily  Vitamin D  deficiency-updated labs today, continue supplement 2000 units daily  Doing ok on Fluoxetine  prozac  10mg  daily   Holly [name] was seen today for medicare wellness.  Diagnoses and all orders for this visit:  Welcome to Medicare preventive visit -     EKG 12-Lead -     Urinalysis, Routine w reflex microscopic  Osteoporosis, unspecified osteoporosis type, unspecified pathological fracture presence -     DG Bone Density; Future  Hyperlipidemia, unspecified hyperlipidemia type -     Comprehensive metabolic panel with GFR; Future -     Lipid panel; Future  Vitamin D  deficiency -     VITAMIN D  25 Hydroxy (Vit-D Deficiency, Fractures); Future  Statin intolerance  S/P hysterectomy  Essential  hypertension, benign -     CBC; Future  Screening for diabetes mellitus -     Hemoglobin A1c; Future   Follow-up pending labs, yearly for physical

## 2024-03-04 ENCOUNTER — Other Ambulatory Visit

## 2024-03-04 ENCOUNTER — Ambulatory Visit: Payer: Self-pay | Admitting: Medical

## 2024-03-04 DIAGNOSIS — E785 Hyperlipidemia, unspecified: Secondary | ICD-10-CM

## 2024-03-04 DIAGNOSIS — Z131 Encounter for screening for diabetes mellitus: Secondary | ICD-10-CM

## 2024-03-04 DIAGNOSIS — I10 Essential (primary) hypertension: Secondary | ICD-10-CM

## 2024-03-04 DIAGNOSIS — E559 Vitamin D deficiency, unspecified: Secondary | ICD-10-CM

## 2024-03-04 LAB — LIPID PANEL

## 2024-03-04 LAB — URINALYSIS, ROUTINE W REFLEX MICROSCOPIC
Bilirubin, UA: NEGATIVE
Glucose, UA: NEGATIVE
Ketones, UA: NEGATIVE
Nitrite, UA: NEGATIVE
Protein,UA: NEGATIVE
Specific Gravity, UA: 1.011 (ref 1.005–1.030)
Urobilinogen, Ur: 0.2 mg/dL (ref 0.2–1.0)
pH, UA: 6.5 (ref 5.0–7.5)

## 2024-03-04 LAB — MICROSCOPIC EXAMINATION
Casts: NONE SEEN /LPF
RBC, Urine: NONE SEEN /HPF (ref 0–2)

## 2024-03-05 ENCOUNTER — Other Ambulatory Visit: Payer: Self-pay | Admitting: Medical

## 2024-03-05 LAB — LIPID PANEL
Chol/HDL Ratio: 3.9 ratio (ref 0.0–4.4)
Cholesterol, Total: 151 mg/dL (ref 100–199)
HDL: 39 mg/dL — ABNORMAL LOW (ref >39)
LDL Chol Calc (NIH): 97 mg/dL (ref 0–99)
Triglycerides: 79 mg/dL (ref 0–149)
VLDL Cholesterol Cal: 15 mg/dL (ref 5–40)

## 2024-03-05 LAB — COMPREHENSIVE METABOLIC PANEL WITH GFR
ALT: 8 IU/L (ref 0–32)
AST: 15 IU/L (ref 0–40)
Albumin: 4.2 g/dL (ref 3.9–4.9)
Alkaline Phosphatase: 67 IU/L (ref 44–121)
BUN/Creatinine Ratio: 20 (ref 12–28)
BUN: 13 mg/dL (ref 8–27)
Bilirubin Total: 0.3 mg/dL (ref 0.0–1.2)
CO2: 24 mmol/L (ref 20–29)
Calcium: 9.2 mg/dL (ref 8.7–10.3)
Chloride: 105 mmol/L (ref 96–106)
Creatinine, Ser: 0.66 mg/dL (ref 0.57–1.00)
Globulin, Total: 2.6 g/dL (ref 1.5–4.5)
Glucose: 95 mg/dL (ref 70–99)
Potassium: 4.7 mmol/L (ref 3.5–5.2)
Sodium: 144 mmol/L (ref 134–144)
Total Protein: 6.8 g/dL (ref 6.0–8.5)
eGFR: 97 mL/min/1.73 (ref >59)

## 2024-03-05 LAB — HEMOGLOBIN A1C
Est. average glucose Bld gHb Est-mCnc: 120 mg/dL
Hgb A1c MFr Bld: 5.8 % — ABNORMAL HIGH (ref 4.8–5.6)

## 2024-03-05 LAB — CBC
Hematocrit: 41 % (ref 34.0–46.6)
Hemoglobin: 13.1 g/dL (ref 11.1–15.9)
MCH: 28.5 pg (ref 26.6–33.0)
MCHC: 32 g/dL (ref 31.5–35.7)
MCV: 89 fL (ref 79–97)
Platelets: 276 x10E3/uL (ref 150–450)
RBC: 4.6 x10E6/uL (ref 3.77–5.28)
RDW: 13.1 % (ref 11.7–15.4)
WBC: 7.5 x10E3/uL (ref 3.4–10.8)

## 2024-03-05 LAB — VITAMIN D 25 HYDROXY (VIT D DEFICIENCY, FRACTURES): Vit D, 25-Hydroxy: 42.4 ng/mL (ref 30.0–100.0)

## 2024-03-05 MED ORDER — VITAMIN D 50 MCG (2000 UT) PO CAPS: 1.0000 | ORAL_CAPSULE | Freq: Every day | ORAL | 3 refills | Status: AC

## 2024-03-05 MED ORDER — FLUOXETINE HCL 10 MG PO CAPS: ORAL_CAPSULE | ORAL | 3 refills | Status: AC

## 2024-03-05 MED ORDER — VALACYCLOVIR HCL 1 G PO TABS: ORAL_TABLET | ORAL | 2 refills | Status: AC

## 2024-03-05 MED ORDER — OLMESARTAN MEDOXOMIL-HCTZ 40-12.5 MG PO TABS: 1.0000 | ORAL_TABLET | Freq: Every day | ORAL | 3 refills | Status: AC

## 2024-03-05 MED ORDER — FLUTICASONE PROPIONATE 50 MCG/ACT NA SUSP: 1.0000 | Freq: Every day | NASAL | 3 refills | Status: AC

## 2024-03-05 MED ORDER — ROSUVASTATIN CALCIUM 10 MG PO TABS: 10.0000 mg | ORAL_TABLET | Freq: Every day | ORAL | 3 refills | Status: AC

## 2024-03-05 NOTE — Progress Notes (Signed)
 Diabetes marker at risk for diabetes was stable at 5.8%.  Cholesterol overall looks okay.  Vitamin D  okay.  Blood counts okay.  Liver kidney and electrolytes okay.

## 2024-04-14 LAB — HM MAMMOGRAPHY

## 2024-04-20 ENCOUNTER — Ambulatory Visit: Payer: Self-pay | Admitting: Medical

## 2024-05-20 ENCOUNTER — Ambulatory Visit: Payer: Self-pay

## 2024-05-20 ENCOUNTER — Ambulatory Visit (INDEPENDENT_AMBULATORY_CARE_PROVIDER_SITE_OTHER): Admitting: Medical

## 2024-05-20 ENCOUNTER — Encounter: Payer: Self-pay | Admitting: Medical

## 2024-05-20 VITALS — BP 120/68 | HR 68 | Ht 63.5 in | Wt 158.4 lb

## 2024-05-20 DIAGNOSIS — M25551 Pain in right hip: Secondary | ICD-10-CM | POA: Diagnosis not present

## 2024-05-20 DIAGNOSIS — M461 Sacroiliitis, not elsewhere classified: Secondary | ICD-10-CM | POA: Diagnosis not present

## 2024-05-20 DIAGNOSIS — M25552 Pain in left hip: Secondary | ICD-10-CM

## 2024-05-20 DIAGNOSIS — M25651 Stiffness of right hip, not elsewhere classified: Secondary | ICD-10-CM | POA: Diagnosis not present

## 2024-05-20 DIAGNOSIS — M7918 Myalgia, other site: Secondary | ICD-10-CM

## 2024-05-20 DIAGNOSIS — M25652 Stiffness of left hip, not elsewhere classified: Secondary | ICD-10-CM

## 2024-05-20 NOTE — Progress Notes (Signed)
 Sent to Caryn to follow-up about this

## 2024-05-20 NOTE — Telephone Encounter (Signed)
 FYI Only or Action Required?: FYI only for provider.  Patient was last seen in primary care on 03/03/2024 by Holly Alm RAMAN, PA-C.  Called Nurse Triage reporting Hip Pain.  Symptoms began this week .  Interventions attempted: Nothing.  Symptoms are: stable.  Triage Disposition: See PCP When Office is Open (Within 3 Days)  Patient/caregiver understands and will follow disposition?: yes - no appts sooner than next Tuesday Pt unable to come until Wednesday.      Copied from CRM [id number]. Topic: Clinical - Red Word Triage >> May 20, 2024  8:40 AM Donna BRAVO wrote: Red Word that prompted transfer to Nurse Triage: patient states her right hip pain, painful all week, can't hardly bend down, hurts to sit. Reason for Disposition  [1] MODERATE pain (e.g., interferes with normal activities, limping) AND [2] present > 3 days  Answer Assessment - Initial Assessment Questions 1. LOCATION and RADIATION: Where is the pain located? Does the pain spread (shoot) anywhere else?     Right hip  2. QUALITY: What does the pain feel like?  (e.g., sharp, dull, aching, burning)     aching 3. SEVERITY: How bad is the pain? What does it keep you from doing?   (Scale 1-10; or mild, moderate, severe)     5 4. ONSET: When did the pain start? Does it come and go, or is it there all the time?     This week  5. WORK OR EXERCISE: Has there been any recent work or exercise that involved this part of the body?      Stands on it much of the day  6. CAUSE: What do you think is causing the hip pain?      Nerve pain  7. AGGRAVATING FACTORS: What makes the hip pain worse? (e.g., walking, climbing stairs, running)     Standing walking  8. OTHER SYMPTOMS: Do you have any other symptoms? (e.g., back pain, pain shooting down leg,  fever, rash)     no  Protocols used: Hip Pain-A-AH

## 2024-05-20 NOTE — Telephone Encounter (Signed)
 Called and left message for pt that we do have openings if she wants to be seen sooner than next wednesday

## 2024-05-20 NOTE — Progress Notes (Signed)
 Name: Holly [name]   Date of Visit: 05/20/24   Date of last visit with me: 03/03/2024   CHIEF COMPLAINT:  Chief Complaint  Patient presents with   Hip Pain    L hip pain x 1 month and right hip started last week off and on.        HPI:  Discussed the use of AI scribe software for clinical note transcription with the patient, who gave verbal consent to proceed.  History of Present Illness  Holly [name] is a 68 year old female who presents with bilat hip pain and tingling sensations down her leg.  She has been experiencing hip pain for about a month, describing it as a 'catch' that occurs especially when lifting her 17-pound grandson. The pain is primarily located in the left hip primarily and occasionally radiates down her leg with tingling sensations, particularly at night.  She remains physically active, attending the gym and exercising Monday through Friday, which includes walking and weight lifting. Despite her activity, she experiences soreness in the hip area. She has been taking Tylenol  at night to manage the pain.  She has a history of a pinched nerve and recalls a previous discussion about restless leg syndrome. She denies significant back pain, stating that the discomfort is more localized to the hip area.  Current medications include cholesterol medication and Tylenol  for pain management. She has not been using Aleve  or ibuprofen  but is considering over-the-counter options for additional relief.  No other aggravating or relieving factors. No other complaint.  Past Medical History:  Diagnosis Date   Chronic constipation    enrolled in a constipation study with [place] 01/2013, on Plecanatide 6mg  daily   Diverticulosis 01/2013   on colonoscopy   Former smoker 2018   H/O echocardiogram 03/2014   normal, Dr. Jacques [place]   Herpetic lesion    hx/o herpetic lesion right hand and left ear? gets once to twice yearly   Hyperlipidemia    Hypertension     controlled off medication 2019   Menopausal disorder    Microscopic hematuria 04/2013   urology eval, no worrisome causes.  recheck only with frank hematuria.   Normal cardiac stress test 03/23/14   Dr. Jacques [place]   S/P hysterectomy    due to fibroids   Sleep difficulties    due to menopause   SOB (shortness of breath)    cardiac and PFT eval 03/2014   Vitamin D  deficiency    Wears glasses    Current Outpatient Medications on File Prior to Visit  Medication Sig Dispense Refill   acetaminophen  (TYLENOL ) 500 MG tablet Take 1,000 mg by mouth every 6 (six) hours as needed.     calcium  carbonate (OS-CAL) 600 MG TABS tablet Take 600 mg by mouth daily with breakfast.      Cholecalciferol (VITAMIN D ) 50 MCG (2000 UT) CAPS Take 1 capsule (2,000 Units total) by mouth daily. 90 capsule 3   FLUoxetine  (PROZAC ) 10 MG capsule TAKE 1 CAPSULE(10 MG) BY MOUTH DAILY 90 capsule 3   fluticasone  (FLONASE ) 50 MCG/ACT nasal spray Place 1 spray into both nostrils daily. 48 g 3   loratadine (CLARITIN) 10 MG tablet Take 10 mg by mouth daily.      olmesartan -hydrochlorothiazide (BENICAR  HCT) 40-12.5 MG tablet Take 1 tablet by mouth daily. 90 tablet 3   rosuvastatin  (CRESTOR ) 10 MG tablet Take 1 tablet (10 mg total) by mouth daily. 90 tablet 3   valACYclovir  (VALTREX ) 1000  MG tablet 2 tablets BID x 2 days for flare up 30 tablet 2   No current facility-administered medications on file prior to visit.   Past Surgical History:  Procedure Laterality Date   COLONOSCOPY  01/2013   external hemorrohids, severe melanosis, diverticulosis; Dr. Debrah.  prior colonoscopy Dr. Kristie.   ECTOPIC PREGNANCY SURGERY  1990   PARTIAL HYSTERECTOMY  1989   still has 1 ovary; hx/o uterine fibroids     Objective: BP 120/68   Pulse 68   Ht 5' 3.5 (1.613 m)   Wt 158 lb 6.4 oz (71.8 kg)   BMI 27.62 kg/m   Gen: wd, wn, nad MUSCULOSKELETAL: Bilateral hip range of motion mildly reduced and with tension.. Tenderness in  sacroiliac joint bilat on palpation, worse on left. Tightness in piriformis muscle bilat. No tenderness in upper back on palpation. Legs otherwise normal ROM without deformity Legs neurovascularly intact Back ROM flexion about 90degrees with pain and seemingly a little stiff and tight     Assessment and Plan Encounter Diagnoses  Name Primary?   Sacroiliitis Yes   Bilateral hip pain    Piriformis muscle pain    Decreased range of motion of both hips     Right hip pain with sacroiliac joint and piriformis involvement Chronic right hip pain with sacroiliac joint tenderness and potential piriformis muscle tightness. Differential includes sciatica, but primary concern is SI joint and piriformis involvement. - Refer to physical therapy for stretching and pain management. - Recommend Aleve  or ibuprofen  for pain relief, with Tylenol  as needed. - Consider hip and SI joint x-ray if no improvement with physical therapy. - Discuss potential referral to sports medicine specialist Dr. Vita if no improvement.  Laylia was seen today for hip pain.  Diagnoses and all orders for this visit:  Sacroiliitis -     Ambulatory referral to Physical Therapy  Bilateral hip pain -     Ambulatory referral to Physical Therapy  Piriformis muscle pain -     Ambulatory referral to Physical Therapy  Decreased range of motion of both hips -     Ambulatory referral to Physical Therapy    F/u 4mo

## 2024-05-26 ENCOUNTER — Ambulatory Visit: Admitting: Medical

## 2024-06-09 ENCOUNTER — Ambulatory Visit

## 2024-06-09 DIAGNOSIS — Z23 Encounter for immunization: Secondary | ICD-10-CM | POA: Diagnosis not present

## 2024-06-29 ENCOUNTER — Telehealth: Payer: Self-pay | Admitting: Internal Medicine

## 2024-06-29 NOTE — Telephone Encounter (Unsigned)
 Copied from CRM [id number]. Topic: Clinical - Request for Lab/Test Order >> Jun 29, 2024  9:27 AM Berwyn MATSU wrote: Reason for CRM: Patient called in requesting for a x-ray of the hip per patient she stopped therapy as she is still in pain and she feels the same.

## 2024-06-30 NOTE — Telephone Encounter (Signed)
 Left message for pt to call back to schedule a visit with Dr. Vita

## 2024-09-06 ENCOUNTER — Ambulatory Visit: Payer: Self-pay | Admitting: Medical

## 2024-09-06 VITALS — BP 130/82 | HR 73 | Wt 164.6 lb

## 2024-09-06 DIAGNOSIS — M461 Sacroiliitis, not elsewhere classified: Secondary | ICD-10-CM

## 2024-09-06 DIAGNOSIS — M25552 Pain in left hip: Secondary | ICD-10-CM | POA: Diagnosis not present

## 2024-09-06 DIAGNOSIS — M25551 Pain in right hip: Secondary | ICD-10-CM | POA: Diagnosis not present

## 2024-09-06 DIAGNOSIS — E559 Vitamin D deficiency, unspecified: Secondary | ICD-10-CM

## 2024-09-06 DIAGNOSIS — L989 Disorder of the skin and subcutaneous tissue, unspecified: Secondary | ICD-10-CM

## 2024-09-06 DIAGNOSIS — Z1231 Encounter for screening mammogram for malignant neoplasm of breast: Secondary | ICD-10-CM

## 2024-09-06 DIAGNOSIS — N951 Menopausal and female climacteric states: Secondary | ICD-10-CM

## 2024-09-06 DIAGNOSIS — Z780 Asymptomatic menopausal state: Secondary | ICD-10-CM

## 2024-09-06 DIAGNOSIS — L816 Other disorders of diminished melanin formation: Secondary | ICD-10-CM | POA: Diagnosis not present

## 2024-09-06 MED ORDER — CLONIDINE HCL 0.1 MG PO TABS: 0.1000 mg | ORAL_TABLET | Freq: Three times a day (TID) | ORAL | 1 refills | Status: AC

## 2024-09-06 NOTE — Progress Notes (Signed)
 I have reordered this for it to go to [facility]

## 2024-09-06 NOTE — Progress Notes (Signed)
 "  Name: Holly [name]   Date of Visit: 09/06/24   Date of last visit with me: 05/20/2024   CHIEF COMPLAINT:  Chief Complaint  Patient presents with   Medical Management of Chronic Issues    6 month med check. Needs a referral to dermatology due to skin spots.  Wants bone density test       HPI:  Discussed the use of AI scribe software for clinical note transcription with the patient, who gave verbal consent to proceed.  History of Present Illness   Holly [name] is a 69 year old female who presents with follow-up for hip pain and concerns about skin changes.  She has chronic hip pain that worsens in cold weather and after prolonged sitting.  She saw me for this 05/2024. She went to physical therapy for six weeks was beneficial, but she has not completed a follow-up. She engages in regular physical activity, attending the gym five days a week, but has reduced her activity level due to discomfort. Walking helps alleviate stiffness after sitting for extended periods.  She has noticed skin changes, including dry skin and 'little white' breakouts on right upper arm. There is darkening around her ankles, which she questions if it is related to dry skin or another condition. Her back itches frequently, but she cannot see it. The skin changes are isolated to her right arm, with no similar changes on her chest or other areas.   She has not had a bone density test done despite an order being placed in July. Her mammogram in August was normal. Her current medications include Crestor  for cholesterol, olmesartan  40/12.5 mg for blood pressure, and fluoxetine  10 mg daily for mood. She also takes a vitamin D  supplement. Her sugar marker was noted to be borderline in July.  She complains about abnormal hot flashes, particular at night.  No other aggravating or relieving factors. No other complaint.   Past Medical History:  Diagnosis Date   Chronic constipation    enrolled in a constipation  study with [facility] 01/2013, on Plecanatide 6mg  daily   Diverticulosis 01/2013   on colonoscopy   Former smoker 2018   H/O echocardiogram 03/2014   normal, Dr. Jacques Somerset   Herpetic lesion    hx/o herpetic lesion right hand and left ear? gets once to twice yearly   Hyperlipidemia    Hypertension    controlled off medication 2019   Menopausal disorder    Microscopic hematuria 04/2013   urology eval, no worrisome causes.  recheck only with frank hematuria.   Normal cardiac stress test 03/23/14   Dr. Jacques Somerset   S/P hysterectomy    due to fibroids   Sleep difficulties    due to menopause   SOB (shortness of breath)    cardiac and PFT eval 03/2014   Vitamin D  deficiency    Wears glasses    Medications Ordered Prior to Encounter[1]  ROS as in subjective   Objective: BP 130/82   Pulse 73   Wt 164 lb 9.6 oz (74.7 kg)   SpO2 98%   BMI 28.70 kg/m   Gen: wd, wn, nad Right upper arm chest proximal to the elbow with 2 areas of lighter colored hyperpigmentation with some rash in the skin suggestive of possible tinea.  There is faint darker coloration of the skin of the ankles and feet but not particularly obvious.  No specific rash with the general darkening of the skin No extremity edema or  varicose veins or obvious MSK: Right hip with slight decrease in range of motion but otherwise bilateral hip range of motion mostly full, legs nontender Back nontender today    Assessment: Encounter Diagnoses  Name Primary?   Bilateral hip pain Yes   Skin hypopigmentation    Skin lesion    Sacroiliitis    Vitamin D  deficiency    Postmenopausal estrogen deficiency    Hot flash, menopausal      Plan: Skin lesion Hypopigmented area on right arm, likely fungal. Differential includes fungal infection and vitiligo. - Apply OTC antifungal cream (Lamisil or Tinactin) for 2 weeks.  Hip and sacroiliac joint pain Pain with improvement post-physical therapy. Exacerbated by cold and  overexertion. Possible piriformis and sacroiliac joint involvement. - Continue regular exercise and stretching. - Consider sports medicine referral if symptoms persist or worsen.  She will consider at let me know.  Osteoporosis screening Bone density test ordered but not completed. - we will check into status of scheduling bone density test that was ordered months ago  Vitamin D  deficiency -Continue multivitamin daily over-the-counter -Continue vitamin D  supplement -Counseled on general diet recommendations including healthy fats, fruits, vegetables and water intake  Postmenopausal estrogen deficiency and hot flashes -Begin trial of clonidine  nightly.  Discussed risk of benefits and proper use of medication. -Consider [facility] as an option if not seeing improvement on clonidine    Holly [name] was seen today for medical management of chronic issues.  Diagnoses and all orders for this visit:  Bilateral hip pain  Skin hypopigmentation  Skin lesion  Sacroiliitis  Vitamin D  deficiency  Postmenopausal estrogen deficiency  Hot flash, menopausal    F/u for yearly well visit       [1]  Current Outpatient Medications on File Prior to Visit  Medication Sig Dispense Refill   acetaminophen  (TYLENOL ) 500 MG tablet Take 1,000 mg by mouth every 6 (six) hours as needed.     calcium  carbonate (OS-CAL) 600 MG TABS tablet Take 600 mg by mouth daily with breakfast.      Cholecalciferol (VITAMIN D ) 50 MCG (2000 UT) CAPS Take 1 capsule (2,000 Units total) by mouth daily. 90 capsule 3   FLUoxetine  (PROZAC ) 10 MG capsule TAKE 1 CAPSULE(10 MG) BY MOUTH DAILY 90 capsule 3   fluticasone  (FLONASE ) 50 MCG/ACT nasal spray Place 1 spray into both nostrils daily. 48 g 3   loratadine (CLARITIN) 10 MG tablet Take 10 mg by mouth daily.      olmesartan -hydrochlorothiazide (BENICAR  HCT) 40-12.5 MG tablet Take 1 tablet by mouth daily. 90 tablet 3   rosuvastatin  (CRESTOR ) 10 MG tablet Take 1 tablet (10 mg  total) by mouth daily. 90 tablet 3   valACYclovir  (VALTREX ) 1000 MG tablet 2 tablets BID x 2 days for flare up 30 tablet 2   No current facility-administered medications on file prior to visit.   "

## 2024-09-06 NOTE — Progress Notes (Signed)
 Sent to [facility] to schedule her an appointment

## 2024-09-06 NOTE — Addendum Note (Signed)
 Addended by: VICCI HUSBAND A on: 09/06/2024 10:52 AM   Modules accepted: Orders

## 2024-09-06 NOTE — Progress Notes (Signed)
 Order was not placed for [facility] as external. It was sent to [facility] for imaging. I have reordered this to go to [facility]

## 2024-09-06 NOTE — Patient Instructions (Signed)
 VISIT SUMMARY:  During your visit, we discussed your chronic hip pain and skin changes. We reviewed your physical therapy progress and addressed your concerns about skin changes on your right arm and around your ankles.  YOUR PLAN:  SKIN LESION: You have a hypopigmented area on your right arm, which is likely a fungal infection. -Apply over-the-counter antifungal cream (Lamisil or Tinactin) to the affected area for 2 weeks.  HIP AND SACROILIAC JOINT PAIN: Your hip pain has improved with physical therapy but worsens in cold weather and after prolonged sitting. -Continue regular exercise and stretching. -Consider returning to see Dr. Vita here sports medicine specialist if your symptoms persist or worsen.  OSTEOPOROSIS SCREENING: You need to complete your bone density test, which was ordered in July. -we will call and inquire about the scheduling  Leg coloration likely due to venous changes -consider wearing compression sock or hose daily -exercise daily -use moisturizing lotion -continue getting healthy fats in the diet -continue multivitamin

## 2024-09-13 ENCOUNTER — Ambulatory Visit: Admitting: Family Medicine

## 2025-03-14 ENCOUNTER — Ambulatory Visit: Payer: Self-pay | Admitting: Medical
